# Patient Record
Sex: Female | Born: 1990 | Race: Asian | Hispanic: No | Marital: Married | Smoking: Never smoker
Health system: Southern US, Community
[De-identification: ages and names within clinical notes are randomized; demographics above are authoritative.]

## PROBLEM LIST (undated history)

## (undated) DIAGNOSIS — Z8632 Personal history of gestational diabetes: Secondary | ICD-10-CM

## (undated) DIAGNOSIS — Z789 Other specified health status: Secondary | ICD-10-CM

## (undated) DIAGNOSIS — O24419 Gestational diabetes mellitus in pregnancy, unspecified control: Secondary | ICD-10-CM

## (undated) HISTORY — DX: Personal history of gestational diabetes: Z86.32

## (undated) HISTORY — PX: NO PAST SURGERIES: SHX2092

## (undated) HISTORY — DX: Gestational diabetes mellitus in pregnancy, unspecified control: O24.419

## (undated) HISTORY — DX: Other specified health status: Z78.9

---

## 2015-06-03 LAB — OB RESULTS CONSOLE HIV ANTIBODY (ROUTINE TESTING): HIV: NONREACTIVE

## 2015-06-03 LAB — OB RESULTS CONSOLE ANTIBODY SCREEN: ANTIBODY SCREEN: NEGATIVE

## 2015-06-03 LAB — OB RESULTS CONSOLE HGB/HCT, BLOOD
HEMATOCRIT: 38 %
HEMOGLOBIN: 12.1 g/dL

## 2015-06-03 LAB — OB RESULTS CONSOLE ABO/RH: RH Type: POSITIVE

## 2015-06-03 LAB — OB RESULTS CONSOLE RUBELLA ANTIBODY, IGM: Rubella: NON-IMMUNE/NOT IMMUNE

## 2015-06-03 LAB — OB RESULTS CONSOLE PLATELET COUNT: Platelets: 235 10*3/uL

## 2015-06-03 LAB — OB RESULTS CONSOLE RPR: RPR: NONREACTIVE

## 2015-06-03 LAB — OB RESULTS CONSOLE VARICELLA ZOSTER ANTIBODY, IGG: Varicella: NON-IMMUNE/NOT IMMUNE

## 2015-06-03 LAB — OB RESULTS CONSOLE HEPATITIS B SURFACE ANTIGEN: Hepatitis B Surface Ag: NEGATIVE

## 2015-06-03 LAB — OB RESULTS CONSOLE GC/CHLAMYDIA
Chlamydia: NEGATIVE
Gonorrhea: NEGATIVE

## 2015-07-12 ENCOUNTER — Encounter (HOSPITAL_COMMUNITY): Payer: Self-pay

## 2015-07-12 ENCOUNTER — Other Ambulatory Visit (HOSPITAL_COMMUNITY): Payer: Self-pay | Admitting: Nurse Practitioner

## 2015-07-12 ENCOUNTER — Ambulatory Visit (HOSPITAL_COMMUNITY)
Admission: RE | Admit: 2015-07-12 | Discharge: 2015-07-12 | Disposition: A | Discharge status: Home / Self Care | Payer: Medicaid Other | Source: Ambulatory Visit | Attending: Physician Assistant | Admitting: Physician Assistant

## 2015-07-12 DIAGNOSIS — O351XX0 Maternal care for (suspected) chromosomal abnormality in fetus, not applicable or unspecified: Secondary | ICD-10-CM | POA: Insufficient documentation

## 2015-07-12 DIAGNOSIS — Z3A19 19 weeks gestation of pregnancy: Secondary | ICD-10-CM

## 2015-07-12 DIAGNOSIS — Z315 Encounter for genetic counseling: Secondary | ICD-10-CM | POA: Insufficient documentation

## 2015-07-12 DIAGNOSIS — O283 Abnormal ultrasonic finding on antenatal screening of mother: Secondary | ICD-10-CM | POA: Diagnosis not present

## 2015-07-12 DIAGNOSIS — Z3689 Encounter for other specified antenatal screening: Secondary | ICD-10-CM

## 2015-07-12 DIAGNOSIS — Z843 Family history of consanguinity: Secondary | ICD-10-CM

## 2015-07-12 DIAGNOSIS — IMO0002 Reserved for concepts with insufficient information to code with codable children: Secondary | ICD-10-CM | POA: Insufficient documentation

## 2015-07-12 NOTE — Progress Notes (Signed)
Genetic Counseling  High-Risk Gestation Note  Appointment Date:  07/12/2015 Referred By: Quentin Mulling, PA-C Date of Birth:  1991-04-20   Pregnancy History: G1P0 Estimated Date of Delivery: 12/05/15 Estimated Gestational Age: [redacted]w[redacted]d Attending: Alpha Gula, MD   Ms. [employer] and her partner were seen for genetic counseling because of the ultrasound finding of increased nuchal fold measurement on today's ultrasound.   In Summary:   Nuchal fold measurement increased on today's ultrasound (7.3 mm)  Discussed this is a soft marker for Down syndrome  Reviewed age-related risks for fetal aneuploidy and previous Quad screen for patient, which indicated 1 in 6,369 DSR  Adjusted Down syndrome risk is approximately 1 in 530.   Discussed screening and testing options  Patient declined NIPS and amniocentesis  Family history significant for consanguinity (second cousins); patient declined expanded carrier screening panel today  Follow-up ultrasound scheduled for 08/09/15  Targeted ultrasound was performed today. Ultrasound revealed an increased nuchal fold measurement (7.3 mm at [redacted]w[redacted]d gestation). Additionally, the left thumb was visualized to be hyperextended on today's ultrasound. The ultrasound report will be sent under separate cover.  Follow-up ultrasound is scheduled for 08/09/15.   We discussed that the second trimester genetic sonogram is targeted at identifying features associated with aneuploidy.  It has evolved as a screening tool used to provide an individualized risk assessment for Down syndrome and other trisomies.  The ability of sonography to aid in the detection of aneuploidies relies on identification of both major structural anomalies and "soft markers."  The patient was counseled that the latter term refers to findings that are often normal variants and do not cause any significant medical problems.  Nonetheless, these markers have a known association with aneuploidy.     The nuchal fold refers to the skin on the back of the fetal neck. A thick nuchal fold measurement is typically defined as greater than 5 mm at 15-[redacted] weeks gestation and greater than 6 mm at 18 through [redacted] weeks gestation. Approximately 1-2% of normal pregnancies will have a thick nuchal fold; therefore, most babies with this finding are born normal and healthy.  However, the presence of a thick nuchal fold increases the chance for Down syndrome in a pregnancy.  We discussed that this finding would increase the chance for Down syndrome above the patient's Quad screen risk of 1 in 6,369 to approximately 1 in 530 (0.2%).   We reviewed chromosomes, nondisjunction, and the common features and variable prognosis of Down syndrome.  We also reviewed other aneuploidies including trisomies 13 and 9; however, we discussed that these conditions are less likely given that the remainder of the fetal anatomy was wnl by ultrasound.  We reviewed other available screening and diagnostic options including noninvasive prenatal screening (NIPS)/cell free DNA (cfDNA) testing and amniocentesis.  She was counseled regarding the benefits and limitations of each option.  We reviewed the approximate 1 in 300-500 risk for complications for amniocentesis, including spontaneous pregnancy loss. After consideration of all the options, she declined NIPS and amniocentesis today, stating that she was comfortable with the risk assessment provided by Quad screen and ultrasound. She understands that these screens do not diagnose or rule out chromosome conditions.   Both family histories were reviewed and found to be contributory for a female maternal first cousin to the patient with congenital heart disease, and this relative died after a few months of life. Congenital heart defects occur in approximately 1% of pregnancies.  Congenital heart defects may occur due  to multifactorial influences, chromosomal abnormalities, genetic syndromes or  environmental exposures.  Isolated heart defects are generally multifactorial.  Given the reported family history and assuming multifactorial inheritance, the risk for a congenital heart defect in the current pregnancy does not appear to be increased above the general population risk.  Additionally, the father of the pregnancy reported a female paternal first cousin once removed who was described to not speak well due to not hearing well. He is currently 26 years old and reportedly recently started therapy, which is reported to improve his features. An underlying cause was not known by the patient at the time of today's visit for the hearing issues. We discussed that there can be various causes for hearing loss including environment, genetics, multifactorial, or sporadic. Given the degree of relation and reported family history, recurrence risk would likely be low for the current pregnancy. However, specific information regarding the etiology is needed to accurately assess recurrence risk for relatives.    The couple reported that they are maternal second cousins; the patient's maternal grandfather and the father of the pregnancy's maternal grandmother are full siblings. We discussed that children born to a consanguineous couple are at increased risk for genetic health problems.  This increase in risk is related to the possibility of passing on recessive genes. We explained that every person carries approximately 7-10 non-working genes that when received in a double dose results in recessive genetic conditions.  In general, unrelated couples have a relatively low risk of having a child with a recessive condition because the likelihood of both parents carrying the same non-working recessive gene is very low.  However, when a couple is related, they have inherited some of their genetic information from the same family member, which leads to an increased chance that they may carry the same recessive gene and have a  child with a recessive condition.  For second cousin unions, the risk to have a child with a birth defect, intellectual disability, or genetic condition is increased approximately 1% above the general population risk (3-5%).  We reviewed chromosomes, genes, and recessive inheritance in detail. Without further information regarding the provided family history, an accurate genetic risk cannot be calculated. Further genetic counseling is warranted if more information is obtained.  We discussed the option of expanded carrier screening, which provides carrier screening for common disease-causing mutations of greater than 200 mostly autosomal recessive conditions. We reviewed that the prevalence of each condition varies (and often varies with ethnicity). Thus the couples' background risk to be a carrier for each of these various conditions would range, and in some cases be very low or unknown. Similarly, the detection rate varies with each condition and also varies in some cases with ethnicity, ranging from greater than 99% (in the case of Hemoglobinopathies) to 11% to unknown. We reviewed that a negative carrier screen would thus reduce but not eliminate the chance to be a carrier for these conditions.  For some conditions included on this carrier screening panel, the pre-test carrier frequency and/or the detection rate is unknown. Thus, for some conditions included on the carrier screening panel, the exact reduction of risk with a negative carrier screening result may not be able to be quantified. We reviewed that in the event that one partner is found to be a carrier for one or more conditions, carrier screening would be available to the partner for those conditions. We discussed the risks, benefits, and limitations of carrier screening with the couple. After careful consideration, Ms.  Wan declined expanded carrier screening today.   Ms. Nedda Gains denied exposure to environmental toxins or chemical agents. She  denied the use of alcohol, tobacco or street drugs. She denied significant viral illnesses during the course of her pregnancy. Her medical and surgical histories were noncontributory.   I counseled this couple regarding the above risks and available options.  The approximate face-to-face time with the genetic counselor was 35 minutes.  Quinn Plowman, MS Certified Genetic Counselor 07/12/2015

## 2015-08-09 ENCOUNTER — Ambulatory Visit (HOSPITAL_COMMUNITY)
Admission: RE | Admit: 2015-08-09 | Discharge: 2015-08-09 | Disposition: A | Discharge status: Home / Self Care | Payer: Medicaid Other | Source: Ambulatory Visit | Attending: Physician Assistant | Admitting: Physician Assistant

## 2015-08-09 DIAGNOSIS — O283 Abnormal ultrasonic finding on antenatal screening of mother: Secondary | ICD-10-CM | POA: Diagnosis not present

## 2015-10-04 ENCOUNTER — Encounter: Payer: Self-pay | Admitting: *Deleted

## 2015-10-04 ENCOUNTER — Ambulatory Visit: Payer: Medicaid Other | Admitting: *Deleted

## 2015-10-04 ENCOUNTER — Encounter: Payer: Medicaid Other | Attending: Obstetrics and Gynecology | Admitting: *Deleted

## 2015-10-04 VITALS — Ht 63.0 in | Wt 135.2 lb

## 2015-10-04 DIAGNOSIS — Z2839 Other underimmunization status: Secondary | ICD-10-CM | POA: Insufficient documentation

## 2015-10-04 DIAGNOSIS — Z713 Dietary counseling and surveillance: Secondary | ICD-10-CM | POA: Diagnosis not present

## 2015-10-04 DIAGNOSIS — O24419 Gestational diabetes mellitus in pregnancy, unspecified control: Secondary | ICD-10-CM

## 2015-10-04 DIAGNOSIS — O9989 Other specified diseases and conditions complicating pregnancy, childbirth and the puerperium: Secondary | ICD-10-CM

## 2015-10-04 DIAGNOSIS — IMO0002 Reserved for concepts with insufficient information to code with codable children: Secondary | ICD-10-CM

## 2015-10-04 DIAGNOSIS — O0990 Supervision of high risk pregnancy, unspecified, unspecified trimester: Secondary | ICD-10-CM | POA: Insufficient documentation

## 2015-10-04 DIAGNOSIS — Z843 Family history of consanguinity: Secondary | ICD-10-CM

## 2015-10-04 DIAGNOSIS — O351XX0 Maternal care for (suspected) chromosomal abnormality in fetus, not applicable or unspecified: Secondary | ICD-10-CM

## 2015-10-04 DIAGNOSIS — O09899 Supervision of other high risk pregnancies, unspecified trimester: Secondary | ICD-10-CM | POA: Insufficient documentation

## 2015-10-04 DIAGNOSIS — Z283 Underimmunization status: Secondary | ICD-10-CM | POA: Insufficient documentation

## 2015-10-04 MED ORDER — ACCU-CHEK FASTCLIX LANCETS MISC: 1.0000 | Freq: Four times a day (QID) | Status: DC

## 2015-10-04 MED ORDER — GLUCOSE BLOOD VI STRP: ORAL_STRIP | Status: DC

## 2015-10-04 NOTE — Progress Notes (Signed)
Nutrition note: GDM diet education Pt was recently diagnosed with GDM. Pt was underwt prior to pregnancy (pregravid wt-87#). Pt has gained 48.2# @ 32w, which is > expected. Pt reports eating 3 meals & 6 snacks/d. Pt is taking a PNV. Pt reports no N&V or heartburn. NKFA. Pt reports walking for ~15 mins/d. Pt received verbal & written education on GDM diet. Discussed wt gain goals of 28-40# or 1#/wk. Pt agrees to follow GDM diet with 3 meals & 3 snacks/d with proper CHO/ protein combination. Pt has WIC & plans to BF. F/u in 4-6 wks Blondell RevealLaura Farron Watrous, MS, RD, LDN, [facility]CLC

## 2015-10-04 NOTE — Progress Notes (Signed)
  Patient was seen on 10/04/15 for Gestational Diabetes self-management . The following learning objectives were met by the patient :   States the definition of Gestational Diabetes  States when to check blood glucose levels  Demonstrates proper blood glucose monitoring techniques  States the effect of stress and exercise on blood glucose levels  States the importance of limiting caffeine and abstaining from alcohol and smoking  Plan:  Consider  increasing your activity level by walking daily as tolerated Begin checking BG before breakfast and 2 hours after first bit of breakfast, lunch and dinner after  as directed by MD  Take medication  as directed by MD  Blood glucose monitor given: Georgeanne Nim Lot # [id number] Exp: 2016/07/15 Blood glucose reading: 2hpp 109m/dl  Patient instructed to monitor glucose levels: FBS: 60 - <90 2 hour: <120  Patient received the following handouts:  Nutrition Diabetes and Pregnancy  Carbohydrate Counting List  Meal Planning worksheet  Patient will be seen for follow-up as needed.

## 2015-10-06 ENCOUNTER — Other Ambulatory Visit: Payer: Self-pay | Admitting: General Practice

## 2015-10-06 ENCOUNTER — Telehealth: Payer: Self-pay | Admitting: General Practice

## 2015-10-06 DIAGNOSIS — O24419 Gestational diabetes mellitus in pregnancy, unspecified control: Secondary | ICD-10-CM

## 2015-10-06 MED ORDER — GLUCOSE BLOOD VI STRP: ORAL_STRIP | Status: DC

## 2015-10-06 MED ORDER — ACCU-CHEK FASTCLIX LANCETS MISC: 1.0000 | Freq: Four times a day (QID) | Status: DC

## 2015-10-06 NOTE — Telephone Encounter (Signed)
Patient called and left message stating she is calling about the machine we gave her. Patient states she is all out of lancets and strips and they are no prescriptions at [facility]. Per chart review, no pharmacy on file. Called patient, no answer- left message stating we are trying to reach you to return your phone call. Please call us back and let us know what pharmacy you use.

## 2015-10-06 NOTE — Telephone Encounter (Signed)
Patient called and left message stating [facility] on [address] and needs her needles and strips sent there. meds reordered there. Called patient and informed her of prescriptions sent to pharmacy. Patient verbalized understanding & had no questions

## 2015-10-13 ENCOUNTER — Encounter: Payer: Self-pay | Admitting: Obstetrics and Gynecology

## 2015-10-13 ENCOUNTER — Ambulatory Visit (INDEPENDENT_AMBULATORY_CARE_PROVIDER_SITE_OTHER): Payer: Medicaid Other | Admitting: Obstetrics and Gynecology

## 2015-10-13 VITALS — BP 106/61 | HR 94 | Temp 97.8°F | Wt 141.0 lb

## 2015-10-13 DIAGNOSIS — O24419 Gestational diabetes mellitus in pregnancy, unspecified control: Secondary | ICD-10-CM

## 2015-10-13 DIAGNOSIS — O351XX0 Maternal care for (suspected) chromosomal abnormality in fetus, not applicable or unspecified: Secondary | ICD-10-CM

## 2015-10-13 DIAGNOSIS — O0990 Supervision of high risk pregnancy, unspecified, unspecified trimester: Secondary | ICD-10-CM

## 2015-10-13 DIAGNOSIS — IMO0002 Reserved for concepts with insufficient information to code with codable children: Secondary | ICD-10-CM

## 2015-10-13 DIAGNOSIS — Z843 Family history of consanguinity: Secondary | ICD-10-CM

## 2015-10-13 LAB — POCT URINALYSIS DIP (DEVICE)
Bilirubin Urine: NEGATIVE
Glucose, UA: NEGATIVE mg/dL
KETONES UR: NEGATIVE mg/dL
Nitrite: NEGATIVE
Protein, ur: NEGATIVE mg/dL
SPECIFIC GRAVITY, URINE: 1.02 (ref 1.005–1.030)
UROBILINOGEN UA: 0.2 mg/dL (ref 0.0–1.0)
pH: 7 (ref 5.0–8.0)

## 2015-10-13 MED ORDER — GLYBURIDE 2.5 MG PO TABS: 1.2500 mg | ORAL_TABLET | Freq: Every day | ORAL | Status: DC

## 2015-10-13 NOTE — Progress Notes (Signed)
Subjective:  Maureen Cameron is a 24 y.o. G1P0000 at 3125w3d being seen today for initial. prenatal care. [facility] Failed 3 hour. No questions or concerns.  Fastings are less than 90. 16 of 24 2 hour PPs are > 120. 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  Patient reports no complaints.  Contractions: Not present. Vag. Bleeding: None.  Movement: Present. Denies leaking of fluid.   The following portions of the patient's history were reviewed and updated as appropriate: allergies, current medications, past family history, past medical history, past social history, past surgical history and problem list. Problem list updated.  Objective:   Filed Vitals:   10/13/15 0800  BP: 106/61  Pulse: 94  Temp: 97.8 F (36.6 C)  Weight: 141 lb (63.957 kg)    Fetal Status: Fetal Heart Rate (bpm): 148 Fundal Height: 33 cm Movement: Present     General:  Alert, oriented and cooperative. Patient is in no acute distress.  Skin: Skin is warm and dry. No rash noted.   Cardiovascular: Normal heart rate noted  Respiratory: Normal respiratory effort, no problems with respiration noted  Abdomen: Soft, gravid, appropriate for gestational age. Pain/Pressure: Absent     Pelvic: Vag. Bleeding: None     Cervical exam deferred        Extremities: Normal range of motion.  Edema: Trace  Mental Status: Normal mood and affect. Normal behavior. Normal judgment and thought content.   Urinalysis:      Assessment and Plan:  Pregnancy: G1P0000 at 7425w3d  #A2GDM - at dx today for elevated PPs - starting glyburide 1.25 mg po qd - 2x weekly nst starting today - diabetes educator 1 week - HR OB 2 weeks   Preterm labor symptoms and general obstetric precautions  including but not limited to vaginal bleeding, contractions, leaking of fluid and fetal movement were reviewed in detail with the patient. Please refer to After Visit Summary for other counseling recommendations.    Kathrynn RunningNoah Bedford Miu Chiong, MD

## 2015-10-18 ENCOUNTER — Encounter: Payer: Medicaid Other | Attending: Obstetrics and Gynecology | Admitting: *Deleted

## 2015-10-18 ENCOUNTER — Ambulatory Visit (INDEPENDENT_AMBULATORY_CARE_PROVIDER_SITE_OTHER): Payer: Medicaid Other | Admitting: Obstetrics & Gynecology

## 2015-10-18 VITALS — BP 111/71 | HR 89 | Wt 142.8 lb

## 2015-10-18 DIAGNOSIS — Z36 Encounter for antenatal screening of mother: Secondary | ICD-10-CM

## 2015-10-18 DIAGNOSIS — O24419 Gestational diabetes mellitus in pregnancy, unspecified control: Secondary | ICD-10-CM | POA: Diagnosis not present

## 2015-10-18 DIAGNOSIS — O0990 Supervision of high risk pregnancy, unspecified, unspecified trimester: Secondary | ICD-10-CM

## 2015-10-18 DIAGNOSIS — Z713 Dietary counseling and surveillance: Secondary | ICD-10-CM | POA: Diagnosis not present

## 2015-10-18 MED ORDER — GLYBURIDE 2.5 MG PO TABS: ORAL_TABLET | ORAL | Status: DC

## 2015-10-18 NOTE — Progress Notes (Signed)
Patient presents for review of glucose readings. I reviewed the patient log and glucometer for validity. For the past seven days 15:28 glucose readings were documented incorrectly. FBS readings were all valid with range from 73-85mg /dl. 2hpp readings are 69-200. All documented readings were less than the actual glucometer reading. The patient had changed the administration time of her glyburide from breakfast to lunch. Patient was not having AM snack and was symptomatic at noon. I have reviewed the testing and documentation process. I also reviewed her dietary intake to find she is drinking much [facility]. Plan: Glyburide with breakfast [place] 3 meals a day + snacks Always have Protein with your meals and snacks No fruit juice Test Fasting - befor breakfast ideal readings 60-90mg /dl Test: 2 hours after first bite of breakfast, lunch and dinner with an ultimate goal of 120mg /dl  Glucose readings to be reviewed this Friday and again Monday at OV.

## 2015-10-18 NOTE — Progress Notes (Signed)
NST reactive  Subjective: had sx of low blood sugar when she took med at breakfast, switched to noontime with improvement  Maureen Cameron is a 25 y.o. G1P0000 at 5074w1d being seen today for ongoing prenatal care.  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  Patient reports no contractions.  Contractions: Not present. Vag. Bleeding: None.  Movement: Present. Denies leaking of fluid.   The following portions of the patient's history were reviewed and updated as appropriate: allergies, current medications, past family history, past medical history, past social history, past surgical history and problem list. Problem list updated.  Objective:   Filed Vitals:   10/18/15 1123  BP: 111/71  Pulse: 89  Weight: 142 lb 12.8 oz (64.774 kg)    Fetal Status:     Movement: Present     General:  Alert, oriented and cooperative. Patient is in no acute distress.  Skin: Skin is warm and dry. No rash noted.   Cardiovascular: Normal heart rate noted  Respiratory: Normal respiratory effort, no problems with respiration noted  Abdomen: Soft, gravid, appropriate for gestational age. Pain/Pressure: Absent     Pelvic: Vag. Bleeding: None     Cervical exam deferred        Extremities: Normal range of motion.  Edema: Trace  Mental Status: Normal mood and affect. Normal behavior. Normal judgment and thought content.   Urinalysis:      Assessment and Plan:  Pregnancy: G1P0000 at 1174w1d  1. Gestational diabetes mellitus (GDM), antepartum, gestational diabetes method of control unspecified FBS and PP within range - glyBURIDE (DIABETA) 2.5 MG tablet; Take 0.5 tablets daily with lunch  Dispense: 30 tablet; Refill: 3  2. Supervision of high-risk pregnancy, unspecified  trimester Good BG control taking her medication midday with continue for now but needs to review her diet - glyBURIDE (DIABETA) 2.5 MG tablet; Take 0.5 tablets daily with lunch  Dispense: 30 tablet; Refill: 3  Preterm labor symptoms and general obstetric precautions including but not limited to vaginal bleeding, contractions, leaking of fluid and fetal movement were reviewed in detail with the patient. Please refer to After Visit Summary for other counseling recommendations.  Return in about 1 week (around 10/25/2015).   Adam PhenixJames G Arnold, MD

## 2015-10-18 NOTE — Patient Instructions (Signed)

## 2015-10-18 NOTE — Progress Notes (Signed)
Pt states she has been having low CBG's 2hr after breakfast since starting Glyburide. She took the medication after lunch for the past 2 days and has not been having the problem of low blood sugar. Per brief review of diet, pt may not be getting enough calories @ breakfast.

## 2015-10-22 ENCOUNTER — Ambulatory Visit (INDEPENDENT_AMBULATORY_CARE_PROVIDER_SITE_OTHER): Payer: Medicaid Other | Admitting: *Deleted

## 2015-10-22 VITALS — BP 117/71 | HR 90

## 2015-10-22 DIAGNOSIS — O24419 Gestational diabetes mellitus in pregnancy, unspecified control: Secondary | ICD-10-CM

## 2015-10-22 DIAGNOSIS — O0990 Supervision of high risk pregnancy, unspecified, unspecified trimester: Secondary | ICD-10-CM

## 2015-10-22 MED ORDER — GLYBURIDE 2.5 MG PO TABS: ORAL_TABLET | ORAL | Status: DC

## 2015-10-22 NOTE — Progress Notes (Signed)
10/22/2015- NST reviewed and reactive

## 2015-10-25 ENCOUNTER — Ambulatory Visit (INDEPENDENT_AMBULATORY_CARE_PROVIDER_SITE_OTHER): Payer: Medicaid Other | Admitting: Obstetrics & Gynecology

## 2015-10-25 VITALS — BP 112/69 | HR 97 | Wt 144.5 lb

## 2015-10-25 DIAGNOSIS — O24419 Gestational diabetes mellitus in pregnancy, unspecified control: Secondary | ICD-10-CM

## 2015-10-25 DIAGNOSIS — Z36 Encounter for antenatal screening of mother: Secondary | ICD-10-CM

## 2015-10-25 DIAGNOSIS — O0993 Supervision of high risk pregnancy, unspecified, third trimester: Secondary | ICD-10-CM

## 2015-10-25 LAB — POCT URINALYSIS DIP (DEVICE)
Bilirubin Urine: NEGATIVE
GLUCOSE, UA: NEGATIVE mg/dL
Ketones, ur: NEGATIVE mg/dL
NITRITE: NEGATIVE
Protein, ur: NEGATIVE mg/dL
Specific Gravity, Urine: 1.015 (ref 1.005–1.030)
UROBILINOGEN UA: 0.2 mg/dL (ref 0.0–1.0)
pH: 7 (ref 5.0–8.0)

## 2015-10-25 NOTE — Progress Notes (Signed)
Subjective:  Maureen Cameron is a 25 y.o. G1P0000 at 7331w1d being seen today for ongoing prenatal care.  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  Patient reports no complaints.  Contractions: Not present. Vag. Bleeding: None.  Movement: Present. Denies leaking of fluid.   The following portions of the patient's history were reviewed and updated as appropriate: allergies, current medications, past family history, past medical history, past social history, past surgical history and problem list. Problem list updated.  Objective:   Filed Vitals:   10/25/15 1023  BP: 112/69  Pulse: 97  Weight: 144 lb 8 oz (65.545 kg)    Fetal Status: Fetal Heart Rate (bpm): NST   Movement: Present  Presentation: Vertex  General:  Alert, oriented and cooperative. Patient is in no acute distress.  Skin: Skin is warm and dry. No rash noted.   Cardiovascular: Normal heart rate noted  Respiratory: Normal respiratory effort, no problems with respiration noted  Abdomen: Soft, gravid, appropriate for gestational age. Pain/Pressure: Absent     Pelvic: Vag. Bleeding: None     Cervical exam deferred        Extremities: Normal range of motion.  Edema: Trace  Mental Status: Normal mood and affect. Normal behavior. Normal judgment and thought content.   Urinalysis: Urine Protein: Negative Urine Glucose: Negative  Assessment and Plan:  Pregnancy: G1P0000 at 1131w1d  1. Gestational diabetes mellitus (GDM), antepartum, gestational diabetes method of control unspecified All fastings and pp breakfast are nml;  Pp lunch and dinner are about 50% elevated.  Given pt had problems with hypoglycemia recently, will hold off of increasing Glyburide.  Will see in one week. - US MFM OB  FOLLOW UP; Future - Amniotic fluid index with NST -Pt needs to pick pediatrician. -GBS next visit or 2  Preterm labor symptoms and general obstetric precautions including but not limited to vaginal bleeding, contractions, leaking of fluid and fetal movement were reviewed in detail with the patient. Please refer to After Visit Summary for other counseling recommendations.  Return in about 4 days (around 10/29/2015) for 2x/wk as scheduled.   Lesly DukesKelly H Melvenia Favela, MD

## 2015-10-25 NOTE — Progress Notes (Signed)
Breastfeeding tip of the week reviewed. US for growth scheduled 2/6 (38 wks)

## 2015-10-29 ENCOUNTER — Ambulatory Visit (INDEPENDENT_AMBULATORY_CARE_PROVIDER_SITE_OTHER): Payer: Medicaid Other | Admitting: *Deleted

## 2015-10-29 VITALS — BP 117/74 | HR 94

## 2015-10-29 DIAGNOSIS — O24419 Gestational diabetes mellitus in pregnancy, unspecified control: Secondary | ICD-10-CM | POA: Diagnosis present

## 2015-10-29 NOTE — Progress Notes (Signed)
NST reactive.

## 2015-11-01 ENCOUNTER — Other Ambulatory Visit (HOSPITAL_COMMUNITY)
Admission: RE | Admit: 2015-11-01 | Discharge: 2015-11-01 | Disposition: A | Discharge status: Home / Self Care | Payer: Medicaid Other | Source: Ambulatory Visit | Attending: Obstetrics and Gynecology | Admitting: Obstetrics and Gynecology

## 2015-11-01 ENCOUNTER — Ambulatory Visit (INDEPENDENT_AMBULATORY_CARE_PROVIDER_SITE_OTHER): Payer: Medicaid Other | Admitting: Obstetrics and Gynecology

## 2015-11-01 VITALS — BP 105/72 | HR 108 | Wt 145.9 lb

## 2015-11-01 DIAGNOSIS — O26893 Other specified pregnancy related conditions, third trimester: Secondary | ICD-10-CM | POA: Diagnosis not present

## 2015-11-01 DIAGNOSIS — O24419 Gestational diabetes mellitus in pregnancy, unspecified control: Secondary | ICD-10-CM

## 2015-11-01 DIAGNOSIS — O0990 Supervision of high risk pregnancy, unspecified, unspecified trimester: Secondary | ICD-10-CM

## 2015-11-01 DIAGNOSIS — Z113 Encounter for screening for infections with a predominantly sexual mode of transmission: Secondary | ICD-10-CM | POA: Diagnosis not present

## 2015-11-01 DIAGNOSIS — Z36 Encounter for antenatal screening of mother: Secondary | ICD-10-CM

## 2015-11-01 DIAGNOSIS — N898 Other specified noninflammatory disorders of vagina: Secondary | ICD-10-CM | POA: Diagnosis not present

## 2015-11-01 LAB — POCT URINALYSIS DIP (DEVICE)
BILIRUBIN URINE: NEGATIVE
Glucose, UA: NEGATIVE mg/dL
Ketones, ur: NEGATIVE mg/dL
NITRITE: NEGATIVE
PH: 7 (ref 5.0–8.0)
PROTEIN: 30 mg/dL — AB
Specific Gravity, Urine: 1.02 (ref 1.005–1.030)
UROBILINOGEN UA: 0.2 mg/dL (ref 0.0–1.0)

## 2015-11-01 LAB — OB RESULTS CONSOLE GBS: STREP GROUP B AG: NEGATIVE

## 2015-11-01 MED ORDER — GLYBURIDE 2.5 MG PO TABS: ORAL_TABLET | ORAL | Status: DC

## 2015-11-01 NOTE — Progress Notes (Signed)
Subjective:  Maureen Cameron is a 25 y.o. G1P0000 at 648w1d being seen today for ongoing prenatal care.  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  Patient reports no complaints.  Contractions: Not present. Vag. Bleeding: None.  Movement: Present. Denies leaking of fluid.   fastings less than 90. Approximately half of recent PPs are 120 or higher.   The following portions of the patient's history were reviewed and updated as appropriate: allergies, current medications, past family history, past medical history, past social history, past surgical history and problem list. Problem list updated.  Objective:   Filed Vitals:   11/01/15 1001  BP: 105/72  Pulse: 108  Weight: 145 lb 14.4 oz (66.18 kg)    Fetal Status: Fetal Heart Rate (bpm): NST Fundal Height: 34 cm Movement: Present  Presentation: Vertex  General:  Alert, oriented and cooperative. Patient is in no acute distress.  Skin: Skin is warm and dry. No rash noted.   Cardiovascular: Normal heart rate noted  Respiratory: Normal respiratory effort, no problems with respiration noted  Abdomen: Soft, gravid, appropriate for gestational age. Pain/Pressure: Absent     Pelvic: Vag. Bleeding: None     Cervical exam deferred        Extremities: Normal range of motion.  Edema: Trace  Mental Status: Normal mood and affect. Normal behavior. Normal judgment and thought content.   Urinalysis: Urine Protein: 1+ Urine Glucose: Negative  Assessment and Plan:  Pregnancy: G1P0000 at [redacted]w[redacted]d  1. Gestational diabetes mellitus (GDM), antepartum, gestational diabetes method of control unspecified - increase glyburide to 1 tab daily, hypoglycemia precautions discussed - Amniotic fluid index with NST, reactive,  continue twice weekly testing - GBS, g/c today  Preterm labor symptoms and general obstetric precautions including but not limited to vaginal bleeding, contractions, leaking of fluid and fetal movement were reviewed in detail with the patient. Please refer to After Visit Summary for other counseling recommendations.  F/u this week nst,  Kathrynn RunningNoah Bedford Mihaela Fajardo, MD

## 2015-11-01 NOTE — Progress Notes (Signed)
Large leukocytes on udip.  Breastfeeding tip of the week reviewed.  US for growth scheduled 2/6.

## 2015-11-02 LAB — WET PREP, GENITAL
Clue Cells Wet Prep HPF POC: NONE SEEN
Trich, Wet Prep: NONE SEEN

## 2015-11-02 LAB — GC/CHLAMYDIA PROBE AMP (~~LOC~~) NOT AT ARMC
CHLAMYDIA, DNA PROBE: NEGATIVE
NEISSERIA GONORRHEA: NEGATIVE

## 2015-11-03 LAB — CULTURE, BETA STREP (GROUP B ONLY)

## 2015-11-05 ENCOUNTER — Ambulatory Visit (INDEPENDENT_AMBULATORY_CARE_PROVIDER_SITE_OTHER): Payer: Medicaid Other | Admitting: *Deleted

## 2015-11-05 VITALS — BP 110/68 | HR 87

## 2015-11-05 DIAGNOSIS — O24419 Gestational diabetes mellitus in pregnancy, unspecified control: Secondary | ICD-10-CM

## 2015-11-05 NOTE — Progress Notes (Signed)
11/05/2015 NST reviewed and reactive 

## 2015-11-08 ENCOUNTER — Ambulatory Visit (INDEPENDENT_AMBULATORY_CARE_PROVIDER_SITE_OTHER): Payer: Medicaid Other | Admitting: Family Medicine

## 2015-11-08 VITALS — BP 106/74 | HR 96 | Wt 150.7 lb

## 2015-11-08 DIAGNOSIS — Z36 Encounter for antenatal screening of mother: Secondary | ICD-10-CM | POA: Diagnosis not present

## 2015-11-08 DIAGNOSIS — Z843 Family history of consanguinity: Secondary | ICD-10-CM

## 2015-11-08 DIAGNOSIS — O0993 Supervision of high risk pregnancy, unspecified, third trimester: Secondary | ICD-10-CM

## 2015-11-08 DIAGNOSIS — IMO0002 Reserved for concepts with insufficient information to code with codable children: Secondary | ICD-10-CM

## 2015-11-08 DIAGNOSIS — O24419 Gestational diabetes mellitus in pregnancy, unspecified control: Secondary | ICD-10-CM | POA: Diagnosis not present

## 2015-11-08 DIAGNOSIS — O351XX0 Maternal care for (suspected) chromosomal abnormality in fetus, not applicable or unspecified: Secondary | ICD-10-CM | POA: Diagnosis not present

## 2015-11-08 LAB — POCT URINALYSIS DIP (DEVICE)
Glucose, UA: NEGATIVE mg/dL
Ketones, ur: NEGATIVE mg/dL
Nitrite: NEGATIVE
Protein, ur: 30 mg/dL — AB
SPECIFIC GRAVITY, URINE: 1.025 (ref 1.005–1.030)
UROBILINOGEN UA: 0.2 mg/dL (ref 0.0–1.0)
pH: 6.5 (ref 5.0–8.0)

## 2015-11-08 NOTE — Progress Notes (Signed)
Subjective:  Maureen Cameron is a 25 y.o. G1P0000 at [redacted]w[redacted]d being seen today for ongoing prenatal care.  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  Patient reports no complaints.  Contractions: Not present. Vag. Bleeding: None.  Movement: Present. Denies leaking of fluid.   Fasting: 66-76 Bfast: 91-133, only 1 out of 9 that was above 120 Lunch: 65-158--> 3 of 7 out of range Dinner: 102-140--> 6 of 7 above 120 The following portions of the patient's history were reviewed and updated as appropriate: allergies, current medications, past family history, past medical history, past social history, past surgical history and problem list. Problem list updated.  Objective:   Filed Vitals:   11/08/15 1000  BP: 106/74  Pulse: 96  Weight: 150 lb 11.2 oz (68.357 kg)    Fetal Status: Fetal Heart Rate (bpm): NST   Movement: Present  Presentation: Vertex  General:  Alert, oriented and cooperative. Patient is in no acute distress.  Skin: Skin is warm and dry. No rash noted.   Cardiovascular: Normal heart rate noted  Respiratory: Normal respiratory effort, no problems with respiration noted  Abdomen: Soft, gravid, appropriate for gestational age. Pain/Pressure: Present     Pelvic: Vag. Bleeding: None     Cervical exam deferred        Extremities: Normal range of motion.  Edema: Mild pitting, slight indentation  Mental Status: Normal mood and affect. Normal behavior. Normal judgment and thought content.   Urinalysis: Urine Protein: 1+ Urine Glucose: Negative  Assessment and Plan:  Pregnancy: G1P0000 at [redacted]w[redacted]d  1. Gestational diabetes mellitus (GDM), antepartum, gestational diabetes method of control unspecified - Poorly controlled Lunch and Dinner  postprandial, discussed taking glyburide at lunch to cover postprandial excursions - Amniotic fluid index with NST  2. Consanguinity - declined testing  3. 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 Supervision of high-risk pregnancy, third trimester GBS neg Updated box  Preterm labor symptoms and general obstetric precautions including but not limited to vaginal bleeding, contractions, leaking of fluid and fetal movement were reviewed in detail with the patient. Please refer to After Visit Summary for other counseling recommendations.  Return in about 4 days (around 11/12/2015) for 2x/wk as scheduled.  Future Appointments Date Time Provider Department Center  11/12/2015 9:00 AM WOC-WOCA NST WOC-WOCA WOC  11/15/2015 9:30 AM WOC-WOCA NST WOC-WOCA WOC  11/22/2015 9:00 AM WH-MFC [place] 1 WH-US 203  11/22/2015 10:30 AM WOC-WOCA NST WOC-WOCA WOC    Federico Flake, MD

## 2015-11-08 NOTE — Progress Notes (Signed)
US for growth scheduled 2/6 

## 2015-11-08 NOTE — Patient Instructions (Signed)

## 2015-11-12 ENCOUNTER — Ambulatory Visit (INDEPENDENT_AMBULATORY_CARE_PROVIDER_SITE_OTHER): Payer: Medicaid Other | Admitting: *Deleted

## 2015-11-12 VITALS — BP 110/66 | HR 99

## 2015-11-12 DIAGNOSIS — O24419 Gestational diabetes mellitus in pregnancy, unspecified control: Secondary | ICD-10-CM | POA: Diagnosis not present

## 2015-11-15 ENCOUNTER — Encounter: Payer: Self-pay | Admitting: Obstetrics and Gynecology

## 2015-11-15 ENCOUNTER — Ambulatory Visit (INDEPENDENT_AMBULATORY_CARE_PROVIDER_SITE_OTHER): Payer: Medicaid Other | Admitting: Obstetrics and Gynecology

## 2015-11-15 VITALS — BP 116/77 | HR 94 | Wt 148.4 lb

## 2015-11-15 DIAGNOSIS — O0993 Supervision of high risk pregnancy, unspecified, third trimester: Secondary | ICD-10-CM

## 2015-11-15 DIAGNOSIS — Z36 Encounter for antenatal screening of mother: Secondary | ICD-10-CM

## 2015-11-15 DIAGNOSIS — Z843 Family history of consanguinity: Secondary | ICD-10-CM | POA: Diagnosis not present

## 2015-11-15 DIAGNOSIS — O24415 Gestational diabetes mellitus in pregnancy, controlled by oral hypoglycemic drugs: Secondary | ICD-10-CM | POA: Diagnosis present

## 2015-11-15 LAB — POCT URINALYSIS DIP (DEVICE)
Bilirubin Urine: NEGATIVE
Glucose, UA: NEGATIVE mg/dL
Ketones, ur: NEGATIVE mg/dL
Nitrite: NEGATIVE
PROTEIN: 30 mg/dL — AB
SPECIFIC GRAVITY, URINE: 1.02 (ref 1.005–1.030)
UROBILINOGEN UA: 0.2 mg/dL (ref 0.0–1.0)
pH: 7 (ref 5.0–8.0)

## 2015-11-15 NOTE — Progress Notes (Signed)
IOL scheduled for 11/28/2015@7 :30 AM

## 2015-11-15 NOTE — Progress Notes (Signed)
Subjective:  Maureen Cameron is a 25 y.o. G1P0000 at [redacted]w[redacted]d being seen today for ongoing prenatal care.  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  Patient reports no complaints.  Contractions: Irregular. Vag. Bleeding: None.  Movement: Present. Denies leaking of fluid.   The following portions of the patient's history were reviewed and updated as appropriate: allergies, current medications, past family history, past medical history, past social history, past surgical history and problem list. Problem list updated.  Objective:   Filed Vitals:   11/15/15 0954  BP: 116/77  Pulse: 94  Weight: 148 lb 6.4 oz (67.314 kg)    Fetal Status: Fetal Heart Rate (bpm): NST   Movement: Present     General:  Alert, oriented and cooperative. Patient is in no acute distress.  Skin: Skin is warm and dry. No rash noted.   Cardiovascular: Normal heart rate noted  Respiratory: Normal respiratory effort, no problems with respiration noted  Abdomen: Soft, gravid, appropriate for gestational age. Pain/Pressure: Present     Pelvic: Vag. Bleeding: None     Cervical exam deferred        Extremities: Normal range of motion.  Edema: Mild pitting, slight indentation  Mental Status: Normal mood and affect. Normal behavior. Normal judgment and thought content.   Urinalysis:      Assessment and Plan:  Pregnancy: G1P0000 at [redacted]w[redacted]d  1. Gestational diabetes mellitus (GDM) controlled on oral hypoglycemic drug, antepartum CBGs better control 1 abnormal post lunch value of 139 and 1 abnormal post dinner 134 Continue current glyburide regimen NST reviewed and reactive Growth ultrasound scheduled on 11/22/2015 Will scheduled IOL at 39 weeks on 11/28/2015  2. Supervision of  high-risk pregnancy, third trimester   3. Consanguinity   Term labor symptoms and general obstetric precautions including but not limited to vaginal bleeding, contractions, leaking of fluid and fetal movement were reviewed in detail with the patient. Please refer to After Visit Summary for other counseling recommendations.  Return in about 4 days (around 11/19/2015) for 2x/wk as scheduled.   Catalina Antigua, MD

## 2015-11-15 NOTE — Progress Notes (Signed)
Breastfeeding tip of the week reviewed.  [place] for growth on 2/6.

## 2015-11-15 NOTE — Addendum Note (Signed)
Addended by: Jill Side on: 11/15/2015 01:59 PM   Modules accepted: Orders

## 2015-11-16 ENCOUNTER — Telehealth: Payer: Self-pay

## 2015-11-16 LAB — CULTURE, OB URINE: Colony Count: 6000

## 2015-11-16 NOTE — Telephone Encounter (Signed)
Pt called stating she was having some cramping and has noticed a pinkish discharge in her panties this am. Her pain level is a score now. Advised patient to try and wait it out if she notices bright red blood or pain starts to increase over the course of the day she should call us or she can come to [facility].

## 2015-11-17 ENCOUNTER — Telehealth: Payer: Self-pay | Admitting: *Deleted

## 2015-11-17 ENCOUNTER — Telehealth (HOSPITAL_COMMUNITY): Payer: Self-pay | Admitting: *Deleted

## 2015-11-17 ENCOUNTER — Encounter (HOSPITAL_COMMUNITY): Payer: Self-pay | Admitting: *Deleted

## 2015-11-17 NOTE — Telephone Encounter (Signed)
Pt called and stated that she is having some pinkish brown spotting. Wants to know if this is normal. Called patient back and she stated that she is having some very light spotting that comes and goes. Baby moves well and no contractions. Pt states that she is seeing some blood tinged mucus but no period like bleeding. Advised patient to keep an eye on her symptoms and if she develops regular contractions, decreased fetal movement or heavier bleeding to come to MAU immediately. Patient is agreeable to this and has no further questions.

## 2015-11-17 NOTE — Telephone Encounter (Signed)
Preadmission screen  

## 2015-11-18 ENCOUNTER — Inpatient Hospital Stay (HOSPITAL_COMMUNITY): Payer: Medicaid Other | Admitting: Anesthesiology

## 2015-11-18 ENCOUNTER — Encounter (HOSPITAL_COMMUNITY): Payer: Self-pay | Admitting: *Deleted

## 2015-11-18 ENCOUNTER — Inpatient Hospital Stay (HOSPITAL_COMMUNITY)
Admission: AD | Admit: 2015-11-18 | Discharge: 2015-11-22 | DRG: 765 | Disposition: A | Discharge status: Home / Self Care | Payer: Medicaid Other | Source: Ambulatory Visit | Attending: Family Medicine | Admitting: Family Medicine

## 2015-11-18 DIAGNOSIS — O324XX0 Maternal care for high head at term, not applicable or unspecified: Principal | ICD-10-CM | POA: Diagnosis present

## 2015-11-18 DIAGNOSIS — O411230 Chorioamnionitis, third trimester, not applicable or unspecified: Secondary | ICD-10-CM | POA: Diagnosis present

## 2015-11-18 DIAGNOSIS — O0993 Supervision of high risk pregnancy, unspecified, third trimester: Secondary | ICD-10-CM

## 2015-11-18 DIAGNOSIS — O24425 Gestational diabetes mellitus in childbirth, controlled by oral hypoglycemic drugs: Secondary | ICD-10-CM | POA: Diagnosis present

## 2015-11-18 DIAGNOSIS — IMO0002 Reserved for concepts with insufficient information to code with codable children: Secondary | ICD-10-CM

## 2015-11-18 DIAGNOSIS — O24415 Gestational diabetes mellitus in pregnancy, controlled by oral hypoglycemic drugs: Secondary | ICD-10-CM

## 2015-11-18 DIAGNOSIS — Z8632 Personal history of gestational diabetes: Secondary | ICD-10-CM

## 2015-11-18 DIAGNOSIS — Z283 Underimmunization status: Secondary | ICD-10-CM

## 2015-11-18 DIAGNOSIS — O9989 Other specified diseases and conditions complicating pregnancy, childbirth and the puerperium: Secondary | ICD-10-CM

## 2015-11-18 DIAGNOSIS — Z3A37 37 weeks gestation of pregnancy: Secondary | ICD-10-CM | POA: Diagnosis not present

## 2015-11-18 DIAGNOSIS — Z833 Family history of diabetes mellitus: Secondary | ICD-10-CM | POA: Diagnosis not present

## 2015-11-18 DIAGNOSIS — Z843 Family history of consanguinity: Secondary | ICD-10-CM

## 2015-11-18 DIAGNOSIS — Z2839 Other underimmunization status: Secondary | ICD-10-CM

## 2015-11-18 DIAGNOSIS — O09899 Supervision of other high risk pregnancies, unspecified trimester: Secondary | ICD-10-CM

## 2015-11-18 DIAGNOSIS — Z8249 Family history of ischemic heart disease and other diseases of the circulatory system: Secondary | ICD-10-CM | POA: Diagnosis not present

## 2015-11-18 DIAGNOSIS — O351XX0 Maternal care for (suspected) chromosomal abnormality in fetus, not applicable or unspecified: Secondary | ICD-10-CM

## 2015-11-18 HISTORY — DX: Personal history of gestational diabetes: Z86.32

## 2015-11-18 LAB — URINALYSIS, ROUTINE W REFLEX MICROSCOPIC
BILIRUBIN URINE: NEGATIVE
Glucose, UA: NEGATIVE mg/dL
KETONES UR: NEGATIVE mg/dL
NITRITE: NEGATIVE
Protein, ur: 100 mg/dL — AB
Specific Gravity, Urine: 1.025 (ref 1.005–1.030)
pH: 6.5 (ref 5.0–8.0)

## 2015-11-18 LAB — GLUCOSE, CAPILLARY: Glucose-Capillary: 135 mg/dL — ABNORMAL HIGH (ref 65–99)

## 2015-11-18 LAB — TYPE AND SCREEN
ABO/RH(D): AB POS
Antibody Screen: NEGATIVE

## 2015-11-18 LAB — URINE MICROSCOPIC-ADD ON

## 2015-11-18 LAB — CBC
HCT: 37.8 % (ref 36.0–46.0)
HEMOGLOBIN: 12.9 g/dL (ref 12.0–15.0)
MCH: 27.3 pg (ref 26.0–34.0)
MCHC: 34.1 g/dL (ref 30.0–36.0)
MCV: 79.9 fL (ref 78.0–100.0)
Platelets: 253 10*3/uL (ref 150–400)
RBC: 4.73 MIL/uL (ref 3.87–5.11)
RDW: 16 % — ABNORMAL HIGH (ref 11.5–15.5)
WBC: 9.4 10*3/uL (ref 4.0–10.5)

## 2015-11-18 LAB — ABO/RH: ABO/RH(D): AB POS

## 2015-11-18 MED ORDER — OXYTOCIN 10 UNIT/ML IJ SOLN
2.5000 [IU]/h | INTRAVENOUS | Status: DC
  Filled 2015-11-18: qty 4

## 2015-11-18 MED ORDER — ONDANSETRON HCL 4 MG/2ML IJ SOLN: 4.0000 mg | Freq: Four times a day (QID) | INTRAMUSCULAR | Status: DC | PRN

## 2015-11-18 MED ORDER — FENTANYL 2.5 MCG/ML BUPIVACAINE 1/10 % EPIDURAL INFUSION (WH - ANES)
INTRAMUSCULAR | Status: AC
  Administered 2015-11-18: 14 mL/h via EPIDURAL
  Filled 2015-11-18: qty 125

## 2015-11-18 MED ORDER — LIDOCAINE HCL (PF) 1 % IJ SOLN
INTRAMUSCULAR | Status: DC | PRN
  Administered 2015-11-18: 4 mL
  Administered 2015-11-18: 6 mL via EPIDURAL

## 2015-11-18 MED ORDER — ACETAMINOPHEN 325 MG PO TABS: 650.0000 mg | ORAL_TABLET | ORAL | Status: DC | PRN

## 2015-11-18 MED ORDER — OXYTOCIN BOLUS FROM INFUSION: 500.0000 mL | INTRAVENOUS | Status: DC

## 2015-11-18 MED ORDER — OXYCODONE-ACETAMINOPHEN 5-325 MG PO TABS: 2.0000 | ORAL_TABLET | ORAL | Status: DC | PRN

## 2015-11-18 MED ORDER — LACTATED RINGERS IV SOLN: 500.0000 mL | INTRAVENOUS | Status: DC | PRN

## 2015-11-18 MED ORDER — LACTATED RINGERS IV SOLN
INTRAVENOUS | Status: DC
  Administered 2015-11-19 (×3): via INTRAVENOUS

## 2015-11-18 MED ORDER — PHENYLEPHRINE 40 MCG/ML (10ML) SYRINGE FOR IV PUSH (FOR BLOOD PRESSURE SUPPORT): 80.0000 ug | PREFILLED_SYRINGE | INTRAVENOUS | Status: DC | PRN

## 2015-11-18 MED ORDER — DIPHENHYDRAMINE HCL 50 MG/ML IJ SOLN
12.5000 mg | INTRAMUSCULAR | Status: DC | PRN
  Administered 2015-11-18: 12.5 mg via INTRAVENOUS
  Filled 2015-11-18: qty 1

## 2015-11-18 MED ORDER — FENTANYL 2.5 MCG/ML BUPIVACAINE 1/10 % EPIDURAL INFUSION (WH - ANES)
14.0000 mL/h | INTRAMUSCULAR | Status: DC | PRN
  Administered 2015-11-18 – 2015-11-19 (×2): 14 mL/h via EPIDURAL
  Filled 2015-11-18: qty 125

## 2015-11-18 MED ORDER — OXYCODONE-ACETAMINOPHEN 5-325 MG PO TABS: 1.0000 | ORAL_TABLET | ORAL | Status: DC | PRN

## 2015-11-18 MED ORDER — CITRIC ACID-SODIUM CITRATE 334-500 MG/5ML PO SOLN
30.0000 mL | ORAL | Status: DC | PRN
  Administered 2015-11-19: 30 mL via ORAL
  Filled 2015-11-18: qty 15

## 2015-11-18 MED ORDER — LIDOCAINE HCL (PF) 1 % IJ SOLN
30.0000 mL | INTRAMUSCULAR | Status: DC | PRN
  Filled 2015-11-18: qty 30

## 2015-11-18 MED ORDER — PHENYLEPHRINE 40 MCG/ML (10ML) SYRINGE FOR IV PUSH (FOR BLOOD PRESSURE SUPPORT)
PREFILLED_SYRINGE | INTRAVENOUS | Status: AC
  Filled 2015-11-18: qty 20

## 2015-11-18 MED ORDER — EPHEDRINE 5 MG/ML INJ: 10.0000 mg | INTRAVENOUS | Status: DC | PRN

## 2015-11-18 NOTE — Progress Notes (Signed)
   Maureen Cameron is a 25 y.o. G1P0000 at [redacted]w[redacted]d  admitted for active labor  Subjective: Comfortable with epidural  Objective: Filed Vitals:   11/18/15 2030 11/18/15 2100 11/18/15 2131 11/18/15 2152  BP: 125/84 119/64 128/71   Pulse: 101 96 111   Temp:    98.6 F (37 C)  TempSrc:    Oral  Resp: BS 130 shortly after eating FHT:  FHR: 140 bpm, variability: moderate,  accelerations:  Present,  decelerations:  Absent UC:   irregular, every 1.5-4 minutes SVE:   Dilation: 7 Effacement (%): 100 Station: -2 Exam by:: F. Cresenzo-Dishmon AROM w/ clear fluid  Labs: Lab Results  Component Value Date   WBC 9.4 11/18/2015   HGB 12.9 11/18/2015   HCT 37.8 11/18/2015   MCV 79.9 11/18/2015   PLT 253 11/18/2015    Assessment / Plan: Spontaneous labor, progressing normally  Labor: Progressing normally Fetal Wellbeing:  Category I Pain Control:  Epidural Anticipated MOD:  NSVD  CRESENZO-DISHMAN,Alane Hanssen 11/18/2015, 9:59 PM

## 2015-11-18 NOTE — Progress Notes (Signed)
   Rhegan Trunnell is a 25 y.o. G1P0000 at [redacted]w[redacted]d  admitted for active labor  Subjective:  Comfortable with epidural Objective: Filed Vitals:   11/18/15 1910 11/18/15 1911 11/18/15 1916 11/18/15 1927  BP:  108/79 150/78 125/84  Pulse:  121 124 98  Temp:    98.4 F (36.9 C)  TempSrc:    Oral  Resp: FHT:  FHR: 140 bpm, variability: moderate,  accelerations:  Present,  decelerations:  Absent UC:   irregular, every 2-4 minutes SVE:   Dilation: 4.5 Effacement (%): 100 Station: -2 Exam by:: Illene Bolus, CNM  Labs: Lab Results  Component Value Date   WBC 9.4 11/18/2015   HGB 12.9 11/18/2015   HCT 37.8 11/18/2015   MCV 79.9 11/18/2015   PLT 253 11/18/2015    Assessment / Plan: Spontaneous labor, progressing normally  Labor: Progressing normally Fetal Wellbeing:  Category I Pain Control:  Epidural Anticipated MOD:  NSVD  CRESENZO-DISHMAN,Kymani Shimabukuro 11/18/2015, 7:42 PM

## 2015-11-18 NOTE — H&P (Signed)
LABOR ADMISSION HISTORY AND PHYSICAL  Maureen Cameron is a 25 y.o. female G1P0000 with IUP at 54w4dby LMP c/w 188w2d[place] for SOL. She reports +FM, + contractions, No LOF, no VB (except for some light pink discharge), no blurry vision, headaches or peripheral edema, and RUQ pain.  She plans on breast feeding. She request Nexplanon (not definitely sure) for birth control.  Dating: By LMP c/w 19wUS --->  Estimated Date of Delivery: 12/05/15  Sono:    @[redacted]w[redacted]d , CWD, thickened nuchal fold noted (7.2m35m left thumb appears to be hyperextended but may be positional (normal variant),291g, 50% EFW @[redacted]w[redacted]d , Normal anatomy, 651g 67% EFW  Prenatal History/Complications: A2GW2HENlyburide) Rubella Nonimmune Varicella Nonimmune  Nuchal Fold thickening by US:[place] with genetic counselor, declined further testing Cosanguinity (second cousin, met with genetics, declined counseling)   Past Medical History: Past Medical History  Diagnosis Date  . Medical history non-contributory   . Gestational diabetes     Past Surgical History: Past Surgical History  Procedure Laterality Date  . No past surgeries      Obstetrical History: OB History    Gravida Para Term Preterm AB TAB SAB Ectopic Multiple Living   1 0 0 0 0 0 0 0 0 0       Social History: Social History   Social History  . Marital Status: Married    Spouse Name: N/A  . Number of Children: N/A  . Years of Education: N/A   Social History Main Topics  . Smoking status: Never Smoker   . Smokeless tobacco: Never Used  . Alcohol Use: No  . Drug Use: No  . Sexual Activity: Not Currently   Other Topics Concern  . None   Social History Narrative    Family History: Family History  Problem Relation Age of Onset  . Hypertension Mother   . Diabetes Mother   . Hypertension Father     Allergies: No Known Allergies  Prescriptions prior to admission  Medication Sig Dispense Refill Last Dose  . glyBURIDE (DIABETA) 2.5 MG tablet  Take one tablet daily with breakfast (Patient taking differently: Take 2.5 mg by mouth daily with breakfast. Take one tablet daily with breakfast) 30 tablet 3 11/17/2015 at Unknown time  . Prenatal Vit-Fe Fumarate-FA (MULTIVITAMIN-PRENATAL) 27-0.8 MG TABS tablet Take 1 tablet by mouth daily at 12 noon.   11/17/2015 at Unknown time  . ACCU-CHEK FASTCLIX LANCETS MISC Inject 1 each into the skin 4 (four) times daily. New onset GDM O24.419 for testing 4 times daily 102 each 12 Taking  . glucose blood (ACCU-CHEK SMARTVIEW) test strip Use as instructed 100 each 12 Taking     Review of Systems   All systems reviewed and negative except as stated in HPI  BP 124/72 mmHg  Pulse 87  Temp(Src) 97.7 F (36.5 C)  Resp 18  LMP 02/28/2015 (LMP Unknown) General appearance: alert and cooperative Lungs: clear to auscultation bilaterally Heart: regular rate and rhythm Abdomen: soft, non-tender; bowel sounds normal Extremities: Homans sign is negative, no sign of DVT, edema Fetal monitoringBaseline: 135 bpm, Variability: Good {> 6 bpm), Accelerations: Reactive and Decelerations: Absent Uterine activity: every 2-4 mins Dilation: 5 Effacement (%): 100 Station: -2 Exam by:: L. Raquel Sayres CNM   Prenatal labs: ABO, Rh: AB/Positive/-- (08/18 0000) Antibody: Negative (08/18 0000) Rubella: Rubella nonimmune RPR: Nonreactive (08/18 0000)  HBsAg: Negative (08/18 0000)  HIV: Non-reactive (08/18 0000)  GBS: Negative (01/16 0000)  1 hr Glucola early 116; 3rd trimester 152; failed  3hr.  Genetic screening: Quad neg, nuchal cord thickening by [place]: met genetics, declined further testing  Anatomy US: nml  Prenatal Transfer Tool  Maternal Diabetes: yes; A2GDM (Glyburide) Genetic Screening: QUAD neg, thickened nuchal cord by [place]: met genetics, declined further testing Maternal Ultrasounds/Referrals: Normal Fetal Ultrasounds or other Referrals:  None Maternal Substance Abuse:  No Significant Maternal Medications:   None Significant Maternal Lab Results: Lab values include: Group B Strep negative  Results for orders placed or performed during the hospital encounter of 11/18/15 (from the past 24 hour(s))  Urinalysis, Routine w reflex microscopic (not at [facility])   Collection Time: 11/18/15  8:10 AM  Result Value Ref Range   Color, Urine YELLOW YELLOW   APPearance HAZY (A) CLEAR   Specific Gravity, Urine 1.025 1.005 - 1.030   pH 6.5 5.0 - 8.0   Glucose, UA NEGATIVE NEGATIVE mg/dL   Hgb urine dipstick LARGE (A) NEGATIVE   Bilirubin Urine NEGATIVE NEGATIVE   Ketones, ur NEGATIVE NEGATIVE mg/dL   Protein, ur 100 (A) NEGATIVE mg/dL   Nitrite NEGATIVE NEGATIVE   Leukocytes, UA MODERATE (A) NEGATIVE  Urine microscopic-add on   Collection Time: 11/18/15  8:10 AM  Result Value Ref Range   Squamous Epithelial / LPF 0-5 (A) NONE SEEN   WBC, UA 6-30 0 - 5 WBC/hpf   RBC / HPF 0-5 0 - 5 RBC/hpf   Bacteria, UA FEW (A) NONE SEEN    Patient Active Problem List   Diagnosis Date Noted  . Gestational diabetes mellitus (GDM) in third trimester 11/18/2015  . Supervision of high-risk pregnancy 10/04/2015  . A2GDM 10/04/2015  . Rubella non-immune status, antepartum 10/04/2015  . Maternal varicella, non-immune 10/04/2015  . 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 Consanguinity 07/12/2015    Assessment: Maureen Cameron is a 25 y.o. G1P0000 at 44w4dhere for SOL.  #Labor: expected management #Pain: Desires epidural #FWB: Cat 1 #ID: GBS neg #MOF: breast #MOC: Nexplanon (not definite) #Circ:  n/a  KSmiley Houseman MD PGY 1 Family Medicine      CNM attestation:  I have seen and examined this patient; I agree with above documentation in the Resident's note.    Makendra Vigeant Grissett, CNM 2:27 PM

## 2015-11-18 NOTE — Anesthesia Preprocedure Evaluation (Addendum)
Anesthesia Evaluation  Patient identified by MRN, date of birth, ID band Patient awake    Reviewed: Allergy & Precautions, H&P , Patient's Chart, lab work & pertinent test results  Airway Mallampati: II  TM Distance: >3 FB Neck ROM: full    Dental  (+) Teeth Intact   Pulmonary    breath sounds clear to auscultation       Cardiovascular  Rhythm:regular Rate:Normal     Neuro/Psych    GI/Hepatic   Endo/Other  diabetes (gestational), Oral Hypoglycemic Agents  Renal/GU      Musculoskeletal   Abdominal   Peds  Hematology   Anesthesia Other Findings       Reproductive/Obstetrics (+) Pregnancy                           Anesthesia Physical Anesthesia Plan  ASA: II  Anesthesia Plan: Epidural   Post-op Pain Management:    Induction:   Airway Management Planned:   Additional Equipment:   Intra-op Plan:   Post-operative Plan:   Informed Consent: I have reviewed the patients History and Physical, chart, labs and discussed the procedure including the risks, benefits and alternatives for the proposed anesthesia with the patient or authorized representative who has indicated his/her understanding and acceptance.   Dental Advisory Given  Plan Discussed with:   Anesthesia Plan Comments: (Labs checked- platelets confirmed with RN in room. Fetal heart tracing, per RN, reported to be stable enough for sitting procedure. Discussed epidural, and patient consents to the procedure:  included risk of possible headache,backache, failed block, allergic reaction, and nerve injury. This patient was asked if she had any questions or concerns before the procedure started.)        Anesthesia Quick Evaluation

## 2015-11-18 NOTE — Anesthesia Procedure Notes (Signed)
Epidural Patient location during procedure: OB  Preanesthetic Checklist Completed: patient identified, site marked, surgical consent, pre-op evaluation, timeout performed, IV checked, risks and benefits discussed and monitors and equipment checked  Epidural Patient position: sitting Prep: site prepped and draped and DuraPrep Patient monitoring: continuous pulse ox and blood pressure Approach: midline Location: L3-L4 Injection technique: LOR air  Needle:  Needle type: Tuohy  Needle gauge: 17 G Needle length: 9 cm and 9 Needle insertion depth: 5 cm (3 cm; may get PDPHA due to Skin local. No wet tap.) cm Catheter type: closed end flexible Catheter size: 19 Gauge Catheter at skin depth: 9 cm Test dose: negative  Assessment Events: blood not aspirated, injection not painful, no injection resistance, negative IV test and no paresthesia  Additional Notes Dosing of Epidural:  1st dose, through catheter ............................................Marland Kitchen  Xylocaine 40 mg  2nd dose, through catheter, after waiting 3 minutes........Marland KitchenXylocaine 60 mg    As each dose occurred, patient was free of IV sx; and patient exhibited no evidence of SA injection.  Patient is more comfortable after epidural dosed. Please see RN's note for documentation of vital signs,and FHR which are stable.  Patient reminded not to try to ambulate with numb legs, and that an RN must be present when she attempts to get up.

## 2015-11-18 NOTE — MAU Note (Signed)
Pt presents to MAU with complaints of contractions with bloody show. PT is scheduled for an induction on February the 12th for gestational diabetes

## 2015-11-19 ENCOUNTER — Encounter (HOSPITAL_COMMUNITY): Payer: Self-pay | Admitting: *Deleted

## 2015-11-19 ENCOUNTER — Encounter (HOSPITAL_COMMUNITY)
Admission: AD | Disposition: A | Discharge status: Home / Self Care | Payer: Self-pay | Source: Ambulatory Visit | Attending: Family Medicine

## 2015-11-19 ENCOUNTER — Other Ambulatory Visit: Payer: Medicaid Other

## 2015-11-19 DIAGNOSIS — O24419 Gestational diabetes mellitus in pregnancy, unspecified control: Secondary | ICD-10-CM

## 2015-11-19 DIAGNOSIS — Z3A37 37 weeks gestation of pregnancy: Secondary | ICD-10-CM

## 2015-11-19 LAB — COMPREHENSIVE METABOLIC PANEL
ALBUMIN: 2.3 g/dL — AB (ref 3.5–5.0)
ALT: 31 U/L (ref 14–54)
ANION GAP: 9 (ref 5–15)
AST: 36 U/L (ref 15–41)
Alkaline Phosphatase: 128 U/L — ABNORMAL HIGH (ref 38–126)
BUN: 8 mg/dL (ref 6–20)
CHLORIDE: 107 mmol/L (ref 101–111)
CO2: 20 mmol/L — AB (ref 22–32)
Calcium: 8.1 mg/dL — ABNORMAL LOW (ref 8.9–10.3)
Creatinine, Ser: 0.64 mg/dL (ref 0.44–1.00)
GFR calc Af Amer: >60 mL/min (ref >60)
GFR calc non Af Amer: >60 mL/min (ref >60)
GLUCOSE: 86 mg/dL (ref 65–99)
POTASSIUM: 3.6 mmol/L (ref 3.5–5.1)
SODIUM: 136 mmol/L (ref 135–145)
Total Bilirubin: 0.9 mg/dL (ref 0.3–1.2)
Total Protein: 5.2 g/dL — ABNORMAL LOW (ref 6.5–8.1)

## 2015-11-19 LAB — PROTEIN / CREATININE RATIO, URINE
Creatinine, Urine: 15 mg/dL
PROTEIN CREATININE RATIO: 3.73 mg/mg{creat} — AB (ref 0.00–0.15)
Total Protein, Urine: 56 mg/dL

## 2015-11-19 LAB — RPR: RPR: NONREACTIVE

## 2015-11-19 LAB — GLUCOSE, CAPILLARY
GLUCOSE-CAPILLARY: 68 mg/dL (ref 65–99)
GLUCOSE-CAPILLARY: 73 mg/dL (ref 65–99)
GLUCOSE-CAPILLARY: 76 mg/dL (ref 65–99)
GLUCOSE-CAPILLARY: 80 mg/dL (ref 65–99)
Glucose-Capillary: 87 mg/dL (ref 65–99)

## 2015-11-19 LAB — CBC
HEMATOCRIT: 33.4 % — AB (ref 36.0–46.0)
Hemoglobin: 11.1 g/dL — ABNORMAL LOW (ref 12.0–15.0)
MCH: 26.3 pg (ref 26.0–34.0)
MCHC: 33.2 g/dL (ref 30.0–36.0)
MCV: 79.1 fL (ref 78.0–100.0)
Platelets: 191 10*3/uL (ref 150–400)
RBC: 4.22 MIL/uL (ref 3.87–5.11)
RDW: 16 % — AB (ref 11.5–15.5)
WBC: 16.8 10*3/uL — AB (ref 4.0–10.5)

## 2015-11-19 SURGERY — Surgical Case
Anesthesia: Epidural

## 2015-11-19 MED ORDER — MEASLES, MUMPS & RUBELLA VAC ~~LOC~~ INJ
0.5000 mL | INJECTION | Freq: Once | SUBCUTANEOUS | Status: AC
  Administered 2015-11-22: 0.5 mL via SUBCUTANEOUS
  Filled 2015-11-19: qty 0.5

## 2015-11-19 MED ORDER — MORPHINE SULFATE (PF) 0.5 MG/ML IJ SOLN: INTRAMUSCULAR | Status: DC | PRN

## 2015-11-19 MED ORDER — LACTATED RINGERS IV SOLN
INTRAVENOUS | Status: DC | PRN
  Administered 2015-11-19: 13:00:00 via INTRAVENOUS

## 2015-11-19 MED ORDER — DIPHENHYDRAMINE HCL 50 MG/ML IJ SOLN: 12.5000 mg | INTRAMUSCULAR | Status: DC | PRN

## 2015-11-19 MED ORDER — WITCH HAZEL-GLYCERIN EX PADS: 1.0000 "application " | MEDICATED_PAD | CUTANEOUS | Status: DC | PRN

## 2015-11-19 MED ORDER — MIDAZOLAM HCL 2 MG/2ML IJ SOLN
INTRAMUSCULAR | Status: AC
  Filled 2015-11-19: qty 2

## 2015-11-19 MED ORDER — MORPHINE SULFATE (PF) 0.5 MG/ML IJ SOLN
INTRAMUSCULAR | Status: AC
  Filled 2015-11-19: qty 10

## 2015-11-19 MED ORDER — ACETAMINOPHEN 325 MG PO TABS: 650.0000 mg | ORAL_TABLET | ORAL | Status: DC | PRN

## 2015-11-19 MED ORDER — CLINDAMYCIN PHOSPHATE 900 MG/50ML IV SOLN: 900.0000 mg | INTRAVENOUS | Status: DC

## 2015-11-19 MED ORDER — SODIUM CHLORIDE 0.9 % IR SOLN
Status: DC | PRN
Start: 2015-11-19 — End: 2015-11-19
  Administered 2015-11-19 (×2): 1000 mL

## 2015-11-19 MED ORDER — SODIUM BICARBONATE 8.4 % IV SOLN
INTRAVENOUS | Status: DC | PRN
  Administered 2015-11-19: 3 mL via EPIDURAL
  Administered 2015-11-19: 10 mL via EPIDURAL

## 2015-11-19 MED ORDER — VARICELLA VIRUS VACCINE LIVE 1350 PFU/0.5ML IJ SUSR: 0.5000 mL | Freq: Once | INTRAMUSCULAR | Status: DC

## 2015-11-19 MED ORDER — OXYCODONE-ACETAMINOPHEN 5-325 MG PO TABS
1.0000 | ORAL_TABLET | ORAL | Status: DC | PRN
  Administered 2015-11-21 – 2015-11-22 (×2): 1 via ORAL
  Filled 2015-11-19 (×2): qty 1

## 2015-11-19 MED ORDER — BUPIVACAINE HCL (PF) 0.25 % IJ SOLN
INTRAMUSCULAR | Status: AC
  Filled 2015-11-19: qty 30

## 2015-11-19 MED ORDER — SODIUM BICARBONATE 8.4 % IV SOLN
INTRAVENOUS | Status: AC
  Filled 2015-11-19: qty 50

## 2015-11-19 MED ORDER — FENTANYL CITRATE (PF) 100 MCG/2ML IJ SOLN: 25.0000 ug | INTRAMUSCULAR | Status: DC | PRN

## 2015-11-19 MED ORDER — OXYTOCIN 10 UNIT/ML IJ SOLN
INTRAMUSCULAR | Status: AC
  Filled 2015-11-19: qty 4

## 2015-11-19 MED ORDER — IBUPROFEN 600 MG PO TABS
600.0000 mg | ORAL_TABLET | Freq: Four times a day (QID) | ORAL | Status: DC
  Administered 2015-11-19 – 2015-11-22 (×11): 600 mg via ORAL
  Filled 2015-11-19 (×11): qty 1

## 2015-11-19 MED ORDER — ZOLPIDEM TARTRATE 5 MG PO TABS: 5.0000 mg | ORAL_TABLET | Freq: Every evening | ORAL | Status: DC | PRN

## 2015-11-19 MED ORDER — SCOPOLAMINE 1 MG/3DAYS TD PT72
MEDICATED_PATCH | TRANSDERMAL | Status: AC
  Filled 2015-11-19: qty 1

## 2015-11-19 MED ORDER — NALBUPHINE HCL 10 MG/ML IJ SOLN: 5.0000 mg | Freq: Once | INTRAMUSCULAR | Status: DC | PRN

## 2015-11-19 MED ORDER — SIMETHICONE 80 MG PO CHEW
80.0000 mg | CHEWABLE_TABLET | Freq: Three times a day (TID) | ORAL | Status: DC
Start: 2015-11-19 — End: 2015-11-22
  Administered 2015-11-19 – 2015-11-22 (×5): 80 mg via ORAL
  Filled 2015-11-19 (×7): qty 1

## 2015-11-19 MED ORDER — SIMETHICONE 80 MG PO CHEW: 80.0000 mg | CHEWABLE_TABLET | ORAL | Status: DC | PRN

## 2015-11-19 MED ORDER — ONDANSETRON HCL 4 MG/2ML IJ SOLN
INTRAMUSCULAR | Status: AC
  Filled 2015-11-19: qty 2

## 2015-11-19 MED ORDER — CEFAZOLIN SODIUM-DEXTROSE 2-3 GM-% IV SOLR
INTRAVENOUS | Status: AC
  Filled 2015-11-19: qty 50

## 2015-11-19 MED ORDER — LIDOCAINE-EPINEPHRINE (PF) 2 %-1:200000 IJ SOLN
INTRAMUSCULAR | Status: AC
  Filled 2015-11-19: qty 20

## 2015-11-19 MED ORDER — SODIUM CHLORIDE 0.9 % IV SOLN
2.0000 g | Freq: Once | INTRAVENOUS | Status: AC
  Administered 2015-11-19: 2 g via INTRAVENOUS
  Filled 2015-11-19: qty 2000

## 2015-11-19 MED ORDER — NALOXONE HCL 0.4 MG/ML IJ SOLN: 0.4000 mg | INTRAMUSCULAR | Status: DC | PRN

## 2015-11-19 MED ORDER — MEPERIDINE HCL 25 MG/ML IJ SOLN
INTRAMUSCULAR | Status: AC
  Filled 2015-11-19: qty 1

## 2015-11-19 MED ORDER — GENTAMICIN SULFATE 40 MG/ML IJ SOLN: 5.0000 mg/kg | INTRAVENOUS | Status: DC

## 2015-11-19 MED ORDER — NALOXONE HCL 2 MG/2ML IJ SOSY: 1.0000 ug/kg/h | PREFILLED_SYRINGE | INTRAVENOUS | Status: DC | PRN

## 2015-11-19 MED ORDER — DIBUCAINE 1 % RE OINT: 1.0000 "application " | TOPICAL_OINTMENT | RECTAL | Status: DC | PRN

## 2015-11-19 MED ORDER — LACTATED RINGERS IV SOLN: 2.5000 [IU]/h | INTRAVENOUS | Status: AC

## 2015-11-19 MED ORDER — SIMETHICONE 80 MG PO CHEW
80.0000 mg | CHEWABLE_TABLET | ORAL | Status: DC
  Administered 2015-11-19 – 2015-11-22 (×3): 80 mg via ORAL
  Filled 2015-11-19 (×3): qty 1

## 2015-11-19 MED ORDER — SODIUM CHLORIDE 0.9% FLUSH: 3.0000 mL | INTRAVENOUS | Status: DC | PRN

## 2015-11-19 MED ORDER — OXYTOCIN 10 UNIT/ML IJ SOLN
40.0000 [IU] | INTRAMUSCULAR | Status: DC | PRN
  Administered 2015-11-19: 40 [IU] via INTRAVENOUS

## 2015-11-19 MED ORDER — NALBUPHINE HCL 10 MG/ML IJ SOLN: 5.0000 mg | INTRAMUSCULAR | Status: DC | PRN

## 2015-11-19 MED ORDER — LACTATED RINGERS IV SOLN
INTRAVENOUS | Status: DC
  Administered 2015-11-19: 15:00:00 via INTRAVENOUS

## 2015-11-19 MED ORDER — PRENATAL MULTIVITAMIN CH
1.0000 | ORAL_TABLET | Freq: Every day | ORAL | Status: DC
  Administered 2015-11-20 – 2015-11-22 (×3): 1 via ORAL
  Filled 2015-11-19 (×3): qty 1

## 2015-11-19 MED ORDER — MIDAZOLAM HCL 2 MG/2ML IJ SOLN
INTRAMUSCULAR | Status: DC | PRN
  Administered 2015-11-19: 1 mg via INTRAVENOUS

## 2015-11-19 MED ORDER — SCOPOLAMINE 1 MG/3DAYS TD PT72: 1.0000 | MEDICATED_PATCH | Freq: Once | TRANSDERMAL | Status: DC

## 2015-11-19 MED ORDER — KETOROLAC TROMETHAMINE 30 MG/ML IJ SOLN
INTRAMUSCULAR | Status: AC
  Administered 2015-11-19: 30 mg
  Filled 2015-11-19: qty 1

## 2015-11-19 MED ORDER — ONDANSETRON HCL 4 MG/2ML IJ SOLN
INTRAMUSCULAR | Status: DC | PRN
  Administered 2015-11-19: 4 mg via INTRAVENOUS

## 2015-11-19 MED ORDER — SENNOSIDES-DOCUSATE SODIUM 8.6-50 MG PO TABS
2.0000 | ORAL_TABLET | ORAL | Status: DC
  Administered 2015-11-19 – 2015-11-22 (×3): 2 via ORAL
  Filled 2015-11-19 (×3): qty 2

## 2015-11-19 MED ORDER — DIPHENHYDRAMINE HCL 25 MG PO CAPS: 25.0000 mg | ORAL_CAPSULE | Freq: Four times a day (QID) | ORAL | Status: DC | PRN

## 2015-11-19 MED ORDER — MENTHOL 3 MG MT LOZG: 1.0000 | LOZENGE | OROMUCOSAL | Status: DC | PRN

## 2015-11-19 MED ORDER — OXYCODONE-ACETAMINOPHEN 5-325 MG PO TABS: 2.0000 | ORAL_TABLET | ORAL | Status: DC | PRN

## 2015-11-19 MED ORDER — ONDANSETRON HCL 4 MG/2ML IJ SOLN
4.0000 mg | Freq: Three times a day (TID) | INTRAMUSCULAR | Status: DC | PRN
Start: 2015-11-19 — End: 2015-11-22

## 2015-11-19 MED ORDER — DIPHENHYDRAMINE HCL 25 MG PO CAPS: 25.0000 mg | ORAL_CAPSULE | ORAL | Status: DC | PRN

## 2015-11-19 MED ORDER — MEPERIDINE HCL 25 MG/ML IJ SOLN
INTRAMUSCULAR | Status: DC | PRN
  Administered 2015-11-19: 25 mg via INTRAVENOUS

## 2015-11-19 MED ORDER — DEXTROSE 5 % IV SOLN
Freq: Once | INTRAVENOUS | Status: AC
  Administered 2015-11-19: 12:00:00 via INTRAVENOUS
  Filled 2015-11-19: qty 7

## 2015-11-19 MED ORDER — TETANUS-DIPHTH-ACELL PERTUSSIS 5-2.5-18.5 LF-MCG/0.5 IM SUSP: 0.5000 mL | Freq: Once | INTRAMUSCULAR | Status: DC

## 2015-11-19 MED ORDER — MORPHINE SULFATE (PF) 0.5 MG/ML IJ SOLN
INTRAMUSCULAR | Status: DC | PRN
  Administered 2015-11-19: 3 mg via EPIDURAL

## 2015-11-19 MED ORDER — LACTATED RINGERS IV SOLN
INTRAVENOUS | Status: DC | PRN
  Administered 2015-11-19: 12:00:00 via INTRAVENOUS

## 2015-11-19 MED ORDER — BUPIVACAINE HCL (PF) 0.25 % IJ SOLN
INTRAMUSCULAR | Status: DC | PRN
  Administered 2015-11-19: 30 mL

## 2015-11-19 MED ORDER — LACTATED RINGERS IV SOLN: INTRAVENOUS | Status: DC

## 2015-11-19 MED ORDER — KETOROLAC TROMETHAMINE 30 MG/ML IJ SOLN: 30.0000 mg | Freq: Four times a day (QID) | INTRAMUSCULAR | Status: AC | PRN

## 2015-11-19 MED ORDER — LANOLIN HYDROUS EX OINT: 1.0000 "application " | TOPICAL_OINTMENT | CUTANEOUS | Status: DC | PRN

## 2015-11-19 MED ORDER — DEXTROSE 5 % IV SOLN
2.0000 g | Freq: Four times a day (QID) | INTRAVENOUS | Status: AC
  Administered 2015-11-19 – 2015-11-20 (×4): 2 g via INTRAVENOUS
  Filled 2015-11-19 (×4): qty 2

## 2015-11-19 MED ORDER — MORPHINE SULFATE (PF) 10 MG/ML IV SOLN: INTRAVENOUS | Status: DC | PRN

## 2015-11-19 SURGICAL SUPPLY — 30 items
BENZOIN TINCTURE PRP APPL 2/3 (GAUZE/BANDAGES/DRESSINGS) ×3 IMPLANT
CLAMP CORD UMBIL (MISCELLANEOUS) IMPLANT
CLOSURE STERI STRIP 1/2 X4 (GAUZE/BANDAGES/DRESSINGS) ×3 IMPLANT
CLOTH BEACON ORANGE TIMEOUT ST (SAFETY) ×3 IMPLANT
DRAPE SHEET LG 3/4 BI-LAMINATE (DRAPES) IMPLANT
DRSG OPSITE POSTOP 4X10 (GAUZE/BANDAGES/DRESSINGS) ×3 IMPLANT
DURAPREP 26ML APPLICATOR (WOUND CARE) ×3 IMPLANT
ELECT REM PT RETURN 9FT ADLT (ELECTROSURGICAL) ×3
ELECTRODE REM PT RTRN 9FT ADLT (ELECTROSURGICAL) ×1 IMPLANT
EXTRACTOR VACUUM M CUP 4 TUBE (SUCTIONS) IMPLANT
EXTRACTOR VACUUM M CUP 4' TUBE (SUCTIONS)
GLOVE BIOGEL PI IND STRL 7.0 (GLOVE) ×2 IMPLANT
GLOVE BIOGEL PI INDICATOR 7.0 (GLOVE) ×4
GLOVE ECLIPSE 7.0 STRL STRAW (GLOVE) ×6 IMPLANT
GOWN STRL REUS W/TWL LRG LVL3 (GOWN DISPOSABLE) ×6 IMPLANT
KIT ABG SYR 3ML LUER SLIP (SYRINGE) IMPLANT
NEEDLE HYPO 22GX1.5 SAFETY (NEEDLE) ×3 IMPLANT
NEEDLE HYPO 25X5/8 SAFETYGLIDE (NEEDLE) IMPLANT
NS IRRIG 1000ML POUR BTL (IV SOLUTION) ×3 IMPLANT
PACK C SECTION WH (CUSTOM PROCEDURE TRAY) ×3 IMPLANT
PAD ABD DERMACEA PRESS 5X9 (GAUZE/BANDAGES/DRESSINGS) ×3 IMPLANT
PAD OB MATERNITY 4.3X12.25 (PERSONAL CARE ITEMS) ×3 IMPLANT
PENCIL SMOKE EVAC W/HOLSTER (ELECTROSURGICAL) ×3 IMPLANT
RTRCTR C-SECT PINK 25CM LRG (MISCELLANEOUS) ×3 IMPLANT
SUT VIC AB 0 CTX 36 (SUTURE) ×6
SUT VIC AB 0 CTX36XBRD ANBCTRL (SUTURE) ×3 IMPLANT
SUT VIC AB 4-0 KS 27 (SUTURE) ×3 IMPLANT
SYR 30ML LL (SYRINGE) ×3 IMPLANT
TOWEL OR 17X24 6PK STRL BLUE (TOWEL DISPOSABLE) ×3 IMPLANT
TRAY FOLEY CATH SILVER 14FR (SET/KITS/TRAYS/PACK) ×3 IMPLANT

## 2015-11-19 NOTE — Transfer of Care (Signed)
Immediate Anesthesia Transfer of Care Note  Patient: Maureen Cameron  Procedure(s) Performed: Procedure(s): CESAREAN SECTION (N/A)  Patient Location: PACU  Anesthesia Type:Epidural  Level of Consciousness: awake, alert  and oriented  Airway & Oxygen Therapy: Patient Spontanous Breathing  Post-op Assessment: Report given to RN and Post -op Vital signs reviewed and stable  Post vital signs: Reviewed and stable  Last Vitals:  Filed Vitals:   11/19/15 1142 11/19/15 1155  BP: 132/88 144/76  Pulse: 126 124  Temp:    Resp:      Complications: No apparent anesthesia complications

## 2015-11-19 NOTE — Lactation Note (Signed)
This note was copied from the chart of Maureen Cameron. Lactation Consultation Note  Patient Name: Maureen Cameron Date: 11/19/2015 Reason for consult: Initial assessment Baby at 6 hr of life. Mom was working with the RN. Left handouts and instructions to call at next feeding.   Maternal Data    Feeding Feeding Type: Breast Fed Length of feed: 0 min  LATCH Score/Interventions                      Lactation Tools Discussed/Used     Consult Status Consult Status: Follow-up Date: 11/19/15 Follow-up type: In-patient    Rulon Eisenmenger 11/19/2015, 6:54 PM

## 2015-11-19 NOTE — Op Note (Signed)
Cesarean Section Operative Report  Maureen Cameron  11/18/2015 - 11/19/2015  Indications: arrest of 2nd stage, triple I  Pre-operative Diagnosis: Cesarean Section for arrest of descent.   Post-operative Diagnosis: Same   Surgeon: Surgeon(s) and Role:    * Reva Bores, MD - Primary    * Kathrynn Running, MD - Fellow   Attending Attestation: I was present and scrubbed for the entire procedure.   Anesthesia: epidural    Estimated Blood Loss: 600 ml  Total IV Fluids: 1300 ml LR  Urine Output:: 100 ml pink-tinged urine, yellow urine in foley tube  Specimens: placenta to pathology  Findings: Viable female infant in cephalic presentation; Apgars 1/61; weight 3490 g; arterial cord pH not obtained; clear amniotic fluid; intact placenta with three vessel cord; normal uterus, fallopian tubes and ovaries bilaterally. Bandl's ring incidentally noted.  Baby condition / location:  Couplet care / Skin to Skin   Complications: no complications  Indications: Maureen Cameron is a 25 y.o. G1P1001 with an IUP [redacted]w[redacted]d who presented in spontaneous labor. C/s for arrest of 2nd stage. See progress note for details.  The risks, benefits, complications, treatment options, and expected outcomes were discussed with the patient . The patient concurred with the proposed plan, giving informed consent. identified as [employer] and the procedure verified as C-Section Delivery.  Procedure Details:  The patient was taken back to the operative suite where epidural anesthesia was dosed.  A time out was held and the above information confirmed.   Prior to prep vertex was manually elevated into the pelvis.  After induction of anesthesia, the patient was draped and prepped in the usual sterile manner and placed in a dorsal supine position with a leftward tilt. A Pfannenstiel incision was made and carried down through the subcutaneous tissue to the fascia. Fascial incision was made and sharply extended transversely.  The fascia was separated from the underlying rectus tissue superiorly and inferiorly. The peritoneum was identified and bluntly entered and extended longitudinally. Alexis retractor was placed. Bandl's ring noted. A low transverse uterine incision was made and extended bluntly. Delivered from cephalic presentation was a viable infant with Apgars and weight as above. The umbilical cord was clamped and cut cord blood was obtained for evaluation. Cord ph was not sent. The placenta was removed Intact and appeared normal. The uterine outline, tubes and ovaries appeared normal}. The uterine incision was closed with running locked sutures of 0Vicryl with an imbricating layer of the same.   Hemostasis was observed. The peritoneum was closed with 0 vicryl. The rectus muscles were examined and hemostasis observed. The fascia was then reapproximated with running sutures of 0Vicryl. No subcuticular closure. 30 ml marcaine 0.25% injected subcutaneously at the margins of the incision site. The skin was closed with 4-0Vicryl.   Instrument, sponge, and needle counts were correct prior the abdominal closure and were correct at the conclusion of the case.     Disposition: PACU - hemodynamically stable.   Maternal Condition: stable       Signed: Cherrie Gauze WoukMD 11/19/2015 1:12 PM

## 2015-11-19 NOTE — Progress Notes (Signed)
   Maureen Cameron is a 25 y.o. G1P0000 at [redacted]w[redacted]d  admitted for active labor  Subjective: Comfortable with epidural  Objective: Filed Vitals:   11/18/15 2301 11/18/15 2331 11/19/15 0001 11/19/15 0031  BP: 124/82 123/75 133/80 136/83  Pulse: 95 92 98 117  Temp:    99.8 F (37.7 C)  TempSrc:    Oral  Resp: Height:      Weight:       Total I/O In: -  Out: 350 [Urine:350]  FHT:  FHR: 140 bpm, variability: moderate,  accelerations:  Present,  decelerations:  Absent UC:   regular, every 2 minutes SVE:   Dilation: 9 Effacement (%): 100 Station: -1 Exam by:: [place]: Lab Results  Component Value Date   WBC 9.4 11/18/2015   HGB 12.9 11/18/2015   HCT 37.8 11/18/2015   MCV 79.9 11/18/2015   PLT 253 11/18/2015    Assessment / Plan: Spontaneous labor, progressing normally  Labor: Progressing normally Fetal Wellbeing:  Category I Pain Control:  Epidural Anticipated MOD:  NSVD  CRESENZO-DISHMAN,Martyn Timme 11/19/2015, 12:59 AM

## 2015-11-19 NOTE — Progress Notes (Signed)
LABOR PROGRESS NOTE  Maureen Cameron is a 25 y.o. G1P0000 at [redacted]w[redacted]d  admitted for SOL.   Subjective: Feeling tired  Objective: BP 144/76 mmHg  Pulse 124  Temp(Src) 101 F (38.3 C) (Axillary)  Resp 18  Ht  (1.575 m)  Wt 148 lb (67.132 kg)  BMI 27.06 kg/m2  LMP 02/28/2015 (LMP Unknown) or  Filed Vitals:   11/19/15 1101 11/19/15 1132 11/19/15 1142 11/19/15 1155  BP: 140/83 124/75 132/88 144/76  Pulse: 111 116 126 124  Temp: 101 F (38.3 C)     TempSrc:      Resp:      Height:      Weight:        145/mod/+a/-d  Dilation: 10 Dilation Complete Date: 11/19/15 Dilation Complete Time: 0328 Effacement (%): 100 Cervical Position: Middle Station: +1 Presentation: Vertex Exam by:: Herby Abraham, RN  Labs: Lab Results  Component Value Date   WBC 9.4 11/18/2015   HGB 12.9 11/18/2015   HCT 37.8 11/18/2015   MCV 79.9 11/18/2015   PLT 253 11/18/2015    Patient Active Problem List   Diagnosis Date Noted  . Gestational diabetes mellitus (GDM) in third trimester 11/18/2015  . Supervision of high-risk pregnancy 10/04/2015  . A2GDM 10/04/2015  . Rubella non-immune status, antepartum 10/04/2015  . Maternal varicella, non-immune 10/04/2015  . 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 Consanguinity 07/12/2015    Assessment / Plan: 25 y.o. G1P0000 at [redacted]w[redacted]d here for SOL.  Labor: complete for 9 hours, has actively pushed with good effort for 3.5+ hours. No movement past plus 1 station. Vertex is OT; patient did not tolerate attempt at manual version. Will proceed with c/s. The risks of cesarean section were discussed with the patient including but were not limited to: bleeding which may require transfusion or reoperation; infection which may require antibiotics; injury to bowel, bladder, ureters or other surrounding organs; injury to the fetus; need for additional procedures including hysterectomy in the event of a life-threatening hemorrhage; placental abnormalities  wth subsequent pregnancies, incisional problems, thromboembolic phenomenon and other postoperative/anesthesia complications.  Fetal Wellbeing:  Cat 1 Pain Control:  Epidural Fever: x2. No purulent lochia, no fetal tachycardia. Will give amp/gent/clinda prior to c/s A2GDM: glucose wnl  Silvano Bilis, MD 11/19/2015, 11:58 AM

## 2015-11-19 NOTE — Progress Notes (Signed)
Patient ID: Maureen Cameron, female   DOB: 1991-07-02, 25 y.o.   MRN: [id number] Patient is likely OT. She has been complete since 3 am and pushed for >2 hours with very little progress. Will move to C-section and give some Abx due to suspected Triple I.

## 2015-11-19 NOTE — Progress Notes (Signed)
Patient ID: Maureen Cameron, female   DOB: 1991/09/03, 25 y.o.   MRN: [id number]  Pt has been pushing x 2 hrs and has labored down x 3 hrs prior to that. EFM 150s, +accels, no decels Ctx q 2-4 mins Eval during push: vtx does not descend lower than pubic arch, +caput  Discussion w/ pt of having MD come to eval. Not likely vac will be offered and may be recommended for C/S. Pt and spouse agreeable to continue pushing until MD able to eval.  Tritia Endo, [facility] 11/19/2015 9:31 AM

## 2015-11-19 NOTE — Progress Notes (Addendum)
Labor Progress Note  Maureen Cameron is a 25 y.o. G1P0000 at [redacted]w[redacted]d  admitted for sol  S:  Patient doing well. No questions or concerns. Was pushing with nursing, currently taking a break  O:  BP 137/83 mmHg  Pulse 118  Temp(Src) 100.2 F (37.9 C) (Axillary)  Resp 18  Ht  (1.575 m)  Wt 67.132 kg (148 lb)  BMI 27.06 kg/m2  LMP 02/28/2015 (LMP Unknown)  Total I/O In: -  Out: 125 [Urine:125]  FHT:  FHR: 150 bpm, variability: min-mod (over the past 10 mins),  accelerations:  10x10,  Decelerations: variable UC:   Every SVE:   Dilation: 10 Effacement (%): 100 Station: +1 Exam by:: Maureen Abraham, RN AROM @ 2148  Labs: Lab Results  Component Value Date   WBC 9.4 11/18/2015   HGB 12.9 11/18/2015   HCT 37.8 11/18/2015   MCV 79.9 11/18/2015   PLT 253 11/18/2015    Assessment / Plan: 26 y.o. G1P0000 [redacted]w[redacted]d active labor   Spontaneous labor, progressing normally  Labor: Progressing normally; however has been  Pushing with nursing for about 2-3 hours without much progress. Will discuss with Dr. Shawnie Pons for evaluation to determine if patient will need vacuum assisted VD.  Fetal Wellbeing:  Category II; min-mod variability; good baseline, 10x10 accels, variable decel Pain Control:  Epidural Anticipated MOD:  NSVD  Maureen Holter, MD PGY 1 Family Medicine

## 2015-11-19 NOTE — Anesthesia Postprocedure Evaluation (Signed)
Anesthesia Post Note  Patient: Maureen Cameron  Procedure(s) Performed: Procedure(s) (LRB): CESAREAN SECTION (N/A)  Patient location during evaluation: PACU Anesthesia Type: Epidural Level of consciousness: awake and alert Pain management: pain level controlled Vital Signs Assessment: post-procedure vital signs reviewed and stable Respiratory status: spontaneous breathing, nonlabored ventilation, respiratory function stable and patient connected to nasal cannula oxygen Cardiovascular status: blood pressure returned to baseline and stable Postop Assessment: no signs of nausea or vomiting Anesthetic complications: no    Last Vitals:  Filed Vitals:   11/19/15 1500 11/19/15 1515  BP:  145/84  Pulse: 97 87  Temp:  37.7 C  Resp: 25 24    Last Pain:  Filed Vitals:   11/19/15 1550  PainSc: 0-No pain                 Kaedan Richert L

## 2015-11-20 LAB — CBC
HCT: 28.4 % — ABNORMAL LOW (ref 36.0–46.0)
HEMOGLOBIN: 9.4 g/dL — AB (ref 12.0–15.0)
MCH: 26.3 pg (ref 26.0–34.0)
MCHC: 33.1 g/dL (ref 30.0–36.0)
MCV: 79.6 fL (ref 78.0–100.0)
PLATELETS: 156 10*3/uL (ref 150–400)
RBC: 3.57 MIL/uL — ABNORMAL LOW (ref 3.87–5.11)
RDW: 15.9 % — AB (ref 11.5–15.5)
WBC: 10.6 10*3/uL — ABNORMAL HIGH (ref 4.0–10.5)

## 2015-11-20 NOTE — Lactation Note (Signed)
This note was copied from the chart of Maureen Ariele Galati. Lactation Consultation Note  Patient Name: Maureen Cameron [id number]'[id number] Date: 11/20/2015 Reason for consult: Follow-up assessment  Baby is 78 hours old. @ consult LC changed a large wet , and mec smear diaper.  LC reviewed steps for latching , breast massage , hand express, latch with breast compressions  Until swallows. LC explained to mom the importance of intermittent stimulation to breast or baby so  Baby will be enhanced to stay in a consistent pattern. Baby awake and latched 3 different times at consult  Swallows noted and per mom comfortable.  LC feels mom is committed breast feeding, but needs a lot of reinforcement of basics of latching and handling her  Baby . Mom seems very unsure of her self. Also reviewed how to hold her baby for burping.  [facility] RN Kristine Linea aware mom will need assistance.    Maternal Data    Feeding Feeding Type: Breast Fed Length of feed: 7 min (swallows noted )  LATCH Score/Interventions Latch: Grasps breast easily, tongue down, lips flanged, rhythmical sucking.  Audible Swallowing: A few with stimulation  Type of Nipple: Everted at rest and after stimulation  Comfort (Breast/Nipple): Soft / non-tender     Hold (Positioning): Assistance needed to correctly position infant at breast and maintain latch. Intervention(s): Breastfeeding basics reviewed;Support Pillows;Position options;Skin to skin  LATCH Score: 8  Lactation Tools Discussed/Used WIC Program: Yes (per mom [place] )   Consult Status Consult Status: Follow-up Date: 11/21/15 Follow-up type: In-patient    Kathrin Greathouse 11/20/2015, 11:28 AM

## 2015-11-20 NOTE — Progress Notes (Cosign Needed)
POSTPARTUM PROGRESS NOTE  Post Partum Day 1 Subjective:  Maureen Cameron is a 25 y.o. G1P1001 73w5dA2GDM (glyburide) s/p PLTCS 2/2 second stage arrest. Treated for triple I yesterday for 2 fevers prior to c/s with Tmax 101F @ 11AM on 11/19/15. Treated with amp, gent, and clinda x1 prior to c/s and cefoxitin x3 after c/s. No acute events overnight.  Pt denies problems with ambulating or voiding. Tolerating fluids well. Has not yet tried solid food. She denies nausea or vomiting.  Denies pain. Endorses uncomfortable pressure, particularly with standing or sitting.  She has had flatus. She has not had bowel movement.  Lochia Moderate. Endorses increased swelling in feet. Denies headache, blurred vision, abd pain, lightheadedness, fevers, calf tenderness, and SOB. Denies leaking from c/s incision site.  Interested in birth control options. Wants a few years before next pregnancy.   Objective: Blood pressure 99/70, pulse 79, temperature 97.6 F (36.4 C), temperature source Oral, resp. rate 18, height 5' 2"  (1.575 m), weight 67.132 kg (148 lb), last menstrual period 02/28/2015, SpO2 98 %, unknown if currently breastfeeding.  Physical Exam:  General: alert, cooperative and no distress Chest: CTAB Heart: RRR, normal S1S2, no m/r/g Abdomen: +BS, soft, nontender, incision site not examined  Uterine Fundus: Could not palpate Extremities: Non-pitting edema in feet, 2+ dorsalis pedis   Recent Labs  11/19/15 1509 11/20/15 0525  HGB 11.1* 9.4*  HCT 33.4* 28.4*   Urine protein:creatine = 3.73  Assessment/Plan:  ASSESSMENT: Maureen Cameron a 25y.o. G1P1001 373w5d2GDM (glyburide) s/p PLTCS 2/2 second stage arrest and antibiotic treatment for triple I.  - No further antibiotic therapy given no new fevers - Plan for discharge tomorrow - Breastfeeding - Contraception - Interested in Maureen Cameron options with minimal side effects - MMR vaccine as patient is rubella non-immune - A2GDM - well-controlled on  glyburide - Pre-eclampsia monitoring/testing - No hx during pregnancy. Elevated pressures to 140s/90s on 11/19/15 and elevated protein creatinine ratio. Edema may be 2/2 fluids and physiologic change of pregnancy but pre-eclampsia could be contributing. BP within normal limits today. Continue to monitor. Consider repeat protein creatinine ratio.    LOS: 2 days   Maureen Cameron/01/2016, 9:04 AM

## 2015-11-20 NOTE — Anesthesia Postprocedure Evaluation (Signed)
Anesthesia Post Note  Patient: Maureen Cameron  Procedure(s) Performed: Procedure(s) (LRB): CESAREAN SECTION (N/A)  Patient location during evaluation: Mother Baby Anesthesia Type: Epidural Level of consciousness: awake and alert Pain management: pain level controlled Vital Signs Assessment: post-procedure vital signs reviewed and stable Respiratory status: spontaneous breathing Cardiovascular status: stable Postop Assessment: no signs of nausea or vomiting and adequate PO intake Anesthetic complications: no    Last Vitals:  Filed Vitals:   11/20/15 0532 11/20/15 0906  BP: 99/70 109/55  Pulse: 79   Temp: 36.4 C 36.6 C  Resp: 18 19    Last Pain:  Filed Vitals:   11/20/15 0916  PainSc: 2                  Debbe Crumble Hristova

## 2015-11-20 NOTE — Addendum Note (Signed)
Addendum  created 11/20/15 1191 by Elgie Congo, CRNA   Modules edited: Clinical Notes   Clinical Notes:  File: [id number]

## 2015-11-21 ENCOUNTER — Encounter (HOSPITAL_COMMUNITY): Payer: Self-pay | Admitting: Family Medicine

## 2015-11-21 LAB — GLUCOSE, CAPILLARY: GLUCOSE-CAPILLARY: 100 mg/dL — AB (ref 65–99)

## 2015-11-21 MED ORDER — OXYCODONE-ACETAMINOPHEN 5-325 MG PO TABS: 1.0000 | ORAL_TABLET | ORAL | Status: DC | PRN

## 2015-11-21 MED ORDER — SENNOSIDES-DOCUSATE SODIUM 8.6-50 MG PO TABS: 2.0000 | ORAL_TABLET | ORAL | Status: DC

## 2015-11-21 MED ORDER — IBUPROFEN 600 MG PO TABS: 600.0000 mg | ORAL_TABLET | Freq: Four times a day (QID) | ORAL | Status: DC

## 2015-11-21 NOTE — Plan of Care (Signed)
MD talked with patient regarding follow-up for GDM with this RN present.  This RN further discussed and had patient teach back how GDM is related to T2DM.  Information printed from ADA web site (updated 2015 retrieved from: http://www.cox-reed.biz/) to reinforce this teaching.

## 2015-11-21 NOTE — Discharge Instructions (Signed)

## 2015-11-21 NOTE — Lactation Note (Signed)
This note was copied from the chart of Maureen Cameron. Lactation Consultation Note  Patient Name: Maureen Cameron QMVHQ'[id number] Date: 11/21/2015 Reason for consult: Follow-up assessment;Other (Comment);Infant weight loss (7% weightloss , Bili-check - low )  Baby has been breast feeding consistently ( see doc flow seed). @ consult had a large transitional stool, LC changed .  Baby rooting, LC assisted with latch and depth at the breast , multiply swallows noted, increased with breast compressions.  Breast are warmer and fuller today. LC reviewed basics, also mentioned to mom now her breast are filling to feed the baby long  Enough on the 1st breast to soften well and then if baby still hungry to offer 2nd breast. Mom receptive to teaching and seems to  Be more confident with latching compared to yesterday.     Maternal Data Has patient been taught Hand Expression?: Yes  Feeding Feeding Type: Breast Fed Length of feed:  (multiplty swallows and baby still feeding, still feeding after 7 mins )  LATCH Score/Interventions Latch: Grasps breast easily, tongue down, lips flanged, rhythmical sucking.  Audible Swallowing: Spontaneous and intermittent  Type of Nipple: Everted at rest and after stimulation  Comfort (Breast/Nipple): Filling, red/small blisters or bruises, mild/mod discomfort  Problem noted: Filling  Hold (Positioning): Assistance needed to correctly position infant at breast and maintain latch. Intervention(s): Breastfeeding basics reviewed;Support Pillows;Position options;Skin to skin  LATCH Score: 8  Lactation Tools Discussed/Used     Consult Status Consult Status: Follow-up Date: 11/22/15 Follow-up type: In-patient    Kathrin Greathouse 11/21/2015, 2:29 PM

## 2015-11-21 NOTE — Discharge Summary (Signed)
OB Discharge Summary     Patient Name: Maureen Cameron DOB: Apr 24, 1991 MRN: [id number]  Date of admission: 11/18/2015 Delivering MD: Reva Bores   Date of discharge: 11/21/2015  Admitting diagnosis: 37WKS, PAIN Intrauterine pregnancy: [redacted]w[redacted]d     Secondary diagnosis:  Active Problems:   Gestational diabetes mellitus (GDM) in third trimester  Additional problems:  Peripheral edema, isolated HTN, elevated Pr/Cr 3.73 - thought to be 2/2 fluids rather than pre-eclampsia Fevers/Triple I/Chorio      Discharge diagnosis: Term Pregnancy Delivered                                                                                                Post partum procedures:none  Augmentation: none  Complications: None  Hospital course:  Onset of Labor With Unplanned C/S  25 y.o. yo G1P1001 at [redacted]w[redacted]d was admitted in Active Labor on 11/18/2015. Patient had a labor course significant for fevers.  Pt was treated for triple I/chorio for 2 fevers prior to c/s with Tmax 101F @ 11AM on 11/19/15. Treated with amp, gent, and clinda x1 prior to c/s and cefoxitin x3 after c/s.  Membrane Rupture Time/Date: 9:48 PM ,11/18/2015   The patient went for cesarean section due to Arrest of Descent in second stage, and delivered a Viable infant,11/19/2015  Details of operation can be found in separate operative note. Postpartum pt had isolated elevated pressures 140s/90s on 11/19/15 and elevated protein creatinine ratio 3.73 with significant peripheral edema.  Given rapid improvement in BP - edema thought to be 2/2 fluids intraoperatively. Patient had an uncomplicated postpartum course without additional fevers or elevated BP.  She is ambulating,tolerating a regular diet, passing flatus, and urinating well.  Patient is discharged home in stable condition 11/21/2015.  Physical exam  Filed Vitals:   11/20/15 0906 11/20/15 1247 11/20/15 1854 11/21/15 0522  BP: 109/55 108/83 126/69 120/76  Pulse:  78 83 87  Temp: 97.9 F (36.6 C) 97.3  F (36.3 C) 98.2 F (36.8 C) 98.1 F (36.7 C)  TempSrc: Oral Oral Oral Oral  Resp: Height:      Weight:      SpO2: 97% 97%     General: alert, cooperative and no distress Lochia: appropriate Uterine Fundus: firm Incision: Healing well with no significant drainage, No significant erythema DVT Evaluation: No evidence of DVT seen on physical exam. Labs: Lab Results  Component Value Date   WBC 10.6* 11/20/2015   HGB 9.4* 11/20/2015   HCT 28.4* 11/20/2015   MCV 79.6 11/20/2015   PLT 156 11/20/2015   CMP Latest Ref Rng 11/19/2015  Glucose 65 - 99 mg/dL 86  BUN 6 - 20 mg/dL 8  Creatinine 0.86 - 5.78 mg/dL 4.69  Sodium 629 - 528 mmol/L 136  Potassium 3.5 - 5.1 mmol/L 3.6  Chloride 101 - 111 mmol/L 107  CO2 22 - 32 mmol/L 20(L)  Calcium 8.9 - 10.3 mg/dL 8.1(L)  Total Protein 6.5 - 8.1 g/dL 5.2(L)  Total Bilirubin 0.3 - 1.2 mg/dL 0.9  Alkaline Phos 38 - 126 U/L 128(H)  AST 15 -  41 U/L 36  ALT 14 - 54 U/L 31    Discharge instruction: per After Visit Summary and "Baby and Me Booklet".  After visit meds:    Medication List    STOP taking these medications        ACCU-CHEK FASTCLIX LANCETS Misc     glucose blood test strip  Commonly known as:  ACCU-CHEK SMARTVIEW     glyBURIDE 2.5 MG tablet  Commonly known as:  DIABETA      TAKE these medications        ibuprofen 600 MG tablet  Commonly known as:  ADVIL,MOTRIN  Take 1 tablet (600 mg total) by mouth every 6 (six) hours.     multivitamin-prenatal 27-0.8 MG Tabs tablet  Take 1 tablet by mouth daily at 12 noon.     oxyCODONE-acetaminophen 5-325 MG tablet  Commonly known as:  PERCOCET/ROXICET  Take 1-2 tablets by mouth every 4 (four) hours as needed (pain scale 4-7).     senna-docusate 8.6-50 MG tablet  Commonly known as:  Senokot-S  Take 2 tablets by mouth daily.        Diet: routine diet  Activity: Advance as tolerated. Pelvic rest for 6 weeks.   Outpatient follow up:6 weeks Follow up  Appt:Future Appointments Date Time Provider Department Center  11/22/2015 9:00 AM WH-MFC [place] 1 WH-US 203  11/22/2015 10:30 AM [facility] NST [facility] WOC   Follow up Visit:No Follow-up on file.  Follow-up Information    Follow up with [facility]. Schedule an appointment as soon as possible for a visit in 6 weeks.   Specialty:  Obstetrics and Gynecology   Why:  post partum check   Contact information:   [address] [place] [address] [phone]      Postpartum contraception: IUD Mirena  Newborn Data: Live born female  Birth Weight: 7 lb 11.1 oz (3490 g) APGAR: 9, 10  Baby Feeding: Breast Disposition:home with mother   11/21/2015 [name], MD   I spoke with and examined patient and agree with resident/PA/SNM's note and plan of care.  [name], CNM, [facility] 12/06/2015 1:07 PM

## 2015-11-22 ENCOUNTER — Ambulatory Visit (HOSPITAL_COMMUNITY): Payer: Medicaid Other

## 2015-11-22 ENCOUNTER — Other Ambulatory Visit: Payer: Medicaid Other

## 2015-11-22 MED ORDER — FUROSEMIDE 20 MG PO TABS
20.0000 mg | ORAL_TABLET | Freq: Once | ORAL | Status: AC
  Administered 2015-11-22: 20 mg via ORAL
  Filled 2015-11-22: qty 1

## 2015-11-22 NOTE — Discharge Summary (Signed)
OB Discharge Summary     Patient Name: Maureen Cameron DOB: [birth year] MRN: [id number]  Date of admission: 11/18/2015 25 y.o. MD: Reva Bores   Date of discharge: 11/22/2015  Admitting diagnosis: 37WKS, PAIN Intrauterine pregnancy: [redacted]w[redacted]d     Secondary diagnosis:  Active Problems:   Gestational diabetes mellitus (GDM) in third trimester  Additional problems:  Peripheral edema, isolated HTN, elevated Pr/Cr 3.73 - thought to be 2/2 fluids rather than pre-eclampsia Triple I     Discharge diagnosis: Term Pregnancy Delivered                                                                                                Post partum procedures:none  Augmentation: none  Complications: None  Hospital course:  Onset of Labor With Unplanned C/S  25 y.o. yo G1P1001 at [redacted]w[redacted]d was admitted in Active Labor on 11/18/2015. Patient had a labor course significant for fevers.  Pt was treated for triple I/chorio for 2 fevers prior to c/s with Tmax 101F @ 11AM on 11/19/15. Treated with amp, gent, and clinda x1 prior to c/s and cefoxitin x3 after c/s.  Membrane Rupture Time/Date: 9:48 PM ,11/18/2015   The patient went for cesarean section due to Arrest of Descent in second stage, and delivered a Viable infant,11/19/2015  Details of operation can be found in separate operative note. Postpartum pt had isolated elevated pressures 140s/90s on 11/19/15 and elevated protein creatinine ratio 3.73 with significant peripheral edema.  Given rapid improvement in BP - edema thought to be 2/2 fluids intraoperatively. Patient had an uncomplicated postpartum course without additional fevers and improvement in BP.  She is ambulating,tolerating a regular diet, passing flatus, and urinating well.  Patient is discharged home in stable condition 11/22/2015. Will arrange BP check this week at home with home nursing.  Physical exam  Filed Vitals:   11/20/15 1854 11/21/15 0522 11/21/15 1800 11/22/15 0638  BP: 126/69 120/76 148/89 133/89   Pulse: 83 87 67 61  Temp: 98.2 F (36.8 C) 98.1 F (36.7 C) 98.2 F (36.8 C) 98 F (36.7 C)  TempSrc: Oral Oral Oral Oral  Resp: Height:      Weight:      SpO2:       General: alert, cooperative and no distress Lochia: appropriate Uterine Fundus: firm Incision: Healing well with no significant drainage, No significant erythema DVT Evaluation: No evidence of DVT seen on physical exam.; 3+ pedal edema  Labs: Lab Results  Component Value Date   WBC 10.6* 11/20/2015   HGB 9.4* 11/20/2015   HCT 28.4* 11/20/2015   MCV 79.6 11/20/2015   PLT 156 11/20/2015   CMP Latest Ref Rng 11/19/2015  Glucose 65 - 99 mg/dL 86  BUN 6 - 20 mg/dL 8  Creatinine 4.09 - 8.11 mg/dL 9.14  Sodium 782 - 956 mmol/L 136  Potassium 3.5 - 5.1 mmol/L 3.6  Chloride 101 - 111 mmol/L 107  CO2 22 - 32 mmol/L 20(L)  Calcium 8.9 - 10.3 mg/dL 8.1(L)  Total Protein 6.5 - 8.1 g/dL 5.2(L)  Total Bilirubin 0.3 -  1.2 mg/dL 0.9  Alkaline Phos 38 - 126 U/L 128(H)  AST 15 - 41 U/L 36  ALT 14 - 54 U/L 31    Discharge instruction: per After Visit Summary and "Baby and Me Booklet".  After visit meds:    Medication List    STOP taking these medications        ACCU-CHEK FASTCLIX LANCETS Misc     glucose blood test strip  Commonly known as:  ACCU-CHEK SMARTVIEW     glyBURIDE 2.5 MG tablet  Commonly known as:  DIABETA      TAKE these medications        ibuprofen 600 MG tablet  Commonly known as:  ADVIL,MOTRIN  Take 1 tablet (600 mg total) by mouth every 6 (six) hours.     multivitamin-prenatal 27-0.8 MG Tabs tablet  Take 1 tablet by mouth daily at 12 noon.     oxyCODONE-acetaminophen 5-325 MG tablet  Commonly known as:  PERCOCET/ROXICET  Take 1-2 tablets by mouth every 4 (four) hours as needed (pain scale 4-7).     senna-docusate 8.6-50 MG tablet  Commonly known as:  Senokot-S  Take 2 tablets by mouth daily.        Diet: routine diet  Activity: Advance as tolerated. Pelvic rest  for 6 weeks.   Outpatient follow up:6 weeks Follow up Appt: Future Appointments Date Time Provider Department Center  11/22/2015 9:00 AM WH-[facility] [place] 1 WH-US 203  11/22/2015 10:30 AM WOC-[facility] NST WOC-[facility] WOC   Follow up Visit:No Follow-up on file.  Follow-up Information    Follow up with [facility]. Schedule an appointment as soon as possible for a visit in 6 weeks.   Specialty:  Obstetrics and Gynecology   Why:  post partum check   Contact information:   [address] [place] [address] [phone]      Postpartum contraception: IUD Mirena  Newborn Data: Live born female  Birth Weight: 7 lb 11.1 oz (3490 g) APGAR: 9, 10  Baby Feeding: Breast Disposition:home with mother   11/22/2015 [name], MD   OB FELLOW DISCHARGE ATTESTATION  I have seen and examined this patient and agree with above documentation in the resident's note.   Silvano Bilis, MD 7:46 AM

## 2015-11-22 NOTE — Lactation Note (Signed)
This note was copied from the chart of Girl Sheyann Haws. Lactation Consultation Note  Patient Name: Girl Cheryln Balcom [id number] Date: 11/22/2015 Reason for consult: Follow-up assessment  Baby 17 hours old. Mom reports that baby is latching and nursing better and she is hearing swallows with the last couple of feedings. Baby cueing to nurse, so assisted mom to latch baby in football position to right breast. Demonstrated positioning with pillows. Baby latched deeply, suckling rhythmically with lips flanged and intermittent swallows noted. Baby nursed well for 15 minutes, and mom reported no nipple discomfort. Examined nipple when baby came off breast, and there was no pinching. Mom reports that right breast feels softer, but there are still firm areas. Mom states that she does not have a DEBP, but is going to call [place] back when she knows that she will be discharged. Mom aware of [facility] loaner program, but not wanting a pump at this time. Demonstrated how to use piston in pumping kit, and assisted mom to pump with manual pump. Mom is easily expressible. Enc mom to massage breast while baby nursing or while mom using pump/hand expressing. Discussed the need to keep milk flowing from breast. Discussed engorgement prevention/treatment.  Mom aware of OP/BFSG and [place] phone line assistance after D/C. Enc mom to nurse with cues and keep milk flowing.  Maternal Data    Feeding Feeding Type: Breast Fed Length of feed: 15 min  LATCH Score/Interventions Latch: Grasps breast easily, tongue down, lips flanged, rhythmical sucking. Intervention(s): Adjust position  Audible Swallowing: Spontaneous and intermittent Intervention(s): Skin to skin;Hand expression  Type of Nipple: Everted at rest and after stimulation  Comfort (Breast/Nipple): Filling, red/small blisters or bruises, mild/mod discomfort Problem noted: Cracked, bleeding, blisters, bruises Intervention(s): Hand expression     Hold (Positioning):  Assistance needed to correctly position infant at breast and maintain latch. Intervention(s): Breastfeeding basics reviewed;Support Pillows;Position options;Skin to skin  LATCH Score: 8  Lactation Tools Discussed/Used Tools: Nipple Shields Nipple shield size: 24   Consult Status Consult Status: PRN    Lesli Albee, Crystallynn Noorani 11/22/2015, 11:07 AM

## 2015-11-22 NOTE — Lactation Note (Signed)
This note was copied from the chart of Maureen Cameron. Lactation Consultation Note RN notified LC of baby wanting to BF constantly, mom tearful, stating nipples hurting bad, can't put baby to breast any longer needs to rest. RN watched baby in Nursery. Baby's mouth dry, lips chapped, baby acting like it was starving. 65 hrs old, had 10 voids and 8 stools, minimal adequate for hrs. Old. RN gave formula with permission from mom. Baby rested well. Assessed moms breast for colostrum, moms breast were getting engorged. Small breast tight, tender, knots, full ridge approx. 2 1/2 inches from collar bone. Mom shown how to use DEBP & how to disassemble, clean, & reassemble parts. Mom knows to pump q3h for 15-20 min. Breast massage knots, mom was wearing bra. Mom pumped 20ml. Gave baby 5 ml w/gloved finger and syring, and 5 in NS. Fitted mom w/#24 NS and #20 NS d/t painful latching. Mom has great everted nipples. Mom states both NS feel fine. #20 NS nipple fills up half way. Mom states its not to tight and feels fine. Baby has heart shaped tongue when cries, tongue curls some when cries. Can move tongue out slightly past gum line. Wondering as much as baby is BF, has dry mouth and lips is baby getting a good transfer from breast. With out put we know baby is getting some. Reported to Nursery RN to notify MD when come in to make rounds to check baby's tongue.  Moms breast softend between pumping and BF.  Patient Name: Maureen Cameron [id number] Date: 11/22/2015 Reason for consult: Follow-up assessment;Breast/nipple pain;Infant weight loss   Maternal Data    Feeding Feeding Type: Breast Milk Nipple Type: Slow - flow Length of feed: 40 min  LATCH Score/Interventions Latch: Grasps breast easily, tongue down, lips flanged, rhythmical sucking.  Audible Swallowing: Spontaneous and intermittent Intervention(s): Skin to skin;Hand expression Intervention(s): Alternate breast massage  Type of Nipple: Everted at  rest and after stimulation  Comfort (Breast/Nipple): Engorged, cracked, bleeding, large blisters, severe discomfort Problem noted: Engorgment Intervention(s): Hand expression  Problem noted: Mild/Moderate discomfort Interventions (Filling): Double electric pump;Frequent nursing;Firm support;Massage Interventions (Mild/moderate discomfort): Hand expression;Hand massage;Post-pump;Comfort gels;Breast shields Interventions (Severe discomfort): Double electric pum  Hold (Positioning): Assistance needed to correctly position infant at breast and maintain latch. Intervention(s): Skin to skin;Position options;Support Pillows;Breastfeeding basics reviewed  LATCH Score: 7  Lactation Tools Discussed/Used Tools: Nipple Shields;Pump;Comfort gels Nipple shield size: 20;24 Breast pump type: Double-Electric Breast Pump Pump Review: Setup, frequency, and cleaning;Milk Storage Initiated by:: Peri Jefferson RN Date initiated:: 11/22/15   Consult Status Consult Status: Follow-up Date: 11/22/15 Follow-up type: In-patient    Charyl Dancer 11/22/2015, 7:55 AM

## 2015-11-23 NOTE — Progress Notes (Signed)
Post discharge chart review completed.  

## 2015-11-28 ENCOUNTER — Inpatient Hospital Stay (HOSPITAL_COMMUNITY): Admission: RE | Admit: 2015-11-28 | Payer: Medicaid Other | Source: Ambulatory Visit

## 2016-01-06 ENCOUNTER — Ambulatory Visit (INDEPENDENT_AMBULATORY_CARE_PROVIDER_SITE_OTHER): Payer: Medicaid Other | Admitting: Obstetrics & Gynecology

## 2016-01-06 ENCOUNTER — Encounter: Payer: Self-pay | Admitting: Obstetrics & Gynecology

## 2016-01-06 DIAGNOSIS — Z30017 Encounter for initial prescription of implantable subdermal contraceptive: Secondary | ICD-10-CM | POA: Diagnosis not present

## 2016-01-06 DIAGNOSIS — Z8632 Personal history of gestational diabetes: Secondary | ICD-10-CM

## 2016-01-06 LAB — POCT URINALYSIS DIP (DEVICE)
BILIRUBIN URINE: NEGATIVE
Glucose, UA: NEGATIVE mg/dL
KETONES UR: NEGATIVE mg/dL
LEUKOCYTES UA: NEGATIVE
Nitrite: NEGATIVE
PH: 5 (ref 5.0–8.0)
Protein, ur: NEGATIVE mg/dL
Specific Gravity, Urine: 1.025 (ref 1.005–1.030)
Urobilinogen, UA: 0.2 mg/dL (ref 0.0–1.0)

## 2016-01-06 LAB — POCT PREGNANCY, URINE: Preg Test, Ur: NEGATIVE

## 2016-01-06 MED ORDER — ETONOGESTREL 68 MG ~~LOC~~ IMPL: 68.0000 mg | DRUG_IMPLANT | Freq: Once | SUBCUTANEOUS | Status: DC

## 2016-01-06 NOTE — Patient Instructions (Signed)
Etonogestrel implant What is this medicine? ETONOGESTREL (et oh noe JES trel) is a contraceptive (birth control) device. It is used to prevent pregnancy. It can be used for up to 3 years. This medicine may be used for other purposes; ask your health care provider or pharmacist if you have questions. What should I tell my health care provider before I take this medicine? They need to know if you have any of these conditions: -abnormal vaginal bleeding -blood vessel disease or blood clots -cancer of the breast, cervix, or liver -depression -diabetes -gallbladder disease -headaches -heart disease or recent heart attack -high blood pressure -high cholesterol -kidney disease -liver disease -renal disease -seizures -tobacco smoker -an unusual or allergic reaction to etonogestrel, other hormones, anesthetics or antiseptics, medicines, foods, dyes, or preservatives -pregnant or trying to get pregnant -breast-feeding How should I use this medicine? This device is inserted just under the skin on the inner side of your upper arm by a health care professional. Talk to your pediatrician regarding the use of this medicine in children. Special care may be needed. Overdosage: If you think you have taken too much of this medicine contact a poison control center or emergency room at once. NOTE: This medicine is only for you. Do not share this medicine with others. What if I miss a dose? This does not apply. What may interact with this medicine? Do not take this medicine with any of the following medications: -amprenavir -bosentan -fosamprenavir This medicine may also interact with the following medications: -barbiturate medicines for inducing sleep or treating seizures -certain medicines for fungal infections like ketoconazole and itraconazole -griseofulvin -medicines to treat seizures like carbamazepine, felbamate, oxcarbazepine, phenytoin,  topiramate -modafinil -phenylbutazone -rifampin -some medicines to treat HIV infection like atazanavir, indinavir, lopinavir, nelfinavir, tipranavir, ritonavir -St. John's wort This list may not describe all possible interactions. Give your health care provider a list of all the medicines, herbs, non-prescription drugs, or dietary supplements you use. Also tell them if you smoke, drink alcohol, or use illegal drugs. Some items may interact with your medicine. What should I watch for while using this medicine? This product does not protect you against HIV infection (AIDS) or other sexually transmitted diseases. You should be able to feel the implant by pressing your fingertips over the skin where it was inserted. Contact your doctor if you cannot feel the implant, and use a non-hormonal birth control method (such as condoms) until your doctor confirms that the implant is in place. If you feel that the implant may have broken or become bent while in your arm, contact your healthcare provider. What side effects may I notice from receiving this medicine? Side effects that you should report to your doctor or health care professional as soon as possible: -allergic reactions like skin rash, itching or hives, swelling of the face, lips, or tongue -breast lumps -changes in emotions or moods -depressed mood -heavy or prolonged menstrual bleeding -pain, irritation, swelling, or bruising at the insertion site -scar at site of insertion -signs of infection at the insertion site such as fever, and skin redness, pain or discharge -signs of pregnancy -signs and symptoms of a blood clot such as breathing problems; changes in vision; chest pain; severe, sudden headache; pain, swelling, warmth in the leg; trouble speaking; sudden numbness or weakness of the face, arm or leg -signs and symptoms of liver injury like dark yellow or brown urine; general ill feeling or flu-like symptoms; light-colored stools; loss of  appetite; nausea; right upper belly   pain; unusually weak or tired; yellowing of the eyes or skin -unusual vaginal bleeding, discharge -signs and symptoms of a stroke like changes in vision; confusion; trouble speaking or understanding; severe headaches; sudden numbness or weakness of the face, arm or leg; trouble walking; dizziness; loss of balance or coordination Side effects that usually do not require medical attention (Report these to your doctor or health care professional if they continue or are bothersome.): -acne -back pain -breast pain -changes in weight -dizziness -general ill feeling or flu-like symptoms -headache -irregular menstrual bleeding -nausea -sore throat -vaginal irritation or inflammation This list may not describe all possible side effects. Call your doctor for medical advice about side effects. You may report side effects to FDA at 1-800-FDA-1088. Where should I keep my medicine? This drug is given in a hospital or clinic and will not be stored at home. NOTE: This sheet is a summary. It may not cover all possible information. If you have questions about this medicine, talk to your doctor, pharmacist, or health care provider.    2016, Elsevier/Gold Standard. (2014-07-17 14:07:06)  

## 2016-01-06 NOTE — Progress Notes (Signed)
  Subjective:     Maureen Cameron is a 25 y.o. 111P1001 female who presents for a postpartum visit. She is 7 weeks postpartum following a LTCS for FTP. I have fully reviewed the prenatal and intrapartum course; patient had A2GDM. The delivery was at 38 gestational weeks.  Postpartum course has been uncomplicated. Baby's course has been uncomplicated. Baby is feeding by breast. Bleeding staining only. Bowel function is normal. Bladder function is normal. Patient is not sexually active. Desired contraception method is Nexplanon. Postpartum depression screening: negative.  The following portions of the patient's history were reviewed and updated as appropriate: allergies, current medications, past family history, past medical history, past social history, past surgical history and problem list.  Review of Systems Pertinent items noted in HPI and remainder of comprehensive ROS otherwise negative.   Objective:    BP 124/79 mmHg  Pulse 67  Temp(Src) 97.7 F (36.5 C) (Oral)  Ht 5\' 1"  (1.549 m)  Wt 117 lb (53.071 kg)  BMI 22.12 kg/m2  General:  alert and no distress   Breasts:  inspection negative, no nipple discharge or bleeding, no masses or nodularity palpable  Lungs: clear to auscultation bilaterally  Heart:  regular rate and rhythm  Abdomen: soft, non-tender; bowel sounds normal; no masses,  no organomegaly. Incision C/D/I, no erythema, no drainage.     Nexplanon Insertion Procedure Patient identified, informed consent performed, consent signed.   Patient does understand that irregular bleeding is a very common side effect of this medication. She was advised to have backup contraception for one week after placement. Pregnancy test in clinic today was negative.  Appropriate time out taken.  Patient's left arm was prepped and draped in the usual sterile fashion. The ruler used to measure and mark insertion area.  Patient was prepped with alcohol swab and then injected with 3 ml of 1% lidocaine.  She  was prepped with betadine, Nexplanon removed from packaging,  Device confirmed in needle, then inserted full length of needle and withdrawn per handbook instructions. Nexplanon was able to palpated in the patient's arm; patient palpated the insert herself. There was minimal blood loss.  Patient insertion site covered with guaze and a pressure bandage to reduce any bruising.  The patient tolerated the procedure well and was given post procedure instructions.     Assessment:   Normal postpartum exam  Nexplanon placed  Plan:   1. Contraception: Nexplanon placed today 2. Follow up soon for 2 hr GTT given h/o A2GDM.    Maureen CollinsUGONNA  Chief Walkup, MD, FACOG Attending Obstetrician & Gynecologist, [facility]

## 2016-01-07 ENCOUNTER — Other Ambulatory Visit: Payer: Medicaid Other

## 2016-01-07 DIAGNOSIS — O99815 Abnormal glucose complicating the puerperium: Secondary | ICD-10-CM

## 2016-01-08 LAB — GLUCOSE TOLERANCE, 2 HOURS
GLUCOSE, 2 HOUR: 68 mg/dL (ref <140)
Glucose, Fasting: 81 mg/dL (ref 65–99)

## 2017-02-07 ENCOUNTER — Ambulatory Visit (INDEPENDENT_AMBULATORY_CARE_PROVIDER_SITE_OTHER): Payer: Medicaid Other | Admitting: Obstetrics and Gynecology

## 2017-02-07 ENCOUNTER — Encounter: Payer: Self-pay | Admitting: Obstetrics and Gynecology

## 2017-02-07 VITALS — BP 114/70 | HR 76 | Ht 65.0 in | Wt 126.5 lb

## 2017-02-07 DIAGNOSIS — Z3046 Encounter for surveillance of implantable subdermal contraceptive: Secondary | ICD-10-CM

## 2017-02-07 DIAGNOSIS — Z3049 Encounter for surveillance of other contraceptives: Secondary | ICD-10-CM | POA: Diagnosis not present

## 2017-02-07 NOTE — Patient Instructions (Signed)

## 2017-02-07 NOTE — Progress Notes (Signed)
     GYNECOLOGY OFFICE PROCEDURE NOTE  Maureen Cameron is a 26 y.o. G1P1001 here for Nexplanon removal. She states that she wants it removed due to her bleeding pattern.   No other gynecologic concerns.  Nexplanon Removal Patient identified, informed consent performed, consent signed.   Appropriate time out taken. Nexplanon site identified.  Area prepped in usual sterile fashon. One ml of 1% lidocaine w/ epi was used to anesthetize the area at the distal end of the implant. A small stab incision was made right beside the implant on the distal portion.  The Nexplanon rod was grasped using hemostats and removed without difficulty.  There was minimal blood loss. There were no complications.  Steri-strips were applied over the small incision.  A pressure bandage was applied to reduce any bruising.  The patient tolerated the procedure well and was given post procedure instructions.  Patient is planning to use nothing for contraception/attempt conception.    Ernestina Penna MD OB Fellow [facility] and Center for [employer]

## 2018-08-30 ENCOUNTER — Ambulatory Visit (HOSPITAL_COMMUNITY)
Admission: EM | Admit: 2018-08-30 | Discharge: 2018-08-30 | Disposition: A | Discharge status: Home / Self Care | Payer: Self-pay | Attending: Internal Medicine | Admitting: Internal Medicine

## 2018-08-30 ENCOUNTER — Encounter (HOSPITAL_COMMUNITY): Payer: Self-pay | Admitting: Emergency Medicine

## 2018-08-30 ENCOUNTER — Other Ambulatory Visit: Payer: Self-pay

## 2018-08-30 DIAGNOSIS — R05 Cough: Secondary | ICD-10-CM

## 2018-08-30 DIAGNOSIS — H1033 Unspecified acute conjunctivitis, bilateral: Secondary | ICD-10-CM

## 2018-08-30 DIAGNOSIS — H109 Unspecified conjunctivitis: Secondary | ICD-10-CM

## 2018-08-30 DIAGNOSIS — B9789 Other viral agents as the cause of diseases classified elsewhere: Secondary | ICD-10-CM

## 2018-08-30 DIAGNOSIS — J069 Acute upper respiratory infection, unspecified: Secondary | ICD-10-CM

## 2018-08-30 MED ORDER — CETIRIZINE HCL 10 MG PO CAPS: 10.0000 mg | ORAL_CAPSULE | Freq: Every day | ORAL | 0 refills | Status: DC

## 2018-08-30 MED ORDER — POLYMYXIN B-TRIMETHOPRIM 10000-0.1 UNIT/ML-% OP SOLN: 1.0000 [drp] | OPHTHALMIC | 0 refills | Status: DC

## 2018-08-30 MED ORDER — PSEUDOEPH-BROMPHEN-DM 30-2-10 MG/5ML PO SYRP: 5.0000 mL | ORAL_SOLUTION | Freq: Four times a day (QID) | ORAL | 0 refills | Status: DC | PRN

## 2018-08-30 MED ORDER — AMOXICILLIN-POT CLAVULANATE 875-125 MG PO TABS: 1.0000 | ORAL_TABLET | Freq: Two times a day (BID) | ORAL | 0 refills | Status: AC

## 2018-08-30 MED ORDER — FLUTICASONE PROPIONATE 50 MCG/ACT NA SUSP: 1.0000 | Freq: Every day | NASAL | 0 refills | Status: DC

## 2018-08-30 NOTE — Discharge Instructions (Signed)
For your congestion please begin Zyrtec and Flonase daily to help with congestion, drainage and nasal swelling Please use cough syrup provided as needed for cough every 8 hours, may also use Robitussin or Delsym over-the-counter Please use Polytrim eyedrops in both eyes every 4 hours  Please use the above consistently if not having any improvement in congestion please fill prescription for Augmentin on Monday

## 2018-08-30 NOTE — ED Triage Notes (Signed)
Pt reports nasal congestion and a cough x4 days.  Pt has since developed bilateral eye redness and drainage.  She has been taking Mucinex with no relief.

## 2018-08-31 NOTE — ED Provider Notes (Signed)
[facility]    CSN: [id number] Arrival date & time: 08/30/18  1905     History   Chief Complaint Chief Complaint  Patient presents with  . URI    HPI Maureen Cameron is a 27 y.o. female noncontributory medical history, Patient is presenting with URI symptoms- congestion, cough, sore throat.  Patient is concerned that she has developed bilateral eye redness and drainage.  Patient's main complaints are congestion. Symptoms have been going on for 4 days. Patient has tried Mucinex, with minimal relief. Denies fever, nausea, vomiting, diarrhea. Denies shortness of breath and chest pain.    HPI  Past Medical History:  Diagnosis Date  . Gestational diabetes   . History of  gestational diabetes 11/18/2015  . Medical history non-contributory     Patient Active Problem List   Diagnosis Date Noted  . History of  gestational diabetes 11/18/2015  . Maternal varicella, non-immune 10/04/2015    Past Surgical History:  Procedure Laterality Date  . CESAREAN SECTION N/A 11/19/2015   Procedure: CESAREAN SECTION;  Surgeon: Reva Bores, MD;  Location: [facility];  Service: Obstetrics;  Laterality: N/A;  . NO PAST SURGERIES      OB History    Gravida  1   Para  1   Term  1   Preterm  0   AB  0   Living  1     SAB  0   TAB  0   Ectopic  0   Multiple  0   Live Births  1            Home Medications    Prior to Admission medications   Medication Sig Start Date End Date Taking? Authorizing Provider  amoxicillin-clavulanate (AUGMENTIN) 875-125 MG tablet Take 1 tablet by mouth every 12 (twelve) hours for 10 days. 09/02/18 09/12/18  Wendolyn Raso C, PA-C  brompheniramine-pseudoephedrine-DM 30-2-10 MG/5ML syrup Take 5 mLs by mouth 4 (four) times daily as needed. 08/30/18   Jeronimo Hellberg C, PA-C  Cetirizine HCl 10 MG CAPS Take 1 capsule (10 mg total) by mouth daily for 10 days. 08/30/18 09/09/18  Ibrahem Volkman C, PA-C  fluticasone (FLONASE) 50 MCG/ACT nasal  spray Place 1-2 sprays into both nostrils daily for 7 days. 08/30/18 09/06/18  Terelle Dobler C, PA-C  Prenatal Vit-Fe Fumarate-FA (MULTIVITAMIN-PRENATAL) 27-0.8 MG TABS tablet Take 1 tablet by mouth daily at 12 noon. Reported on 01/06/2016    [provider]  trimethoprim-polymyxin b (POLYTRIM) ophthalmic solution Place 1 drop into both eyes every 4 (four) hours. 08/30/18   Khristi Schiller, Junius Creamer, PA-C    Family History Family History  Problem Relation Age of Onset  . Hypertension Mother   . Diabetes Mother   . Hypertension Father     Social History Social History   Tobacco Use  . Smoking status: Never Smoker  . Smokeless tobacco: Never Used  Substance Use Topics  . Alcohol use: No  . Drug use: No     Allergies   Patient has no known allergies.   Review of Systems Review of Systems  Constitutional: Negative for activity change, appetite change, chills, fatigue and fever.  HENT: Positive for congestion, rhinorrhea and sore throat. Negative for ear pain, sinus pressure and trouble swallowing.   Eyes: Positive for discharge and redness. Negative for photophobia, pain, itching and visual disturbance.  Respiratory: Positive for cough. Negative for chest tightness and shortness of breath.   Cardiovascular: Negative for chest pain.  Gastrointestinal: Negative  for abdominal pain, diarrhea, nausea and vomiting.  Musculoskeletal: Negative for myalgias.  Skin: Negative for rash.  Neurological: Negative for dizziness, light-headedness and headaches.     Physical Exam Triage Vital Signs ED Triage Vitals  Enc Vitals Group     BP 08/30/18 1922 114/75     Pulse Rate 08/30/18 1922 (!) 106     Resp --      Temp 08/30/18 1922 99.2 F (37.3 C)     Temp Source 08/30/18 1922 Oral     SpO2 08/30/18 1922 100 %     Weight --      Height --      Head Circumference --      Peak Flow --      Pain Score 08/30/18 1920 6     Pain Loc --      Pain Edu? --      Excl. in GC? --     No data found.  Updated Vital Signs BP 114/75 (BP Location: Left Arm)   Pulse (!) 106   Temp 99.2 F (37.3 C) (Oral)   LMP 08/07/2018 (Exact Date)   SpO2 100%   Visual Acuity Right Eye Distance:   Left Eye Distance:   Bilateral Distance:    Right Eye Near:   Left Eye Near:    Bilateral Near:     Physical Exam  Constitutional: She appears well-developed and well-nourished. No distress.  HENT:  Head: Normocephalic and atraumatic.  Bilateral ears without tenderness to palpation of external auricle, tragus and mastoid, EAC's without erythema or swelling, TM's with good bony landmarks and cone of light. Non erythematous. Nasal mucosa erythematous, swollen turbinates with clear rhinorrhea Oral mucosa pink and moist, no tonsillar enlargement or exudate. Posterior pharynx patent and erythematous, no uvula deviation or swelling. Normal phonation.  Eyes: Pupils are equal, round, and reactive to light. Conjunctivae and EOM are normal.  Bilateral eyes with conjunctival erythema, right worse than left, thick green discharge in medial canthus of right eye  Neck: Neck supple.  Cardiovascular: Normal rate and regular rhythm.  No murmur heard. Pulmonary/Chest: Effort normal and breath sounds normal. No respiratory distress.  Breathing comfortably at rest, CTABL, no wheezing, rales or other adventitious sounds auscultated  Abdominal: Soft. There is no tenderness.  Musculoskeletal: She exhibits no edema.  Neurological: She is alert.  Skin: Skin is warm and dry.  Psychiatric: She has a normal mood and affect.  Nursing note and vitals reviewed.    UC Treatments / Results  Labs (all labs ordered are listed, but only abnormal results are displayed) Labs Reviewed - No data to display  EKG None  Radiology No results found.  Procedures Procedures (including critical care time)  Medications Ordered in UC Medications - No data to display  Initial Impression / Assessment and Plan /  UC Course  I have reviewed the triage vital signs and the nursing notes.  Pertinent labs & imaging results that were available during my care of the patient were reviewed by me and considered in my medical decision making (see chart for details).    Patient appears to have bacterial conjunctivitis, will treat with Polytrim, also with URI symptoms, most likely viral.  Will recommend continue symptomatic management.  Zyrtec and Flonase for nasal congestion and drainage, cough syrup as needed for cough.  Provided printed prescription for antibiotic to fill on Monday if not having any improvement with continued symptomatic management.Discussed strict return precautions. Patient verbalized understanding and is agreeable with  plan.  Final Clinical Impressions(s) / UC Diagnoses   Final diagnoses:  Viral URI with cough  Bacterial conjunctivitis     Discharge Instructions     For your congestion please begin Zyrtec and Flonase daily to help with congestion, drainage and nasal swelling Please use cough syrup provided as needed for cough every 8 hours, may also use Robitussin or Delsym over-the-counter Please use Polytrim eyedrops in both eyes every 4 hours  Please use the above consistently if not having any improvement in congestion please fill prescription for Augmentin on Monday   ED Prescriptions    Medication Sig Dispense Auth. Provider   trimethoprim-polymyxin b (POLYTRIM) ophthalmic solution Place 1 drop into both eyes every 4 (four) hours. 10 mL Breelynn Bankert C, PA-C   Cetirizine HCl 10 MG CAPS Take 1 capsule (10 mg total) by mouth daily for 10 days. 10 capsule Claudell Rhody C, PA-C   fluticasone (FLONASE) 50 MCG/ACT nasal spray Place 1-2 sprays into both nostrils daily for 7 days. 1 g Izek Corvino C, PA-C   brompheniramine-pseudoephedrine-DM 30-2-10 MG/5ML syrup Take 5 mLs by mouth 4 (four) times daily as needed. 120 mL Jahki Witham C, PA-C   amoxicillin-clavulanate  (AUGMENTIN) 875-125 MG tablet Take 1 tablet by mouth every 12 (twelve) hours for 10 days. 20 tablet Traniyah Hallett, ShepherdHallie C, PA-C     Controlled Substance Prescriptions [place] Controlled Substance Registry consulted? Not Applicable   Lew DawesWieters, Taeko Schaffer C, [place]PA-C 08/31/18 1643

## 2020-04-14 ENCOUNTER — Other Ambulatory Visit (HOSPITAL_COMMUNITY)
Admission: RE | Admit: 2020-04-14 | Discharge: 2020-04-14 | Disposition: A | Discharge status: Home / Self Care | Payer: Medicaid Other | Source: Ambulatory Visit | Attending: Family Medicine | Admitting: Family Medicine

## 2020-04-14 ENCOUNTER — Other Ambulatory Visit: Payer: Self-pay | Admitting: Family Medicine

## 2020-04-14 ENCOUNTER — Other Ambulatory Visit: Payer: Self-pay

## 2020-04-14 ENCOUNTER — Ambulatory Visit (INDEPENDENT_AMBULATORY_CARE_PROVIDER_SITE_OTHER): Payer: Medicaid Other | Admitting: Family Medicine

## 2020-04-14 VITALS — BP 124/78 | HR 106 | Ht 64.0 in | Wt 138.0 lb

## 2020-04-14 DIAGNOSIS — Z3A17 17 weeks gestation of pregnancy: Secondary | ICD-10-CM

## 2020-04-14 DIAGNOSIS — Z8632 Personal history of gestational diabetes: Secondary | ICD-10-CM | POA: Diagnosis not present

## 2020-04-14 DIAGNOSIS — O0992 Supervision of high risk pregnancy, unspecified, second trimester: Secondary | ICD-10-CM

## 2020-04-14 DIAGNOSIS — Z98891 History of uterine scar from previous surgery: Secondary | ICD-10-CM

## 2020-04-14 DIAGNOSIS — O0990 Supervision of high risk pregnancy, unspecified, unspecified trimester: Secondary | ICD-10-CM | POA: Insufficient documentation

## 2020-04-14 DIAGNOSIS — O34219 Maternal care for unspecified type scar from previous cesarean delivery: Secondary | ICD-10-CM

## 2020-04-14 MED ORDER — BLOOD PRESSURE KIT DEVI: 1.0000 | Freq: Every day | 0 refills | Status: DC

## 2020-04-14 NOTE — Patient Instructions (Signed)
[address] to [employer] up Blood Pressure Cuff from [employer].

## 2020-04-14 NOTE — Progress Notes (Signed)
Subjective:  Maureen Cameron is a G2P1001 [redacted]w[redacted]d being seen today for her first obstetrical visit.  Her obstetrical history is significant for history of GDM, history of cesarean section for arrest of descent. Planned pregnancy. Patient does intend to breast feed. Pregnancy history fully reviewed.  Patient reports no complaints.  BP 124/78   Pulse (!) 106   Ht 5\' 4"  (1.626 m)   Wt 138 lb (62.6 kg)   LMP 12/12/2019   BMI 23.69 kg/m   HISTORY: OB History  Gravida Para Term Preterm AB Living  2 1 1  0 0 1  SAB TAB Ectopic Multiple Live Births  0 0 0 0 1    # Outcome Date GA Lbr Len/2nd Weight Sex Delivery Anes PTL Lv  2 Current           1 Term 11/19/15 [redacted]w[redacted]d 26:28 / 09:02 7 lb 11.1 oz (3.49 kg) F CS-LTranv EPI  LIV    Past Medical History:  Diagnosis Date  . Gestational diabetes   . History of  gestational diabetes 11/18/2015  . Medical history non-contributory     Past Surgical History:  Procedure Laterality Date  . CESAREAN SECTION N/A 11/19/2015   Procedure: CESAREAN SECTION;  Surgeon: 01/16/2016, MD;  Location: [facility];  Service: Obstetrics;  Laterality: N/A;  . NO PAST SURGERIES      Family History  Problem Relation Age of Onset  . Hypertension Mother   . Diabetes Mother   . Hypertension Father      Exam  BP 124/78   Pulse (!) 106   Ht 5\' 4"  (1.626 m)   Wt 138 lb (62.6 kg)   LMP 12/12/2019   BMI 23.69 kg/m   Chaperone present during exam  CONSTITUTIONAL: Well-developed, well-nourished female in no acute distress.  HENT:  Normocephalic, atraumatic, External right and left ear normal. Oropharynx is clear and moist EYES: Conjunctivae and EOM are normal. Pupils are equal, round, and reactive to light. No scleral icterus.  NECK: Normal range of motion, supple, no masses.  Normal thyroid.  CARDIOVASCULAR: Normal heart rate noted, regular rhythm RESPIRATORY: Clear to auscultation bilaterally. Effort and breath sounds normal, no problems with respiration  noted. BREASTS: Symmetric in size. No masses, skin changes, nipple drainage, or lymphadenopathy. ABDOMEN: Soft, normal bowel sounds, no distention noted.  No tenderness, rebound or guarding.  PELVIC: Normal appearing external genitalia; normal appearing vaginal mucosa and cervix. No abnormal discharge noted. Normal uterine size, no other palpable masses, no uterine or adnexal tenderness. MUSCULOSKELETAL: Normal range of motion. No tenderness.  No cyanosis, clubbing, or edema.  2+ distal pulses. SKIN: Skin is warm and dry. No rash noted. Not diaphoretic. No erythema. No pallor. NEUROLOGIC: Alert and oriented to person, place, and time. Normal reflexes, muscle tone coordination. No cranial nerve deficit noted. PSYCHIATRIC: Normal mood and affect. Normal behavior. Normal judgment and thought content.    Assessment:    Pregnancy: G2P1001 Patient Active Problem List   Diagnosis Date Noted  . Supervision of high risk pregnancy, antepartum 04/14/2020  . History of  gestational diabetes 11/18/2015  . Maternal varicella, non-immune 10/04/2015      Plan:   1. Supervision of high risk pregnancy, antepartum Wants genetic testing. Schedule 04/16/2020 - Genetic Screening - Hemoglobin A1c - Culture, OB Urine - Comprehensive metabolic panel - 01/16/2016 MFM OB DETAIL +14 WK - 10/06/2015 Fetal Echocardiography - Cytology - PAP( [place]) - CBC/D/Plt+RPR+Rh+ABO+Rub Ab... - Glucose Tolerance, 2 Hours w/1 Hour; Future  2.  History of gestational diabetes Early 2hr GTT. Start ASA 81mg   - Genetic Screening - Hemoglobin A1c - Culture, OB Urine - Comprehensive metabolic panel - MFM OB DETAIL +14 WK - US Fetal Echocardiography - Cytology - PAP( [place]) - CBC/D/Plt+RPR+Rh+ABO+Rub Ab... - Glucose Tolerance, 2 Hours w/1 Hour; Future  3. History of cesarean delivery Desires TOLAC     Problem list reviewed and updated. 75% of 30 min visit spent on counseling and coordination of care.     [place] 04/14/2020

## 2020-04-15 ENCOUNTER — Encounter: Payer: Self-pay | Admitting: General Practice

## 2020-04-15 LAB — CBC/D/PLT+RPR+RH+ABO+RUB AB...
Antibody Screen: NEGATIVE
Basophils Absolute: 0 10*3/uL (ref 0.0–0.2)
Basos: 0 %
EOS (ABSOLUTE): 0.1 10*3/uL (ref 0.0–0.4)
Eos: 1 %
HCV Ab: <0.1 s/co ratio (ref 0.0–0.9)
HIV Screen 4th Generation wRfx: NONREACTIVE
Hematocrit: 36.3 % (ref 34.0–46.6)
Hemoglobin: 11.8 g/dL (ref 11.1–15.9)
Hepatitis B Surface Ag: NEGATIVE
Immature Grans (Abs): 0 10*3/uL (ref 0.0–0.1)
Immature Granulocytes: 0 %
Lymphocytes Absolute: 1.4 10*3/uL (ref 0.7–3.1)
Lymphs: 19 %
MCH: 25.1 pg — ABNORMAL LOW (ref 26.6–33.0)
MCHC: 32.5 g/dL (ref 31.5–35.7)
MCV: 77 fL — ABNORMAL LOW (ref 79–97)
Monocytes Absolute: 0.4 10*3/uL (ref 0.1–0.9)
Monocytes: 5 %
Neutrophils Absolute: 5.6 10*3/uL (ref 1.4–7.0)
Neutrophils: 75 %
Platelets: 243 10*3/uL (ref 150–450)
RBC: 4.71 x10E6/uL (ref 3.77–5.28)
RDW: 14.7 % (ref 11.7–15.4)
RPR Ser Ql: NONREACTIVE
Rh Factor: POSITIVE
Rubella Antibodies, IGG: 1.02 index (ref >0.99)
WBC: 7.5 10*3/uL (ref 3.4–10.8)

## 2020-04-15 LAB — COMPREHENSIVE METABOLIC PANEL
ALT: 17 IU/L (ref 0–32)
AST: 14 IU/L (ref 0–40)
Albumin/Globulin Ratio: 1.5 (ref 1.2–2.2)
Albumin: 3.8 g/dL — ABNORMAL LOW (ref 3.9–5.0)
Alkaline Phosphatase: 63 IU/L (ref 48–121)
BUN/Creatinine Ratio: 15 (ref 9–23)
BUN: 7 mg/dL (ref 6–20)
Bilirubin Total: <0.2 mg/dL (ref 0.0–1.2)
CO2: 18 mmol/L — ABNORMAL LOW (ref 20–29)
Calcium: 9.4 mg/dL (ref 8.7–10.2)
Chloride: 105 mmol/L (ref 96–106)
Creatinine, Ser: 0.46 mg/dL — ABNORMAL LOW (ref 0.57–1.00)
GFR calc Af Amer: 154 mL/min/{1.73_m2} (ref >59)
GFR calc non Af Amer: 134 mL/min/{1.73_m2} (ref >59)
Globulin, Total: 2.5 g/dL (ref 1.5–4.5)
Glucose: 91 mg/dL (ref 65–99)
Potassium: 3.8 mmol/L (ref 3.5–5.2)
Sodium: 138 mmol/L (ref 134–144)
Total Protein: 6.3 g/dL (ref 6.0–8.5)

## 2020-04-15 LAB — CYTOLOGY - PAP
Chlamydia: NEGATIVE
Comment: NEGATIVE
Comment: NEGATIVE
Comment: NORMAL
Diagnosis: NEGATIVE
High risk HPV: NEGATIVE
Neisseria Gonorrhea: NEGATIVE

## 2020-04-15 LAB — HCV INTERPRETATION

## 2020-04-15 LAB — HEMOGLOBIN A1C
Est. average glucose Bld gHb Est-mCnc: 88 mg/dL
Hgb A1c MFr Bld: 4.7 % — ABNORMAL LOW (ref 4.8–5.6)

## 2020-04-16 LAB — CULTURE, OB URINE

## 2020-04-16 LAB — URINE CULTURE, OB REFLEX

## 2020-04-21 ENCOUNTER — Other Ambulatory Visit: Payer: Self-pay

## 2020-04-21 ENCOUNTER — Other Ambulatory Visit: Payer: Medicaid Other

## 2020-04-21 DIAGNOSIS — Z8632 Personal history of gestational diabetes: Secondary | ICD-10-CM

## 2020-04-21 DIAGNOSIS — O0990 Supervision of high risk pregnancy, unspecified, unspecified trimester: Secondary | ICD-10-CM

## 2020-04-22 LAB — GLUCOSE TOLERANCE, 2 HOURS W/ 1HR
Glucose, 1 hour: 166 mg/dL (ref 65–179)
Glucose, 2 hour: 134 mg/dL (ref 65–152)
Glucose, Fasting: 68 mg/dL (ref 65–91)

## 2020-05-07 ENCOUNTER — Telehealth (INDEPENDENT_AMBULATORY_CARE_PROVIDER_SITE_OTHER): Payer: Medicaid Other | Admitting: Lactation Services

## 2020-05-07 DIAGNOSIS — Z148 Genetic carrier of other disease: Secondary | ICD-10-CM

## 2020-05-07 DIAGNOSIS — O0990 Supervision of high risk pregnancy, unspecified, unspecified trimester: Secondary | ICD-10-CM

## 2020-05-07 NOTE — Telephone Encounter (Signed)
Called patient to give [employer] Carrier Status.   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 Informed patient she has the Gene and not the disease.   Reviewed calling [facility] at [phone] to set up a Genetic Counseling Session on the phone to discuss. Reviewed that it is recommended that FOB also be tested to see if he carries the same gene.   Patient voiced understanding.

## 2020-05-12 ENCOUNTER — Other Ambulatory Visit: Payer: Self-pay

## 2020-05-12 ENCOUNTER — Ambulatory Visit (INDEPENDENT_AMBULATORY_CARE_PROVIDER_SITE_OTHER): Payer: Medicaid Other | Admitting: Obstetrics and Gynecology

## 2020-05-12 VITALS — BP 114/73 | HR 99 | Wt 142.5 lb

## 2020-05-12 DIAGNOSIS — Z3A21 21 weeks gestation of pregnancy: Secondary | ICD-10-CM

## 2020-05-12 DIAGNOSIS — O0990 Supervision of high risk pregnancy, unspecified, unspecified trimester: Secondary | ICD-10-CM

## 2020-05-12 DIAGNOSIS — O0992 Supervision of high risk pregnancy, unspecified, second trimester: Secondary | ICD-10-CM

## 2020-05-12 DIAGNOSIS — Z8632 Personal history of gestational diabetes: Secondary | ICD-10-CM

## 2020-05-12 DIAGNOSIS — Z98891 History of uterine scar from previous surgery: Secondary | ICD-10-CM

## 2020-05-12 NOTE — Addendum Note (Signed)
Addended by: Kathee Delton on: 05/12/2020 09:56 AM   Modules accepted: Orders

## 2020-05-12 NOTE — Progress Notes (Signed)
PRENATAL VISIT NOTE  Subjective:  Maureen Cameron is a 29 y.o. G2P1001 at [redacted]w[redacted]d being seen today for ongoing prenatal care.  She is currently monitored for the following issues for this high-risk pregnancy and has Maternal varicella, non-immune; History of  gestational diabetes; Supervision of high risk pregnancy, antepartum; and History of cesarean section on their problem list.  Patient doing well with no acute concerns today. She reports no complaints.  Contractions: Not present. Vag. Bleeding: None.  Movement: Present. Denies leaking of fluid.   Pt desires TOLAC. She says she got to complete with last pregnancy and pushed several hours but was unable to deliver. 29 y.o. G2P1001 at [redacted]w[redacted]d with Estimated Date of Delivery: 09/17/20 was seen today in office to discuss trial of labor after cesarean section (TOLAC) versus elective repeat cesarean delivery (ERCD). The following risks were discussed with the patient.  Risk of uterine rupture at term is 0.78 percent with TOLAC and 0.22 percent with ERCD. 1 in 10 uterine ruptures will result in neonatal death or neurological injury. The benefits of a trial of labor after cesarean (TOLAC) resulting in a vaginal birth after cesarean (VBAC) include the following: shorter length of hospital stay and postpartum recovery (in most cases); fewer complications, such as postpartum fever, wound or uterine infection, thromboembolism (blood clots in the leg or lung), need for blood transfusion and fewer neonatal breathing problems. The risks of an attempted VBAC or TOLAC include the following: . Risk of failed trial of labor after cesarean (TOLAC) without a vaginal birth after cesarean (VBAC) resulting in repeat cesarean delivery (RCD) in about 20 to 40 percent of women who attempt VBAC.  Marland Kitchen Risk of rupture of uterus resulting in an emergency cesarean delivery. The risk of uterine rupture may be related in part to the type of uterine incision made during the first cesarean  delivery. A previous transverse uterine incision has the lowest risk of rupture (0.2 to 1.5 percent risk). Vertical or T-shaped uterine incisions have a higher risk of uterine rupture (4 to 9 percent risk)The risk of fetal death is very low with both VBAC and elective repeat cesarean delivery (ERCD), but the likelihood of fetal death is higher with VBAC than with ERCD. Maternal death is very rare with either type of delivery. The risks of an elective repeat cesarean delivery (ERCD) were reviewed with the patient including but not limited to: 11/998 risk of uterine rupture which could have serious consequences, bleeding which may require transfusion; infection which may require antibiotics; injury to bowel, bladder or other surrounding organs (bowel, bladder, ureters); injury to the fetus; need for additional procedures including hysterectomy in the event of a life-threatening hemorrhage; thromboembolic phenomenon; abnormal placentation; incisional problems; death and other postoperative or anesthesia complications.    These risks and benefits are summarized on the consent form, which was reviewed with the patient during the visit.  All her questions answered and she signed a consent indicating a preference for TOLAC/ERCD. A copy of the consent was given to the patient.      The following portions of the patient's history were reviewed and updated as appropriate: allergies, current medications, past family history, past medical history, past social history, past surgical history and problem list. Problem list updated.  Objective:   Vitals:   05/12/20 0906  BP: 114/73  Pulse: 99  Weight: 142 lb 8 oz (64.6 kg)    Fetal Status: Fetal Heart Rate (bpm): 144   Movement: Present     General:  Alert, oriented and cooperative. Patient is in no acute distress.  Skin: Skin is warm and dry. No rash noted.   Cardiovascular: Normal heart rate noted  Respiratory: Normal respiratory effort, no problems with  respiration noted  Abdomen: Soft, gravid, appropriate for gestational age.  Pain/Pressure: Present     Pelvic: Cervical exam deferred        Extremities: Normal range of motion.  Edema: None  Mental Status:  Normal mood and affect. Normal behavior. Normal judgment and thought content.   Assessment and Plan:  Pregnancy: G2P1001 at [redacted]w[redacted]d  1. Supervision of high risk pregnancy, antepartum Reviewed labs, no gestational diabetes noted at this time - AFP, Serum, Open Spina Bifida  2. History of  gestational diabetes A1c  WNL  3. History of cesarean section Currently pt desires TOLAC, pt was counseled She needs to sign VBAC papers at next visit  Preterm labor symptoms and general obstetric precautions including but not limited to vaginal bleeding, contractions, leaking of fluid and fetal movement were reviewed in detail with the patient.  Please refer to After Visit Summary for other counseling recommendations.   Return in about 4 weeks (around 06/09/2020) for ROB, in person.   Mariel Aloe, MD

## 2020-05-12 NOTE — Addendum Note (Signed)
Addended by: Faythe Casa on: 05/12/2020 09:41 AM   Modules accepted: Orders

## 2020-05-12 NOTE — Patient Instructions (Signed)

## 2020-05-14 ENCOUNTER — Other Ambulatory Visit: Payer: Self-pay

## 2020-05-14 ENCOUNTER — Ambulatory Visit: Payer: Medicaid Other | Admitting: *Deleted

## 2020-05-14 ENCOUNTER — Ambulatory Visit: Payer: Medicaid Other | Attending: Obstetrics and Gynecology

## 2020-05-14 DIAGNOSIS — Z3A22 22 weeks gestation of pregnancy: Secondary | ICD-10-CM

## 2020-05-14 DIAGNOSIS — Z98891 History of uterine scar from previous surgery: Secondary | ICD-10-CM | POA: Diagnosis present

## 2020-05-14 DIAGNOSIS — Z363 Encounter for antenatal screening for malformations: Secondary | ICD-10-CM

## 2020-05-14 DIAGNOSIS — O0992 Supervision of high risk pregnancy, unspecified, second trimester: Secondary | ICD-10-CM

## 2020-05-14 DIAGNOSIS — Z148 Genetic carrier of other disease: Secondary | ICD-10-CM | POA: Diagnosis not present

## 2020-05-14 DIAGNOSIS — O34219 Maternal care for unspecified type scar from previous cesarean delivery: Secondary | ICD-10-CM | POA: Diagnosis not present

## 2020-05-14 DIAGNOSIS — O0990 Supervision of high risk pregnancy, unspecified, unspecified trimester: Secondary | ICD-10-CM | POA: Insufficient documentation

## 2020-05-14 DIAGNOSIS — O09292 Supervision of pregnancy with other poor reproductive or obstetric history, second trimester: Secondary | ICD-10-CM | POA: Diagnosis not present

## 2020-05-24 ENCOUNTER — Encounter: Payer: Self-pay | Admitting: *Deleted

## 2020-06-03 ENCOUNTER — Encounter: Payer: Self-pay | Admitting: General Practice

## 2020-06-03 ENCOUNTER — Encounter: Payer: Self-pay | Admitting: Lactation Services

## 2020-06-04 ENCOUNTER — Encounter: Payer: Self-pay | Admitting: Family Medicine

## 2020-06-04 DIAGNOSIS — D563 Thalassemia minor: Secondary | ICD-10-CM | POA: Insufficient documentation

## 2020-06-09 ENCOUNTER — Ambulatory Visit (INDEPENDENT_AMBULATORY_CARE_PROVIDER_SITE_OTHER): Payer: Medicaid Other | Admitting: Family Medicine

## 2020-06-09 ENCOUNTER — Other Ambulatory Visit: Payer: Self-pay

## 2020-06-09 VITALS — BP 114/72 | HR 97 | Wt 149.0 lb

## 2020-06-09 DIAGNOSIS — O0990 Supervision of high risk pregnancy, unspecified, unspecified trimester: Secondary | ICD-10-CM

## 2020-06-09 DIAGNOSIS — Z8632 Personal history of gestational diabetes: Secondary | ICD-10-CM

## 2020-06-09 DIAGNOSIS — Z98891 History of uterine scar from previous surgery: Secondary | ICD-10-CM

## 2020-06-09 NOTE — Patient Instructions (Signed)

## 2020-06-09 NOTE — Progress Notes (Signed)
   PRENATAL VISIT NOTE  Subjective:  Maureen Cameron is a 29 y.o. G2P1001 at [redacted]w[redacted]d being seen today for ongoing prenatal care.  She is currently monitored for the following issues for this high-risk pregnancy and has Maternal varicella, non-immune; History of  gestational diabetes; Supervision of high risk pregnancy, antepartum; History of cesarean section; and Alpha thalassemia silent carrier on their problem list.  Patient reports backache.  Contractions: Not present. Vag. Bleeding: None.  Movement: Present. Denies leaking of fluid.   Patient s/p low-transverse cesarean section (11/18/2015) and desires TOLAC for this pregnancy. Was extensively counseled on benefits and risks of TOLAC by Dr. Donavan Foil during her last prenatal care visit.  The following portions of the patient's history were reviewed and updated as appropriate: allergies, current medications, past family history, past medical history, past social history, past surgical history and problem list.   Objective:   Vitals:   06/09/20 1003  BP: 114/72  Pulse: 97  Weight: 149 lb (67.6 kg)    Fetal Status: Fetal Heart Rate (bpm): 154   Movement: Present     General:  Alert, oriented and cooperative. Patient is in no acute distress.  Skin: Skin is warm and dry. No rash noted.   Cardiovascular: Normal heart rate noted  Respiratory: Normal respiratory effort, no problems with respiration noted  Abdomen: Soft, gravid, appropriate for gestational age.  Pain/Pressure: Present     Pelvic: Cervical exam deferred        Extremities: Normal range of motion.  Edema: None  Mental Status: Normal mood and affect. Normal behavior. Normal judgment and thought content.   Assessment and Plan:  Pregnancy: G2P1001 at [redacted]w[redacted]d 1. Supervision of high risk pregnancy, antepartum  - Doing well, no VB, LOF, or contractions. +FM. Anatomy US normal. Will schedule appointment in 3 weeks for 28 week labs, including GTT, CBC, RPR, HIV, and TdAP.  2. History of   gestational diabetes - First trimester A1c 4.7 and normal GTT. No indications for gestational diabetes at this time. Will repeat GTT in 3 weeks at 28w GA prenatal care visit.  3. History of cesarean section - S/p LTCS (11/18/2015). Desires TOLAC for this pregnancy. Adequately counseled on benefits and risks of TOLAC. VBAC consent form signed.  Preterm labor symptoms and general obstetric precautions including but not limited to vaginal bleeding, contractions, leaking of fluid and fetal movement were reviewed in detail with the patient. Please refer to After Visit Summary for other counseling recommendations.   Return in 3 weeks (on 06/30/2020) for in person, 28 wk labs, HRC.  Future Appointments  Date Time Provider Department Center  07/01/2020  8:50 AM [facility]  07/01/2020  9:15 AM Constant, Gigi Gin, MD [facility]    Eliezer Lofts, Medical Student  06/09/20 10:54 AM

## 2020-06-15 ENCOUNTER — Encounter: Payer: Self-pay | Admitting: *Deleted

## 2020-06-18 ENCOUNTER — Encounter: Payer: Self-pay | Admitting: General Practice

## 2020-07-01 ENCOUNTER — Other Ambulatory Visit: Payer: Medicaid Other

## 2020-07-01 ENCOUNTER — Ambulatory Visit (INDEPENDENT_AMBULATORY_CARE_PROVIDER_SITE_OTHER): Payer: Medicaid Other | Admitting: Obstetrics and Gynecology

## 2020-07-01 ENCOUNTER — Other Ambulatory Visit: Payer: Self-pay

## 2020-07-01 ENCOUNTER — Encounter: Payer: Self-pay | Admitting: Obstetrics and Gynecology

## 2020-07-01 VITALS — BP 116/75 | HR 91 | Wt 151.7 lb

## 2020-07-01 DIAGNOSIS — Z23 Encounter for immunization: Secondary | ICD-10-CM | POA: Diagnosis not present

## 2020-07-01 DIAGNOSIS — Z8632 Personal history of gestational diabetes: Secondary | ICD-10-CM

## 2020-07-01 DIAGNOSIS — O0990 Supervision of high risk pregnancy, unspecified, unspecified trimester: Secondary | ICD-10-CM | POA: Diagnosis not present

## 2020-07-01 DIAGNOSIS — O09899 Supervision of other high risk pregnancies, unspecified trimester: Secondary | ICD-10-CM

## 2020-07-01 DIAGNOSIS — Z283 Underimmunization status: Secondary | ICD-10-CM

## 2020-07-01 DIAGNOSIS — O24410 Gestational diabetes mellitus in pregnancy, diet controlled: Secondary | ICD-10-CM

## 2020-07-01 DIAGNOSIS — Z2839 Other underimmunization status: Secondary | ICD-10-CM

## 2020-07-01 DIAGNOSIS — Z98891 History of uterine scar from previous surgery: Secondary | ICD-10-CM

## 2020-07-01 NOTE — Progress Notes (Signed)
tdap vaccine given Flu vaccine given 28 wk packet provided

## 2020-07-01 NOTE — Progress Notes (Signed)
   PRENATAL VISIT NOTE  Subjective:  Maureen Cameron is a 29 y.o. G2P1001 at [redacted]w[redacted]d being seen today for ongoing prenatal care.  She is currently monitored for the following issues for this high-risk pregnancy and has Maternal varicella, non-immune; History of  gestational diabetes; Supervision of high risk pregnancy, antepartum; History of cesarean section; and Alpha thalassemia silent carrier on their problem list.  Patient reports no complaints.  Contractions: Not present. Vag. Bleeding: None.  Movement: Present. Denies leaking of fluid.   The following portions of the patient's history were reviewed and updated as appropriate: allergies, current medications, past family history, past medical history, past social history, past surgical history and problem list.   Objective:   Vitals:   07/01/20 0904  BP: 116/75  Pulse: 91  Weight: 151 lb 11.2 oz (68.8 kg)    Fetal Status: Fetal Heart Rate (bpm): 152 Fundal Height: 29 cm Movement: Present     General:  Alert, oriented and cooperative. Patient is in no acute distress.  Skin: Skin is warm and dry. No rash noted.   Cardiovascular: Normal heart rate noted  Respiratory: Normal respiratory effort, no problems with respiration noted  Abdomen: Soft, gravid, appropriate for gestational age.  Pain/Pressure: Present     Pelvic: Cervical exam deferred        Extremities: Normal range of motion.  Edema: None  Mental Status: Normal mood and affect. Normal behavior. Normal judgment and thought content.   Assessment and Plan:  Pregnancy: G2P1001 at [redacted]w[redacted]d 1. Supervision of high risk pregnancy, antepartum Patient is doing well without complaints Third trimester labs today with glucola Patient does not plan to use contraception  2. History of  gestational diabetes Testing today  3. History of cesarean section Patient desires TOLAC  4. Maternal varicella, non-immune Will offer pp  Preterm labor symptoms and general obstetric precautions  including but not limited to vaginal bleeding, contractions, leaking of fluid and fetal movement were reviewed in detail with the patient. Please refer to After Visit Summary for other counseling recommendations.   Return in about 2 weeks (around 07/15/2020) for in person, ROB, High risk.  No future appointments.  Catalina Antigua, MD

## 2020-07-02 ENCOUNTER — Telehealth (INDEPENDENT_AMBULATORY_CARE_PROVIDER_SITE_OTHER): Payer: Medicaid Other | Admitting: Lactation Services

## 2020-07-02 DIAGNOSIS — Z8632 Personal history of gestational diabetes: Secondary | ICD-10-CM | POA: Insufficient documentation

## 2020-07-02 DIAGNOSIS — O24415 Gestational diabetes mellitus in pregnancy, controlled by oral hypoglycemic drugs: Secondary | ICD-10-CM | POA: Insufficient documentation

## 2020-07-02 DIAGNOSIS — O24419 Gestational diabetes mellitus in pregnancy, unspecified control: Secondary | ICD-10-CM | POA: Insufficient documentation

## 2020-07-02 DIAGNOSIS — O24410 Gestational diabetes mellitus in pregnancy, diet controlled: Secondary | ICD-10-CM

## 2020-07-02 LAB — CBC
Hematocrit: 33.7 % — ABNORMAL LOW (ref 34.0–46.6)
Hemoglobin: 11.6 g/dL (ref 11.1–15.9)
MCH: 26.9 pg (ref 26.6–33.0)
MCHC: 34.4 g/dL (ref 31.5–35.7)
MCV: 78 fL — ABNORMAL LOW (ref 79–97)
Platelets: 220 10*3/uL (ref 150–450)
RBC: 4.32 x10E6/uL (ref 3.77–5.28)
RDW: 13.4 % (ref 11.7–15.4)
WBC: 8.3 10*3/uL (ref 3.4–10.8)

## 2020-07-02 LAB — HIV ANTIBODY (ROUTINE TESTING W REFLEX): HIV Screen 4th Generation wRfx: NONREACTIVE

## 2020-07-02 LAB — GLUCOSE TOLERANCE, 2 HOURS W/ 1HR
Glucose, 1 hour: 206 mg/dL — ABNORMAL HIGH (ref 65–179)
Glucose, 2 hour: 181 mg/dL — ABNORMAL HIGH (ref 65–152)
Glucose, Fasting: 74 mg/dL (ref 65–91)

## 2020-07-02 LAB — RPR: RPR Ser Ql: NONREACTIVE

## 2020-07-02 MED ORDER — ACCU-CHEK SOFTCLIX LANCETS MISC: 12 refills | Status: DC

## 2020-07-02 MED ORDER — ACCU-CHEK GUIDE W/DEVICE KIT: 1.0000 | PACK | Freq: Once | 0 refills | Status: AC

## 2020-07-02 MED ORDER — ACCU-CHEK GUIDE VI STRP: ORAL_STRIP | 12 refills | Status: DC

## 2020-07-02 NOTE — Telephone Encounter (Addendum)
 -----   Message from Catalina Antigua, MD sent at 07/02/2020 10:31 AM EDT ----- Please inform patient of failed glucola and diagnosis of diabetes in pregnancy. She will be referred to diabetic educator. If she can modify her eating habits to a garb modified diet it would be great (she had GDM in a previous pregnancy).    Called patient and informed her of results. She reports she saw on My Chart.   Diabetes Supplies ordered for patient and she was informed.   Patient informed that she will be called by the front office to set up a Diabetes Education.   Patient reports she will not be using [employer], informed she would then need to write blood sugars on a paper log.

## 2020-07-13 ENCOUNTER — Encounter: Payer: Medicaid Other | Attending: Obstetrics and Gynecology | Admitting: Registered"

## 2020-07-13 ENCOUNTER — Other Ambulatory Visit: Payer: Self-pay

## 2020-07-13 ENCOUNTER — Ambulatory Visit: Payer: Medicaid Other | Admitting: Registered"

## 2020-07-13 DIAGNOSIS — Z3A00 Weeks of gestation of pregnancy not specified: Secondary | ICD-10-CM | POA: Diagnosis not present

## 2020-07-13 DIAGNOSIS — O24410 Gestational diabetes mellitus in pregnancy, diet controlled: Secondary | ICD-10-CM | POA: Diagnosis not present

## 2020-07-13 NOTE — Progress Notes (Signed)
Patient was seen on 07/13/20 for Gestational Diabetes self-management. EDD 09/17/20. Patient states prior history of GDM. Diet history obtained. Patient eats variety of all food groups. Beverages include water, orange juice, unsweet tea with milk.  Patient reports 60 min/day structured physical activity.  The following learning objectives were met by the patient :   States the definition of Gestational Diabetes  States why dietary management is important in controlling blood glucose  Describes the effects of carbohydrates on blood glucose levels  Demonstrates ability to create a balanced meal plan  Demonstrates carbohydrate counting   States when to check blood glucose levels  Demonstrates proper blood glucose monitoring techniques  States the effect of stress and exercise on blood glucose levels  States the importance of limiting caffeine and abstaining from alcohol and smoking  Plan:  Aim for 3 Carbohydrate Choices per meal (45 grams) +/- 1 either way  Aim for 1-2 Carbohydrate Choices per snack Begin reading food labels for Total Carbohydrate of foods If OK with your MD, consider  increasing your activity level by walking, Arm Chair Exercises or other activity daily as tolerated Begin checking Blood Glucose before breakfast and 2 hours after first bite of breakfast, lunch and dinner as directed by MD  Bring Log Book/Sheet and meter to every medical appointment  Baby Scripts: Patient was introduced to [employer] and  plans to use as record of blood glucose electronically  Take medication if directed by MD  Patient already has a meter, is testing pre breakfast and 2 hours after each meal. Review of Log Book shows: FBS 80% WNL; Post prandial 20% WNL  Patient instructed to monitor glucose levels: FBS: 60 - 95 mg/dl 2 hour: <120 mg/dl  Patient received the following handouts:  Nutrition Diabetes and Pregnancy  Carbohydrate Counting List  Blood glucose Log Sheet  Patient  will be seen for follow-up as needed.

## 2020-07-14 ENCOUNTER — Other Ambulatory Visit: Payer: Self-pay

## 2020-07-16 ENCOUNTER — Other Ambulatory Visit: Payer: Self-pay

## 2020-07-16 ENCOUNTER — Ambulatory Visit (INDEPENDENT_AMBULATORY_CARE_PROVIDER_SITE_OTHER): Payer: Medicaid Other | Admitting: Obstetrics & Gynecology

## 2020-07-16 VITALS — BP 106/74 | HR 109 | Wt 155.0 lb

## 2020-07-16 DIAGNOSIS — O24410 Gestational diabetes mellitus in pregnancy, diet controlled: Secondary | ICD-10-CM

## 2020-07-16 DIAGNOSIS — Z3A31 31 weeks gestation of pregnancy: Secondary | ICD-10-CM

## 2020-07-16 DIAGNOSIS — O0990 Supervision of high risk pregnancy, unspecified, unspecified trimester: Secondary | ICD-10-CM

## 2020-07-16 DIAGNOSIS — Z98891 History of uterine scar from previous surgery: Secondary | ICD-10-CM

## 2020-07-16 NOTE — Progress Notes (Signed)
   PRENATAL VISIT NOTE  Subjective:  Maureen Cameron is a 29 y.o. G2P1001 at [redacted]w[redacted]d being seen today for ongoing prenatal care.  She is currently monitored for the following issues for this high-risk pregnancy and has Maternal varicella, non-immune; Supervision of high risk pregnancy, antepartum; History of cesarean section; Alpha thalassemia silent carrier; and Diet controlled gestational diabetes mellitus (GDM) in third trimester on their problem list.  Patient reports no complaints.  Contractions: Irritability. Vag. Bleeding: None.  Movement: Present. Denies leaking of fluid.   The following portions of the patient's history were reviewed and updated as appropriate: allergies, current medications, past family history, past medical history, past social history, past surgical history and problem list.   Objective:   Vitals:   07/16/20 0958  BP: 106/74  Pulse: (!) 109  Weight: 155 lb (70.3 kg)    Fetal Status: Fetal Heart Rate (bpm): 146   Movement: Present     General:  Alert, oriented and cooperative. Patient is in no acute distress.  Skin: Skin is warm and dry. No rash noted.   Cardiovascular: Normal heart rate noted  Respiratory: Normal respiratory effort, no problems with respiration noted  Abdomen: Soft, gravid, appropriate for gestational age.  Pain/Pressure: Present     Pelvic: Cervical exam deferred        Extremities: Normal range of motion.  Edema: None  Mental Status: Normal mood and affect. Normal behavior. Normal judgment and thought content.   Assessment and Plan:  Pregnancy: G2P1001 at [redacted]w[redacted]d 1. Diet controlled gestational diabetes mellitus (GDM) in third trimester Elevated dinner PP, eats good diet and walks after dinner. Recommended medication, she wants to see if she can get under control for one more week. Understands that once medication is styarted, will need weekly antenatal testing.   2. History of cesarean section TOLAC consent already signed.  3. [redacted] weeks  gestation of pregnancy 4. Supervision of high risk pregnancy, antepartum Preterm labor symptoms and general obstetric precautions including but not limited to vaginal bleeding, contractions, leaking of fluid and fetal movement were reviewed in detail with the patient. Please refer to After Visit Summary for other counseling recommendations.   Return in about 1 week (around 07/23/2020) for Virtual OB Visit/follow up BS.  Future Appointments  Date Time Provider Department Center  07/30/2020  9:55 AM [place] Bing, MD [facility]  08/20/2020  9:55 AM [place] Bing, MD [facility]  08/27/2020  9:55 AM [place] Bing, MD [facility]  09/03/2020  9:55 AM Hermina Staggers, MD [facility]  09/08/2020  9:55 AM [place] Bing, MD [facility]    Jaynie Collins, MD

## 2020-07-16 NOTE — Patient Instructions (Signed)
Return to office for any scheduled appointments. Call the office or go to the MAU at [facility] if:  You begin to have strong, frequent contractions  Your water breaks.  Sometimes it is a big gush of fluid, sometimes it is just a trickle that keeps getting your panties wet or running down your legs  You have vaginal bleeding.  It is normal to have a small amount of spotting if your cervix was checked.   You do not feel your baby moving like normal.  If you do not, get something to eat and drink and lay down and focus on feeling your baby move.   If your baby is still not moving like normal, you should call the office or go to MAU.  Any other obstetric concerns.   

## 2020-07-30 ENCOUNTER — Other Ambulatory Visit: Payer: Self-pay

## 2020-07-30 ENCOUNTER — Ambulatory Visit (INDEPENDENT_AMBULATORY_CARE_PROVIDER_SITE_OTHER): Payer: Medicaid Other | Admitting: Obstetrics and Gynecology

## 2020-07-30 ENCOUNTER — Ambulatory Visit: Payer: Self-pay

## 2020-07-30 VITALS — BP 106/67 | HR 96 | Wt 159.2 lb

## 2020-07-30 DIAGNOSIS — Z3A33 33 weeks gestation of pregnancy: Secondary | ICD-10-CM

## 2020-07-30 DIAGNOSIS — O24419 Gestational diabetes mellitus in pregnancy, unspecified control: Secondary | ICD-10-CM | POA: Diagnosis not present

## 2020-07-30 DIAGNOSIS — Z98891 History of uterine scar from previous surgery: Secondary | ICD-10-CM

## 2020-07-30 DIAGNOSIS — O0990 Supervision of high risk pregnancy, unspecified, unspecified trimester: Secondary | ICD-10-CM

## 2020-07-30 LAB — POCT URINALYSIS DIP (DEVICE)
Bilirubin Urine: NEGATIVE
Glucose, UA: NEGATIVE mg/dL
Ketones, ur: NEGATIVE mg/dL
Nitrite: NEGATIVE
Protein, ur: NEGATIVE mg/dL
Specific Gravity, Urine: 1.02 (ref 1.005–1.030)
Urobilinogen, UA: 0.2 mg/dL (ref 0.0–1.0)
pH: 6.5 (ref 5.0–8.0)

## 2020-07-30 MED ORDER — METFORMIN HCL 500 MG PO TABS: 500.0000 mg | ORAL_TABLET | Freq: Every day | ORAL | 1 refills | Status: DC

## 2020-07-30 NOTE — Progress Notes (Signed)
      Friday, Oct 15 dinner is an incorrect entry.   Fleet Contras RN 07/30/20

## 2020-07-30 NOTE — Progress Notes (Signed)
Prenatal Visit Note Date: 07/30/2020 Clinic: [facility]  Subjective:  Maureen Cameron is a 29 y.o. G2P1001 at [redacted]w[redacted]d being seen today for ongoing prenatal care.  She is currently monitored for the following issues for this high-risk pregnancy and has Maternal varicella, non-immune; Supervision of high risk pregnancy, antepartum; History of cesarean section; Alpha thalassemia silent carrier; and Diet controlled gestational diabetes mellitus (GDM) in third trimester on their problem list.  Patient reports no complaints.   Contractions: Irritability. Vag. Bleeding: None.  Movement: Present. Denies leaking of fluid.   The following portions of the patient's history were reviewed and updated as appropriate: allergies, current medications, past family history, past medical history, past social history, past surgical history and problem list. Problem list updated.  Objective:   Vitals:   07/30/20 1039  BP: 106/67  Pulse: 96  Weight: 159 lb 3.2 oz (72.2 kg)    Fetal Status: Fetal Heart Rate (bpm): 142   Movement: Present     General:  Alert, oriented and cooperative. Patient is in no acute distress.  Skin: Skin is warm and dry. No rash noted.   Cardiovascular: Normal heart rate noted  Respiratory: Normal respiratory effort, no problems with respiration noted  Abdomen: Soft, gravid, appropriate for gestational age. Pain/Pressure: Present     Pelvic:  Cervical exam deferred        Extremities: Normal range of motion.  Edema: None  Mental Status: Normal mood and affect. Normal behavior. Normal judgment and thought content.   Urinalysis:      Assessment and Plan:  Pregnancy: G2P1001 at [redacted]w[redacted]d  1. GDM, class A2 Still with slightly elevated 2h PP dinner number despite diet and exercise. Pt on glybuide in first pregnancy. Will start metformin 500 with dinner. D/w her re: weekly testing. BPP 10/10, cephalic today - [place] MFM OB FOLLOW UP; Future - [place] MFM FETAL BPP WO NON  STRESS; Future  2. H/o c-section Desires [place]. Re discuss with patient after growth u/s  Preterm labor symptoms and general obstetric precautions including but not limited to vaginal bleeding, contractions, leaking of fluid and fetal movement were reviewed in detail with the patient. Please refer to After Visit Summary for other counseling recommendations.  Return in about 1 week (around 08/06/2020) for in person, high risk, nst/bpp with diane.   Colby Bing, MD

## 2020-08-02 ENCOUNTER — Other Ambulatory Visit: Payer: Self-pay | Admitting: Lactation Services

## 2020-08-02 MED ORDER — METFORMIN HCL 500 MG PO TABS
500.0000 mg | ORAL_TABLET | Freq: Every day | ORAL | 1 refills | Status: DC
Start: 2020-08-02 — End: 2020-09-13

## 2020-08-02 NOTE — Progress Notes (Signed)
Reordered Metformin as did not transmit to pharmacy on original order.

## 2020-08-12 ENCOUNTER — Ambulatory Visit (INDEPENDENT_AMBULATORY_CARE_PROVIDER_SITE_OTHER): Payer: Medicaid Other | Admitting: General Practice

## 2020-08-12 ENCOUNTER — Ambulatory Visit (INDEPENDENT_AMBULATORY_CARE_PROVIDER_SITE_OTHER): Payer: Medicaid Other | Admitting: Obstetrics & Gynecology

## 2020-08-12 ENCOUNTER — Other Ambulatory Visit: Payer: Self-pay

## 2020-08-12 VITALS — BP 119/67 | HR 97 | Wt 161.0 lb

## 2020-08-12 DIAGNOSIS — O24410 Gestational diabetes mellitus in pregnancy, diet controlled: Secondary | ICD-10-CM

## 2020-08-12 DIAGNOSIS — O0990 Supervision of high risk pregnancy, unspecified, unspecified trimester: Secondary | ICD-10-CM

## 2020-08-12 NOTE — Progress Notes (Signed)
History:  [name] is a 29 y.o. G2P1001 109w6d who presents to clinic today for ongoing prenatal care.  She is currently monitored for the following issues for this high-risk pregnancy and has Maternal varicella, non-immune; Supervision of high risk pregnancy, antepartum; History of cesarean section; Alpha thalassemia silent carrier; and Diet controlled gestational diabetes mellitus (GDM) in third trimester on their problem list.   Patient reports mild dysuria, but denies syncope, shortness of breath, RUQ pain.  Contractions: none, Vag bleeding: None. Movement: presents. Denies leaking of fluid of vaginal discharged.    The following portions of the patient's history were reviewed and updated as appropriate: allergies, current medications, family history, past medical history, social history, past surgical history and problem list.  Review of Systems:  Review of Systems  Constitutional: Negative.   HENT: Negative.   Eyes: Negative.   Respiratory: Negative.   Cardiovascular: Negative.   Gastrointestinal: Negative.   Genitourinary: Negative.   Musculoskeletal: Negative.   Skin: Negative.   Neurological: Negative.   Endo/Heme/Allergies: Negative.   Psychiatric/Behavioral: Negative.       Objective:  Physical Exam BP 119/67   Pulse 97   Wt 161 lb (73 kg)   LMP 12/12/2019   BMI 27.64 kg/m  Physical Exam Vitals and nursing note reviewed. Exam conducted with a chaperone present.  Constitutional:      Appearance: Normal appearance.  HENT:     Mouth/Throat:     Mouth: Mucous membranes are moist.  Cardiovascular:     Rate and Rhythm: Normal rate and regular rhythm.  Pulmonary:     Effort: Pulmonary effort is normal.     Breath sounds: Normal breath sounds.  Abdominal:     Palpations: Abdomen is soft.  Musculoskeletal:     Cervical back: Normal range of motion and neck supple.  Skin:    General: Skin is warm.  Neurological:     General: No focal deficit present.      Mental Status: She is alert and oriented to person, place, and time.  Psychiatric:        Mood and Affect: Mood normal.        Behavior: Behavior normal.        Thought Content: Thought content normal.      Labs and Imaging No results found for this or any previous visit (from the past 24 hour(s)).  No results found.   Assessment & Plan:  Pregnancy: G2P1001 at [redacted]w[redacted]d  1. Supervision of high risk pregnancy, antepartum -Patient has additional BPP and growth u/s scheduled for tomorrow.  - Continue weekly monitoring   2. Diet controlled gestational diabetes mellitus (GDM) in third trimester - Dinner glucose is mildly elevated. Continue to monitor and encouraged diet management.  - On Metformin 500 mg daily. Increase to BID if the glucose is not controlled     3. H/o c-section - Desires [place]. We will revisit the discussion after growth u/s which is scheduled for tomorrow.    Preterm labor symptoms and general obstetric precautions including but not limited to vaginal bleeding, contractions, leaking of fluid and fetal movement were reviewed in detail with the patient. Please refer to After Visit Summary for other counseling recommendations.  Return in about 1 week (around 08/20/20) for in person.     Malen Gauze, Medical Student 08/12/2020 12:02 PM

## 2020-08-13 ENCOUNTER — Ambulatory Visit: Payer: Medicaid Other | Attending: Obstetrics and Gynecology

## 2020-08-13 ENCOUNTER — Ambulatory Visit: Payer: Medicaid Other | Admitting: *Deleted

## 2020-08-13 ENCOUNTER — Encounter: Payer: Self-pay | Admitting: *Deleted

## 2020-08-13 DIAGNOSIS — O0990 Supervision of high risk pregnancy, unspecified, unspecified trimester: Secondary | ICD-10-CM | POA: Insufficient documentation

## 2020-08-13 DIAGNOSIS — O24415 Gestational diabetes mellitus in pregnancy, controlled by oral hypoglycemic drugs: Secondary | ICD-10-CM

## 2020-08-13 DIAGNOSIS — O24419 Gestational diabetes mellitus in pregnancy, unspecified control: Secondary | ICD-10-CM | POA: Diagnosis not present

## 2020-08-13 DIAGNOSIS — Z3A35 35 weeks gestation of pregnancy: Secondary | ICD-10-CM | POA: Diagnosis not present

## 2020-08-13 DIAGNOSIS — O09293 Supervision of pregnancy with other poor reproductive or obstetric history, third trimester: Secondary | ICD-10-CM | POA: Diagnosis not present

## 2020-08-13 DIAGNOSIS — O34219 Maternal care for unspecified type scar from previous cesarean delivery: Secondary | ICD-10-CM

## 2020-08-13 DIAGNOSIS — Z7984 Long term (current) use of oral hypoglycemic drugs: Secondary | ICD-10-CM

## 2020-08-13 DIAGNOSIS — Z148 Genetic carrier of other disease: Secondary | ICD-10-CM

## 2020-08-15 ENCOUNTER — Encounter: Payer: Self-pay | Admitting: Obstetrics and Gynecology

## 2020-08-15 DIAGNOSIS — O3660X0 Maternal care for excessive fetal growth, unspecified trimester, not applicable or unspecified: Secondary | ICD-10-CM | POA: Insufficient documentation

## 2020-08-19 ENCOUNTER — Other Ambulatory Visit: Payer: Self-pay

## 2020-08-19 ENCOUNTER — Ambulatory Visit: Payer: Self-pay

## 2020-08-19 ENCOUNTER — Ambulatory Visit (INDEPENDENT_AMBULATORY_CARE_PROVIDER_SITE_OTHER): Payer: Medicaid Other | Admitting: General Practice

## 2020-08-19 ENCOUNTER — Ambulatory Visit (INDEPENDENT_AMBULATORY_CARE_PROVIDER_SITE_OTHER): Payer: Medicaid Other | Admitting: Obstetrics and Gynecology

## 2020-08-19 VITALS — BP 110/72 | HR 99 | Wt 161.0 lb

## 2020-08-19 DIAGNOSIS — O3663X0 Maternal care for excessive fetal growth, third trimester, not applicable or unspecified: Secondary | ICD-10-CM

## 2020-08-19 DIAGNOSIS — O24410 Gestational diabetes mellitus in pregnancy, diet controlled: Secondary | ICD-10-CM

## 2020-08-19 DIAGNOSIS — O0990 Supervision of high risk pregnancy, unspecified, unspecified trimester: Secondary | ICD-10-CM

## 2020-08-19 DIAGNOSIS — O24419 Gestational diabetes mellitus in pregnancy, unspecified control: Secondary | ICD-10-CM

## 2020-08-19 DIAGNOSIS — Z98891 History of uterine scar from previous surgery: Secondary | ICD-10-CM

## 2020-08-19 DIAGNOSIS — Z3A35 35 weeks gestation of pregnancy: Secondary | ICD-10-CM

## 2020-08-19 NOTE — Progress Notes (Signed)
Pt informed that the ultrasound is considered a limited OB ultrasound and is not intended to be a complete ultrasound exam.  Patient also informed that the ultrasound is not being completed with the intent of assessing for fetal or placental anomalies or any pelvic abnormalities.  Explained that the purpose of today's ultrasound is to assess for  BPP, presentation and AFI.  Patient acknowledges the purpose of the exam and the limitations of the study.    Chuck Caban H RN BSN 08/19/20  

## 2020-08-19 NOTE — Progress Notes (Signed)
  Prenatal Visit Note Date: 08/19/2020 Clinic: [facility]  Subjective:  Maureen Cameron is a 29 y.o. G2P1001 at [redacted]w[redacted]d being seen today for ongoing prenatal care.  She is currently monitored for the following issues for this high-risk pregnancy and has Maternal varicella, non-immune; Supervision of high risk pregnancy, antepartum; History of cesarean section; Alpha thalassemia silent carrier; Diet controlled gestational diabetes mellitus (GDM) in third trimester; and LGA (large for gestational age) fetus affecting management of mother on their problem list.  Patient reports no complaints.   Contractions: Irregular. Vag. Bleeding: None.  Movement: Present. Denies leaking of fluid.   The following portions of the patient's history were reviewed and updated as appropriate: allergies, current medications, past family history, past medical history, past social history, past surgical history and problem list. Problem list updated.  Objective:  There were no vitals filed for this visit.  110/72 99 161 lbs  Fetal Status:     Movement: Present  Presentation: Vertex  General:  Alert, oriented and cooperative. Patient is in no acute distress.  Skin: Skin is warm and dry. No rash noted.   Cardiovascular: Normal heart rate noted  Respiratory: Normal respiratory effort, no problems with respiration noted  Abdomen: Soft, gravid, appropriate for gestational age. Pain/Pressure: Present     Pelvic:  Cervical exam deferred        Extremities: Normal range of motion.     Mental Status: Normal mood and affect. Normal behavior. Normal judgment and thought content.   Urinalysis:      Assessment and Plan:  Pregnancy: G2P1001 at [redacted]w[redacted]d  1. Supervision of high risk pregnancy, antepartum Routine care. GBS nv  2. History of cesarean section 2017 pLTCS, 3500gm, arrest of descent, chorio. 37wks, gdm a2 (glyburide). Pushed for 3.5h. baby feels bigger. Pt desires rpt c/s. In basket sent for  39wk delivery  3. Excessive fetal growth affecting management of pregnancy in third trimester, single or unspecified fetus  4. GDMA2 bpp 10/10. Normal CBG log except 120s-130s 2h PP dinner. Pt takes metformin 500 with dinner. I told her they aren't bad and if she feels she is doing everything diet wise to help with her CBG then I'd go up to 1000 with dinner. If she feels like she can better control her diet and do a post prandial walk, I told her to leave it at  Preterm labor symptoms and general obstetric precautions including but not limited to vaginal bleeding, contractions, leaking of fluid and fetal movement were reviewed in detail with the patient. Please refer to After Visit Summary for other counseling recommendations.  Return in about 1 week (around 08/26/2020) for nst/bpp with diane and hrob in person with md.   [place] Bing, MD

## 2020-08-20 ENCOUNTER — Encounter: Payer: Medicaid Other | Admitting: Obstetrics and Gynecology

## 2020-08-23 ENCOUNTER — Other Ambulatory Visit: Payer: Self-pay | Admitting: Obstetrics & Gynecology

## 2020-08-23 DIAGNOSIS — O24419 Gestational diabetes mellitus in pregnancy, unspecified control: Secondary | ICD-10-CM

## 2020-08-26 ENCOUNTER — Encounter: Payer: Self-pay | Admitting: Obstetrics and Gynecology

## 2020-08-26 ENCOUNTER — Other Ambulatory Visit: Payer: Self-pay

## 2020-08-26 ENCOUNTER — Ambulatory Visit (INDEPENDENT_AMBULATORY_CARE_PROVIDER_SITE_OTHER): Payer: Medicaid Other | Admitting: Obstetrics and Gynecology

## 2020-08-26 ENCOUNTER — Ambulatory Visit: Payer: Self-pay

## 2020-08-26 ENCOUNTER — Ambulatory Visit (INDEPENDENT_AMBULATORY_CARE_PROVIDER_SITE_OTHER): Payer: Medicaid Other | Admitting: *Deleted

## 2020-08-26 ENCOUNTER — Other Ambulatory Visit (HOSPITAL_COMMUNITY)
Admission: RE | Admit: 2020-08-26 | Discharge: 2020-08-26 | Disposition: A | Discharge status: Home / Self Care | Payer: Medicaid Other | Source: Ambulatory Visit | Attending: Obstetrics and Gynecology | Admitting: Obstetrics and Gynecology

## 2020-08-26 VITALS — BP 125/79 | HR 104 | Wt 165.5 lb

## 2020-08-26 DIAGNOSIS — O26893 Other specified pregnancy related conditions, third trimester: Secondary | ICD-10-CM | POA: Diagnosis present

## 2020-08-26 DIAGNOSIS — O24419 Gestational diabetes mellitus in pregnancy, unspecified control: Secondary | ICD-10-CM

## 2020-08-26 DIAGNOSIS — O3663X0 Maternal care for excessive fetal growth, third trimester, not applicable or unspecified: Secondary | ICD-10-CM

## 2020-08-26 DIAGNOSIS — O24415 Gestational diabetes mellitus in pregnancy, controlled by oral hypoglycemic drugs: Secondary | ICD-10-CM

## 2020-08-26 DIAGNOSIS — Z98891 History of uterine scar from previous surgery: Secondary | ICD-10-CM

## 2020-08-26 DIAGNOSIS — N898 Other specified noninflammatory disorders of vagina: Secondary | ICD-10-CM | POA: Insufficient documentation

## 2020-08-26 DIAGNOSIS — O0990 Supervision of high risk pregnancy, unspecified, unspecified trimester: Secondary | ICD-10-CM | POA: Diagnosis present

## 2020-08-26 NOTE — Progress Notes (Signed)
Pt reports yellow vaginal discharge x1 week.  Rpt C/S scheduled on 11/27

## 2020-08-26 NOTE — Progress Notes (Signed)
   PRENATAL VISIT NOTE  Subjective:  Maureen Cameron is a 29 y.o. G2P1001 at [redacted]w[redacted]d being seen today for ongoing prenatal care.  She is currently monitored for the following issues for this high-risk pregnancy and has Maternal varicella, non-immune; Supervision of high risk pregnancy, antepartum; History of cesarean section; Alpha thalassemia silent carrier; Gestational diabetes mellitus (GDM) controlled on oral hypoglycemic drug, antepartum; and LGA (large for gestational age) fetus affecting management of mother on their problem list.  Patient reports no complaints.  Contractions: Irregular. Vag. Bleeding: None.  Movement: Present. Denies leaking of fluid.   The following portions of the patient's history were reviewed and updated as appropriate: allergies, current medications, past family history, past medical history, past social history, past surgical history and problem list.   Objective:   Vitals:   08/26/20 0931  BP: 125/79  Pulse: (!) 104  Weight: 165 lb 8 oz (75.1 kg)    Fetal Status: Fetal Heart Rate (bpm): NST   Movement: Present     General:  Alert, oriented and cooperative. Patient is in no acute distress.  Skin: Skin is warm and dry. No rash noted.   Cardiovascular: Normal heart rate noted  Respiratory: Normal respiratory effort, no problems with respiration noted  Abdomen: Soft, gravid, appropriate for gestational age.  Pain/Pressure: Present     Pelvic: Cervical exam deferred        Extremities: Normal range of motion.  Edema: None  Mental Status: Normal mood and affect. Normal behavior. Normal judgment and thought content.   Assessment and Plan:  Pregnancy: G2P1001 at [redacted]w[redacted]d 1. Supervision of high risk pregnancy, antepartum Patient is doing well today without complaints Cultures collected  2. GDM, class A2 Patient with normal readings with the exception of post dinner which remain elevated Advised patient to increase metformin to 1000 mg as previously discussed with  Dr. Vergie Living Follow up growth ultrasound ordered Continue weekly antenatal testing- BPP 10/10 today  3. History of cesarean section Patient scheduled for repeat c-section Patient declined contraception    Preterm labor symptoms and general obstetric precautions including but not limited to vaginal bleeding, contractions, leaking of fluid and fetal movement were reviewed in detail with the patient. Please refer to After Visit Summary for other counseling recommendations.   Return in about 1 week (around 09/02/2020).  Future Appointments  Date Time Provider Department Center  08/26/2020 10:15 AM Catalina Antigua, MD [facility]  08/26/2020 10:35 AM [facility] US1 [facility]  09/13/2020  2:30 PM [facility] US3 [facility]    Catalina Antigua, MD

## 2020-08-27 ENCOUNTER — Encounter: Payer: Medicaid Other | Admitting: Obstetrics and Gynecology

## 2020-08-27 LAB — CERVICOVAGINAL ANCILLARY ONLY
Bacterial Vaginitis (gardnerella): POSITIVE — AB
Candida Glabrata: NEGATIVE
Candida Vaginitis: POSITIVE — AB
Chlamydia: NEGATIVE
Comment: NEGATIVE
Comment: NEGATIVE
Comment: NEGATIVE
Comment: NEGATIVE
Comment: NORMAL
Neisseria Gonorrhea: NEGATIVE

## 2020-08-30 ENCOUNTER — Other Ambulatory Visit: Payer: Self-pay | Admitting: Obstetrics and Gynecology

## 2020-08-30 ENCOUNTER — Encounter (HOSPITAL_COMMUNITY): Payer: Self-pay

## 2020-08-30 LAB — CULTURE, BETA STREP (GROUP B ONLY): Strep Gp B Culture: NEGATIVE

## 2020-08-30 MED ORDER — FLUCONAZOLE 150 MG PO TABS: 150.0000 mg | ORAL_TABLET | Freq: Once | ORAL | 3 refills | Status: AC

## 2020-08-30 MED ORDER — METRONIDAZOLE 500 MG PO TABS: 500.0000 mg | ORAL_TABLET | Freq: Two times a day (BID) | ORAL | 0 refills | Status: DC

## 2020-08-30 NOTE — Addendum Note (Signed)
Addended by: Catalina Antigua on: 08/30/2020 01:02 PM   Modules accepted: Orders

## 2020-08-30 NOTE — Patient Instructions (Signed)
Maureen Cameron  08/30/2020   Your procedure is scheduled on:  09/11/2020  Arrive at 0730 at [employer] [facility] on [address] at [facility]  and [employer]. You are invited to use the FREE valet parking or use the Visitor's parking deck.  Pick up the phone at the desk and dial [phone].  Call this number if you have problems the morning of surgery: [phone]  Remember:   Do not eat food:(After Midnight) Desps de medianoche.  Do not drink clear liquids: (After Midnight) Desps de medianoche.  Take these medicines the morning of surgery with A SIP OF WATER:  Do not take metformin the night before surgery and no medications the day of surgery   Do not wear jewelry, make-up or nail polish.  Do not wear lotions, powders, or perfumes. Do not wear deodorant.  Do not shave 48 hours prior to surgery.  Do not bring valuables to the hospital.  [facility] is not   responsible for any belongings or valuables brought to the hospital.  Contacts, dentures or bridgework may not be worn into surgery.  Leave suitcase in the car. After surgery it may be brought to your room.  For patients admitted to the hospital, checkout time is 11:00 AM the day of              discharge.      Please read over the following fact sheets that you were given:     Preparing for Surgery

## 2020-09-02 ENCOUNTER — Other Ambulatory Visit: Payer: Self-pay

## 2020-09-02 ENCOUNTER — Encounter: Payer: Self-pay | Admitting: Obstetrics & Gynecology

## 2020-09-02 ENCOUNTER — Ambulatory Visit (INDEPENDENT_AMBULATORY_CARE_PROVIDER_SITE_OTHER): Payer: Medicaid Other | Admitting: Obstetrics & Gynecology

## 2020-09-02 ENCOUNTER — Ambulatory Visit (INDEPENDENT_AMBULATORY_CARE_PROVIDER_SITE_OTHER): Payer: Medicaid Other | Admitting: *Deleted

## 2020-09-02 ENCOUNTER — Ambulatory Visit: Payer: Self-pay

## 2020-09-02 VITALS — BP 122/79 | HR 97 | Wt 166.0 lb

## 2020-09-02 DIAGNOSIS — O24415 Gestational diabetes mellitus in pregnancy, controlled by oral hypoglycemic drugs: Secondary | ICD-10-CM

## 2020-09-02 DIAGNOSIS — O0990 Supervision of high risk pregnancy, unspecified, unspecified trimester: Secondary | ICD-10-CM

## 2020-09-02 NOTE — Progress Notes (Signed)
Pt has [place] for growth/BPP @ [facility] on 11/22.  Repeat C/S scheduled on 11/27.

## 2020-09-02 NOTE — Progress Notes (Signed)
CBG from [employer]:

## 2020-09-02 NOTE — Progress Notes (Signed)
Patient ID: Maureen Cameron, female   DOB: 1991/05/14, 29 y.o.   MRN: [id number]   PRENATAL VISIT NOTE  Subjective:  Maureen Cameron is a 29 y.o. G2P1001 at [redacted]w[redacted]d being seen today for ongoing prenatal care.  She is currently monitored for the following issues for this high-risk pregnancy and has Maternal varicella, non-immune; Supervision of high risk pregnancy, antepartum; History of cesarean section; Alpha thalassemia silent carrier; Gestational diabetes mellitus (GDM) controlled on oral hypoglycemic drug, antepartum; and LGA (large for gestational age) fetus affecting management of mother on their problem list.  Patient reports no complaints.  Contractions: Irregular. Vag. Bleeding: None.  Movement: Present. Denies leaking of fluid.   The following portions of the patient's history were reviewed and updated as appropriate: allergies, current medications, past family history, past medical history, past social history, past surgical history and problem list.   Objective:   Vitals:   09/02/20 0914  BP: 122/79  Pulse: 97  Weight: 166 lb (75.3 kg)    Fetal Status: Fetal Heart Rate (bpm): NST   Movement: Present     General:  Alert, oriented and cooperative. Patient is in no acute distress.  Skin: Skin is warm and dry. No rash noted.   Cardiovascular: Normal heart rate noted  Respiratory: Normal respiratory effort, no problems with respiration noted  Abdomen: Soft, gravid, appropriate for gestational age.  Pain/Pressure: Present     Pelvic: Cervical exam deferred        Extremities: Normal range of motion.  Edema: None  Mental Status: Normal mood and affect. Normal behavior. Normal judgment and thought content.   Assessment and Plan:  Pregnancy: G2P1001 at [redacted]w[redacted]d 1. Supervision of high risk pregnancy, antepartum Continue antenatal testing. ERCS for 11/27  2. Gestational diabetes mellitus (GDM) controlled on oral hypoglycemic drug, antepartum Good control, continue new metformin regimen  500am, 1000mg  pm.   Term labor symptoms and general obstetric precautions including but not limited to vaginal bleeding, contractions, leaking of fluid and fetal movement were reviewed in detail with the patient. Please refer to After Visit Summary for other counseling recommendations.   Return in 1 week (on 09/09/2020).  Future Appointments  Date Time Provider Department Center  09/02/2020 10:15 AM 09/04/2020, MD [facility]  09/02/2020 10:35 AM [facility] US1 [facility]  09/06/2020  1:30 PM [facility] NURSE [facility]  09/06/2020  1:45 PM [facility] US6 [facility]  09/06/2020  3:15 PM [facility] NST [facility]  09/08/2020 10:10 AM [facility]-SCREENING [facility] None  09/08/2020 11:00 AM [facility]-LD PAT 1 [facility]IN[facility] None    09/10/2020, MD

## 2020-09-03 ENCOUNTER — Encounter: Payer: Medicaid Other | Admitting: Obstetrics and Gynecology

## 2020-09-06 ENCOUNTER — Encounter: Payer: Self-pay | Admitting: *Deleted

## 2020-09-06 ENCOUNTER — Ambulatory Visit: Payer: Medicaid Other | Attending: Obstetrics and Gynecology

## 2020-09-06 ENCOUNTER — Encounter: Payer: Self-pay | Admitting: Obstetrics & Gynecology

## 2020-09-06 ENCOUNTER — Encounter: Payer: Medicaid Other | Admitting: Obstetrics & Gynecology

## 2020-09-06 ENCOUNTER — Ambulatory Visit: Payer: Medicaid Other | Admitting: *Deleted

## 2020-09-06 ENCOUNTER — Other Ambulatory Visit: Payer: Self-pay

## 2020-09-06 ENCOUNTER — Other Ambulatory Visit: Payer: Medicaid Other

## 2020-09-06 ENCOUNTER — Ambulatory Visit (INDEPENDENT_AMBULATORY_CARE_PROVIDER_SITE_OTHER): Payer: Medicaid Other | Admitting: Obstetrics & Gynecology

## 2020-09-06 DIAGNOSIS — O0990 Supervision of high risk pregnancy, unspecified, unspecified trimester: Secondary | ICD-10-CM

## 2020-09-06 DIAGNOSIS — O24419 Gestational diabetes mellitus in pregnancy, unspecified control: Secondary | ICD-10-CM | POA: Diagnosis not present

## 2020-09-06 DIAGNOSIS — O24415 Gestational diabetes mellitus in pregnancy, controlled by oral hypoglycemic drugs: Secondary | ICD-10-CM

## 2020-09-06 NOTE — Progress Notes (Signed)
Pt had [place] for growth & BPP today @ [facility]

## 2020-09-06 NOTE — Progress Notes (Signed)
Patient ID: Maureen Cameron, female   DOB: Oct 04, 1991, 29 y.o.   MRN: [id number]    PRENATAL VISIT NOTE  Subjective:  Maureen Cameron is a 29 y.o. G2P1001 at [redacted]w[redacted]d being seen today for ongoing prenatal care.  She is currently monitored for the following issues for this high-risk pregnancy and has Maternal varicella, non-immune; Supervision of high risk pregnancy, antepartum; History of cesarean section; Alpha thalassemia silent carrier; Gestational diabetes mellitus (GDM) controlled on oral hypoglycemic drug, antepartum; and LGA (large for gestational age) fetus affecting management of mother on their problem list.  Patient reports occasional contractions.  Contractions: Irregular. Vag. Bleeding: None.  Movement: Present. Denies leaking of fluid.   The following portions of the patient's history were reviewed and updated as appropriate: allergies, current medications, past family history, past medical history, past social history, past surgical history and problem list.   Objective:   Vitals:   09/06/20 1529  BP: 122/69  Pulse: 81  Weight: 165 lb 3.2 oz (74.9 kg)    Fetal Status: Fetal Heart Rate (bpm): NST   Movement: Present     General:  Alert, oriented and cooperative. Patient is in no acute distress.  Skin: Skin is warm and dry. No rash noted.   Cardiovascular: Normal heart rate noted  Respiratory: Normal respiratory effort, no problems with respiration noted  Abdomen: Soft, gravid, appropriate for gestational age.  Pain/Pressure: Present     Pelvic: Cervical exam deferred        Extremities: Normal range of motion.  Edema: None  Mental Status: Normal mood and affect. Normal behavior. Normal judgment and thought content.   Assessment and Plan:  Pregnancy: G2P1001 at [redacted]w[redacted]d 1. Gestational diabetes mellitus (GDM) controlled on oral hypoglycemic drug, antepartum - Glucose log reviewed, good control  - Fetal nonstress test  2. Supervision of high risk pregnancy, antepartum R/P C/S  scheduled, questions answered.   Term labor symptoms and general obstetric precautions including but not limited to vaginal bleeding, contractions, leaking of fluid and fetal movement were reviewed in detail with the patient. Please refer to After Visit Summary for other counseling recommendations.   Return in 1 week (on 09/13/2020).  Future Appointments  Date Time Provider Department Center  09/08/2020 10:10 AM [facility]-SCREENING [facility] None  09/08/2020 11:00 AM [facility]-LD PAT 1 [facility] None    Maureen Chamber, MD

## 2020-09-06 NOTE — Patient Instructions (Signed)
Cesarean Delivery Cesarean birth, or cesarean delivery, is the surgical delivery of a baby through an incision in the abdomen and the uterus. This may be referred to as a C-section. This procedure may be scheduled ahead of time, or it may be done in an emergency situation. Tell a health care provider about:  Any allergies you have.  All medicines you are taking, including vitamins, herbs, eye drops, creams, and over-the-counter medicines.  Any problems you or family members have had with anesthetic medicines.  Any blood disorders you have.  Any surgeries you have had.  Any medical conditions you have.  Whether you or any members of your family have a history of deep vein thrombosis (DVT) or pulmonary embolism (PE). What are the risks? Generally, this is a safe procedure. However, problems may occur, including:  Infection.  Bleeding.  Allergic reactions to medicines.  Damage to other structures or organs.  Blood clots.  Injury to your baby. What happens before the procedure? General instructions  Follow instructions from your health care provider about eating or drinking restrictions.  If you know that you are going to have a cesarean delivery, do not shave your pubic area. Shaving before the procedure may increase your risk of infection.  Plan to have someone take you home from the hospital.  Ask your health care provider what steps will be taken to prevent infection. These may include: ? Removing hair at the surgery site. ? Washing skin with a germ-killing soap. ? Taking antibiotic medicine.  Depending on the reason for your cesarean delivery, you may have a physical exam or additional testing, such as an ultrasound.  You may have your blood or urine tested. Questions for your health care provider  Ask your health care provider about: ? Changing or stopping your regular medicines. This is especially important if you are taking diabetes medicines or blood  thinners. ? Your pain management plan. This is especially important if you plan to breastfeed your baby. ? How long you will be in the hospital after the procedure. ? Any concerns you may have about receiving blood products, if you need them during the procedure. ? Cord blood banking, if you plan to collect your baby's umbilical cord blood.  You may also want to ask your health care provider: ? Whether you will be able to hold or breastfeed your baby while you are still in the operating room. ? Whether your baby can stay with you immediately after the procedure and during your recovery. ? Whether a family member or a person of your choice can go with you into the operating room and stay with you during the procedure, immediately after the procedure, and during your recovery. What happens during the procedure?   An IV will be inserted into one of your veins.  Fluid and medicines, such as antibiotics, will be given before the surgery.  Fetal monitors will be placed on your abdomen to check your baby's heart rate.  You may be given a special warming gown to wear to keep your temperature stable.  A catheter may be inserted into your bladder through your urethra. This drains your urine during the procedure.  You may be given one or more of the following: ? A medicine to numb the area (local anesthetic). ? A medicine to make you fall asleep (general anesthetic). ? A medicine (regional anesthetic) that is injected into your back or through a small thin tube placed in your back (spinal anesthetic or epidural anesthetic).   This numbs everything below the injection site and allows you to stay awake during your procedure. If this makes you feel nauseous, tell your health care provider. Medicines will be available to help reduce any nausea you may feel.  An incision will be made in your abdomen, and then in your uterus.  If you are awake during your procedure, you may feel tugging and pulling in  your abdomen, but you should not feel pain. If you feel pain, tell your health care provider immediately.  Your baby will be removed from your uterus. You may feel more pressure or pushing while this happens.  Immediately after birth, your baby will be dried and kept warm. You may be able to hold and breastfeed your baby.  The umbilical cord may be clamped and cut during this time. This usually occurs after waiting a period of 1-2 minutes after delivery.  Your placenta will be removed from your uterus.  Your incisions will be closed with stitches (sutures). Staples, skin glue, or adhesive strips may also be applied to the incision in your abdomen.  Bandages (dressings) may be placed over the incision in your abdomen. The procedure may vary among health care providers and hospitals. What happens after the procedure?  Your blood pressure, heart rate, breathing rate, and blood oxygen level will be monitored until you are discharged from the hospital.  You may continue to receive fluids and medicines through an IV.  You will have some pain. Medicines will be available to help control your pain.  To help prevent blood clots: ? You may be given medicines. ? You may have to wear compression stockings or devices. ? You will be encouraged to walk around when you are able.  Hospital staff will encourage and support bonding with your baby. Your hospital may have you and your baby to stay in the same room (rooming in) during your hospital stay to encourage successful bonding and breastfeeding.  You may be encouraged to cough and breathe deeply often. This helps to prevent lung problems.  If you have a catheter draining your urine, it will be removed as soon as possible after your procedure. Summary  Cesarean birth, or cesarean delivery, is the surgical delivery of a baby through an incision in the abdomen and the uterus.  Follow instructions from your health care provider about eating or  drinking restrictions before the procedure.  You will have some pain after the procedure. Medicines will be available to help control your pain.  Hospital staff will encourage and support bonding with your baby after the procedure. Your hospital may have you and your baby to stay in the same room (rooming in) during your hospital stay to encourage successful bonding and breastfeeding. This information is not intended to replace advice given to you by your health care provider. Make sure you discuss any questions you have with your health care provider. Document Revised: 04/08/2018 Document Reviewed: 04/08/2018 Elsevier Patient Education  2020 [employer]  

## 2020-09-08 ENCOUNTER — Other Ambulatory Visit: Payer: Self-pay

## 2020-09-08 ENCOUNTER — Encounter: Payer: Medicaid Other | Admitting: Obstetrics and Gynecology

## 2020-09-08 ENCOUNTER — Encounter (HOSPITAL_COMMUNITY)
Admission: RE | Admit: 2020-09-08 | Discharge: 2020-09-08 | Disposition: A | Discharge status: Home / Self Care | Payer: Medicaid Other | Source: Ambulatory Visit | Attending: Family Medicine | Admitting: Family Medicine

## 2020-09-08 ENCOUNTER — Other Ambulatory Visit (HOSPITAL_COMMUNITY)
Admission: RE | Admit: 2020-09-08 | Discharge: 2020-09-08 | Disposition: A | Discharge status: Home / Self Care | Payer: Medicaid Other | Source: Ambulatory Visit | Attending: Obstetrics & Gynecology | Admitting: Obstetrics & Gynecology

## 2020-09-08 DIAGNOSIS — Z01812 Encounter for preprocedural laboratory examination: Secondary | ICD-10-CM | POA: Insufficient documentation

## 2020-09-08 DIAGNOSIS — Z20822 Contact with and (suspected) exposure to covid-19: Secondary | ICD-10-CM | POA: Insufficient documentation

## 2020-09-08 LAB — TYPE AND SCREEN
ABO/RH(D): AB POS
Antibody Screen: NEGATIVE

## 2020-09-08 LAB — CBC
HCT: 39 % (ref 36.0–46.0)
Hemoglobin: 13.1 g/dL (ref 12.0–15.0)
MCH: 26.6 pg (ref 26.0–34.0)
MCHC: 33.6 g/dL (ref 30.0–36.0)
MCV: 79.3 fL — ABNORMAL LOW (ref 80.0–100.0)
Platelets: 246 10*3/uL (ref 150–400)
RBC: 4.92 MIL/uL (ref 3.87–5.11)
RDW: 16.1 % — ABNORMAL HIGH (ref 11.5–15.5)
WBC: 6.9 10*3/uL (ref 4.0–10.5)
nRBC: 0 % (ref 0.0–0.2)

## 2020-09-08 LAB — SARS CORONAVIRUS 2 (TAT 6-24 HRS): SARS Coronavirus 2: NEGATIVE

## 2020-09-09 LAB — RPR: RPR Ser Ql: NONREACTIVE

## 2020-09-10 NOTE — Anesthesia Preprocedure Evaluation (Addendum)
Anesthesia Evaluation  Patient identified by MRN, date of birth, ID band Patient awake    Reviewed: Allergy & Precautions, NPO status , Patient's Chart, lab work & pertinent test results  Airway Mallampati: II  TM Distance: >3 FB Neck ROM: Full    Dental no notable dental hx. (+) Teeth Intact, Dental Advisory Given   Pulmonary neg pulmonary ROS,    Pulmonary exam normal breath sounds clear to auscultation       Cardiovascular Exercise Tolerance: Good negative cardio ROS Normal cardiovascular exam Rhythm:Regular Rate:Normal     Neuro/Psych negative neurological ROS  negative psych ROS   GI/Hepatic negative GI ROS, Neg liver ROS,   Endo/Other  diabetes, Gestational  Renal/GU negative Renal ROS     Musculoskeletal   Abdominal   Peds  Hematology Alpha Thalassemia Hgb 13.1 Plt 246   Anesthesia Other Findings   Reproductive/Obstetrics (+) Pregnancy                            Anesthesia Physical Anesthesia Plan  ASA: II  Anesthesia Plan: Spinal   Post-op Pain Management:    Induction:   PONV Risk Score and Plan: Treatment may vary due to age or medical condition  Airway Management Planned: Nasal Cannula and Natural Airway  Additional Equipment: None  Intra-op Plan:   Post-operative Plan:   Informed Consent: I have reviewed the patients History and Physical, chart, labs and discussed the procedure including the risks, benefits and alternatives for the proposed anesthesia with the patient or authorized representative who has indicated his/her understanding and acceptance.     Dental advisory given  Plan Discussed with: CRNA and Anesthesiologist  Anesthesia Plan Comments: (39.1 Wk G2P1 for Rpt C/S for LGA)       Anesthesia Quick Evaluation

## 2020-09-11 ENCOUNTER — Encounter (HOSPITAL_COMMUNITY): Payer: Self-pay | Admitting: Obstetrics & Gynecology

## 2020-09-11 ENCOUNTER — Encounter (HOSPITAL_COMMUNITY)
Admission: RE | Disposition: A | Discharge status: Home / Self Care | Payer: Self-pay | Source: Home / Self Care | Attending: Obstetrics & Gynecology

## 2020-09-11 ENCOUNTER — Inpatient Hospital Stay (HOSPITAL_COMMUNITY): Payer: Medicaid Other | Admitting: Anesthesiology

## 2020-09-11 ENCOUNTER — Other Ambulatory Visit: Payer: Self-pay

## 2020-09-11 ENCOUNTER — Inpatient Hospital Stay (HOSPITAL_COMMUNITY)
Admission: RE | Admit: 2020-09-11 | Discharge: 2020-09-13 | DRG: 788 | Disposition: A | Discharge status: Home / Self Care | Payer: Medicaid Other | Attending: Obstetrics & Gynecology | Admitting: Obstetrics & Gynecology

## 2020-09-11 DIAGNOSIS — O34211 Maternal care for low transverse scar from previous cesarean delivery: Principal | ICD-10-CM | POA: Diagnosis present

## 2020-09-11 DIAGNOSIS — Z8632 Personal history of gestational diabetes: Secondary | ICD-10-CM | POA: Diagnosis present

## 2020-09-11 DIAGNOSIS — Z3A39 39 weeks gestation of pregnancy: Secondary | ICD-10-CM | POA: Diagnosis not present

## 2020-09-11 DIAGNOSIS — D563 Thalassemia minor: Secondary | ICD-10-CM | POA: Diagnosis present

## 2020-09-11 DIAGNOSIS — Z98891 History of uterine scar from previous surgery: Secondary | ICD-10-CM

## 2020-09-11 DIAGNOSIS — O24419 Gestational diabetes mellitus in pregnancy, unspecified control: Secondary | ICD-10-CM | POA: Diagnosis present

## 2020-09-11 DIAGNOSIS — O3660X0 Maternal care for excessive fetal growth, unspecified trimester, not applicable or unspecified: Secondary | ICD-10-CM | POA: Diagnosis present

## 2020-09-11 DIAGNOSIS — O24425 Gestational diabetes mellitus in childbirth, controlled by oral hypoglycemic drugs: Secondary | ICD-10-CM | POA: Diagnosis present

## 2020-09-11 DIAGNOSIS — O0990 Supervision of high risk pregnancy, unspecified, unspecified trimester: Secondary | ICD-10-CM

## 2020-09-11 DIAGNOSIS — O3663X0 Maternal care for excessive fetal growth, third trimester, not applicable or unspecified: Secondary | ICD-10-CM | POA: Diagnosis present

## 2020-09-11 LAB — GLUCOSE, CAPILLARY
Glucose-Capillary: 83 mg/dL (ref 70–99)
Glucose-Capillary: 63 mg/dL — ABNORMAL LOW (ref 70–99)
Glucose-Capillary: 75 mg/dL (ref 70–99)

## 2020-09-11 SURGERY — Surgical Case
Anesthesia: Spinal

## 2020-09-11 MED ORDER — ONDANSETRON HCL 4 MG/2ML IJ SOLN
INTRAMUSCULAR | Status: AC
  Filled 2020-09-11: qty 2

## 2020-09-11 MED ORDER — ACETAMINOPHEN 500 MG PO TABS
1000.0000 mg | ORAL_TABLET | ORAL | Status: AC
  Administered 2020-09-11: 1000 mg via ORAL

## 2020-09-11 MED ORDER — ZOLPIDEM TARTRATE 5 MG PO TABS: 5.0000 mg | ORAL_TABLET | Freq: Every evening | ORAL | Status: DC | PRN

## 2020-09-11 MED ORDER — MORPHINE SULFATE (PF) 0.5 MG/ML IJ SOLN
INTRAMUSCULAR | Status: DC | PRN
  Administered 2020-09-11: 150 mg via EPIDURAL

## 2020-09-11 MED ORDER — DIPHENHYDRAMINE HCL 25 MG PO CAPS: 25.0000 mg | ORAL_CAPSULE | Freq: Four times a day (QID) | ORAL | Status: DC | PRN

## 2020-09-11 MED ORDER — ONDANSETRON HCL 4 MG/2ML IJ SOLN: 4.0000 mg | Freq: Three times a day (TID) | INTRAMUSCULAR | Status: DC | PRN

## 2020-09-11 MED ORDER — HYDROMORPHONE HCL 1 MG/ML IJ SOLN: 1.0000 mg | INTRAMUSCULAR | Status: DC | PRN

## 2020-09-11 MED ORDER — FENTANYL CITRATE (PF) 100 MCG/2ML IJ SOLN
INTRAMUSCULAR | Status: AC
  Filled 2020-09-11: qty 2

## 2020-09-11 MED ORDER — TETANUS-DIPHTH-ACELL PERTUSSIS 5-2.5-18.5 LF-MCG/0.5 IM SUSY: 0.5000 mL | PREFILLED_SYRINGE | Freq: Once | INTRAMUSCULAR | Status: DC

## 2020-09-11 MED ORDER — NALOXONE HCL 4 MG/10ML IJ SOLN
1.0000 ug/kg/h | INTRAVENOUS | Status: DC | PRN
  Filled 2020-09-11: qty 5

## 2020-09-11 MED ORDER — TRAMADOL HCL 50 MG PO TABS: 50.0000 mg | ORAL_TABLET | Freq: Four times a day (QID) | ORAL | Status: DC | PRN

## 2020-09-11 MED ORDER — GABAPENTIN 300 MG PO CAPS
300.0000 mg | ORAL_CAPSULE | ORAL | Status: AC
  Administered 2020-09-11: 300 mg via ORAL

## 2020-09-11 MED ORDER — MEPERIDINE HCL 25 MG/ML IJ SOLN
INTRAMUSCULAR | Status: DC | PRN
  Administered 2020-09-11 (×2): 12.5 mg via INTRAVENOUS

## 2020-09-11 MED ORDER — ACETAMINOPHEN 500 MG PO TABS
ORAL_TABLET | ORAL | Status: AC
  Filled 2020-09-11: qty 2

## 2020-09-11 MED ORDER — ALBUMIN HUMAN 5 % IV SOLN
INTRAVENOUS | Status: AC
  Filled 2020-09-11: qty 250

## 2020-09-11 MED ORDER — SODIUM CHLORIDE 0.9 % IV SOLN
INTRAVENOUS | Status: AC
  Filled 2020-09-11: qty 2

## 2020-09-11 MED ORDER — MENTHOL 3 MG MT LOZG: 1.0000 | LOZENGE | OROMUCOSAL | Status: DC | PRN

## 2020-09-11 MED ORDER — MORPHINE SULFATE (PF) 0.5 MG/ML IJ SOLN
INTRAMUSCULAR | Status: AC
  Filled 2020-09-11: qty 10

## 2020-09-11 MED ORDER — SIMETHICONE 80 MG PO CHEW: 80.0000 mg | CHEWABLE_TABLET | ORAL | Status: DC | PRN

## 2020-09-11 MED ORDER — SODIUM CHLORIDE 0.9 % IV SOLN: INTRAVENOUS | Status: DC | PRN

## 2020-09-11 MED ORDER — NALBUPHINE HCL 10 MG/ML IJ SOLN: 5.0000 mg | Freq: Once | INTRAMUSCULAR | Status: DC | PRN

## 2020-09-11 MED ORDER — DIPHENHYDRAMINE HCL 50 MG/ML IJ SOLN: 12.5000 mg | INTRAMUSCULAR | Status: DC | PRN

## 2020-09-11 MED ORDER — GABAPENTIN 300 MG PO CAPS
ORAL_CAPSULE | ORAL | Status: AC
  Filled 2020-09-11: qty 1

## 2020-09-11 MED ORDER — DEXAMETHASONE SODIUM PHOSPHATE 4 MG/ML IJ SOLN
INTRAMUSCULAR | Status: AC
  Filled 2020-09-11: qty 1

## 2020-09-11 MED ORDER — PHENYLEPHRINE HCL-NACL 20-0.9 MG/250ML-% IV SOLN
INTRAVENOUS | Status: DC | PRN
  Administered 2020-09-11: 60 ug/min via INTRAVENOUS

## 2020-09-11 MED ORDER — MAGNESIUM HYDROXIDE 400 MG/5ML PO SUSP: 30.0000 mL | ORAL | Status: DC | PRN

## 2020-09-11 MED ORDER — LACTATED RINGERS IV SOLN: INTRAVENOUS | Status: DC

## 2020-09-11 MED ORDER — FERROUS SULFATE 325 (65 FE) MG PO TABS
325.0000 mg | ORAL_TABLET | Freq: Two times a day (BID) | ORAL | Status: DC
  Administered 2020-09-12: 325 mg via ORAL
  Filled 2020-09-11: qty 1

## 2020-09-11 MED ORDER — SOD CITRATE-CITRIC ACID 500-334 MG/5ML PO SOLN
ORAL | Status: AC
  Filled 2020-09-11: qty 30

## 2020-09-11 MED ORDER — OXYCODONE HCL 5 MG PO TABS: 5.0000 mg | ORAL_TABLET | ORAL | Status: DC | PRN

## 2020-09-11 MED ORDER — SOD CITRATE-CITRIC ACID 500-334 MG/5ML PO SOLN
30.0000 mL | ORAL | Status: AC
  Administered 2020-09-11: 30 mL via ORAL

## 2020-09-11 MED ORDER — COCONUT OIL OIL
1.0000 "application " | TOPICAL_OIL | Status: DC | PRN
  Administered 2020-09-12: 1 via TOPICAL

## 2020-09-11 MED ORDER — FENTANYL CITRATE (PF) 100 MCG/2ML IJ SOLN
INTRAMUSCULAR | Status: DC | PRN
  Administered 2020-09-11: 15 ug via INTRAVENOUS

## 2020-09-11 MED ORDER — ONDANSETRON HCL 4 MG/2ML IJ SOLN
INTRAMUSCULAR | Status: DC | PRN
  Administered 2020-09-11: 4 mg via INTRAVENOUS

## 2020-09-11 MED ORDER — MEPERIDINE HCL 25 MG/ML IJ SOLN
INTRAMUSCULAR | Status: AC
  Filled 2020-09-11: qty 1

## 2020-09-11 MED ORDER — OXYCODONE-ACETAMINOPHEN 5-325 MG PO TABS: 2.0000 | ORAL_TABLET | ORAL | Status: DC | PRN

## 2020-09-11 MED ORDER — SODIUM CHLORIDE 0.9 % IV SOLN
2.0000 g | INTRAVENOUS | Status: AC
  Administered 2020-09-11: 2 g via INTRAVENOUS

## 2020-09-11 MED ORDER — KETOROLAC TROMETHAMINE 30 MG/ML IJ SOLN
INTRAMUSCULAR | Status: AC
  Filled 2020-09-11: qty 1

## 2020-09-11 MED ORDER — POVIDONE-IODINE 10 % EX SWAB
2.0000 "application " | Freq: Once | CUTANEOUS | Status: AC
  Administered 2020-09-11: 2 via TOPICAL

## 2020-09-11 MED ORDER — OXYTOCIN-SODIUM CHLORIDE 30-0.9 UT/500ML-% IV SOLN
INTRAVENOUS | Status: AC
  Filled 2020-09-11: qty 500

## 2020-09-11 MED ORDER — SODIUM CHLORIDE (PF) 0.9 % IJ SOLN
INTRAMUSCULAR | Status: AC
  Filled 2020-09-11: qty 10

## 2020-09-11 MED ORDER — PHENYLEPHRINE HCL-NACL 20-0.9 MG/250ML-% IV SOLN
INTRAVENOUS | Status: AC
  Filled 2020-09-11: qty 250

## 2020-09-11 MED ORDER — KETOROLAC TROMETHAMINE 30 MG/ML IJ SOLN
30.0000 mg | Freq: Four times a day (QID) | INTRAMUSCULAR | Status: AC
  Administered 2020-09-11 – 2020-09-12 (×2): 30 mg via INTRAVENOUS
  Filled 2020-09-11 (×2): qty 1

## 2020-09-11 MED ORDER — OXYCODONE HCL 5 MG PO TABS: 5.0000 mg | ORAL_TABLET | Freq: Once | ORAL | Status: DC | PRN

## 2020-09-11 MED ORDER — NALBUPHINE HCL 10 MG/ML IJ SOLN: 5.0000 mg | INTRAMUSCULAR | Status: DC | PRN

## 2020-09-11 MED ORDER — SODIUM CHLORIDE 0.9% FLUSH: 3.0000 mL | INTRAVENOUS | Status: DC | PRN

## 2020-09-11 MED ORDER — SENNOSIDES-DOCUSATE SODIUM 8.6-50 MG PO TABS
2.0000 | ORAL_TABLET | ORAL | Status: DC
  Administered 2020-09-11 – 2020-09-12 (×2): 2 via ORAL
  Filled 2020-09-11 (×2): qty 2

## 2020-09-11 MED ORDER — DIBUCAINE (PERIANAL) 1 % EX OINT: 1.0000 "application " | TOPICAL_OINTMENT | CUTANEOUS | Status: DC | PRN

## 2020-09-11 MED ORDER — GABAPENTIN 100 MG PO CAPS
300.0000 mg | ORAL_CAPSULE | Freq: Two times a day (BID) | ORAL | Status: DC
  Administered 2020-09-11 – 2020-09-13 (×4): 300 mg via ORAL
  Filled 2020-09-11 (×4): qty 3

## 2020-09-11 MED ORDER — WITCH HAZEL-GLYCERIN EX PADS: 1.0000 "application " | MEDICATED_PAD | CUTANEOUS | Status: DC | PRN

## 2020-09-11 MED ORDER — OXYTOCIN-SODIUM CHLORIDE 30-0.9 UT/500ML-% IV SOLN
INTRAVENOUS | Status: DC | PRN
  Administered 2020-09-11: 30 [IU] via INTRAVENOUS

## 2020-09-11 MED ORDER — IBUPROFEN 800 MG PO TABS
800.0000 mg | ORAL_TABLET | Freq: Four times a day (QID) | ORAL | Status: DC
  Administered 2020-09-12 – 2020-09-13 (×5): 800 mg via ORAL
  Filled 2020-09-11 (×5): qty 1

## 2020-09-11 MED ORDER — NALOXONE HCL 0.4 MG/ML IJ SOLN: 0.4000 mg | INTRAMUSCULAR | Status: DC | PRN

## 2020-09-11 MED ORDER — KETOROLAC TROMETHAMINE 30 MG/ML IJ SOLN
30.0000 mg | Freq: Once | INTRAMUSCULAR | Status: AC | PRN
  Administered 2020-09-11: 30 mg via INTRAVENOUS

## 2020-09-11 MED ORDER — ENOXAPARIN SODIUM 40 MG/0.4ML ~~LOC~~ SOLN
40.0000 mg | SUBCUTANEOUS | Status: DC
  Administered 2020-09-12 – 2020-09-13 (×2): 40 mg via SUBCUTANEOUS
  Filled 2020-09-11 (×2): qty 0.4

## 2020-09-11 MED ORDER — SIMETHICONE 80 MG PO CHEW
80.0000 mg | CHEWABLE_TABLET | ORAL | Status: DC
  Administered 2020-09-11 – 2020-09-12 (×2): 80 mg via ORAL
  Filled 2020-09-11 (×2): qty 1

## 2020-09-11 MED ORDER — BUPIVACAINE IN DEXTROSE 0.75-8.25 % IT SOLN
INTRATHECAL | Status: DC | PRN
  Administered 2020-09-11: 12 mg via INTRATHECAL

## 2020-09-11 MED ORDER — PRENATAL MULTIVITAMIN CH
1.0000 | ORAL_TABLET | Freq: Every day | ORAL | Status: DC
  Administered 2020-09-12 – 2020-09-13 (×2): 1 via ORAL
  Filled 2020-09-11 (×2): qty 1

## 2020-09-11 MED ORDER — ONDANSETRON HCL 4 MG/2ML IJ SOLN: 4.0000 mg | Freq: Once | INTRAMUSCULAR | Status: DC | PRN

## 2020-09-11 MED ORDER — MEASLES, MUMPS & RUBELLA VAC IJ SOLR: 0.5000 mL | Freq: Once | INTRAMUSCULAR | Status: DC

## 2020-09-11 MED ORDER — OXYCODONE HCL 5 MG/5ML PO SOLN: 5.0000 mg | Freq: Once | ORAL | Status: DC | PRN

## 2020-09-11 MED ORDER — OXYTOCIN-SODIUM CHLORIDE 30-0.9 UT/500ML-% IV SOLN
2.5000 [IU]/h | INTRAVENOUS | Status: AC
  Administered 2020-09-11: 2.5 [IU]/h via INTRAVENOUS
  Filled 2020-09-11: qty 500

## 2020-09-11 MED ORDER — DIPHENHYDRAMINE HCL 25 MG PO CAPS: 25.0000 mg | ORAL_CAPSULE | ORAL | Status: DC | PRN

## 2020-09-11 MED ORDER — SCOPOLAMINE 1 MG/3DAYS TD PT72: 1.0000 | MEDICATED_PATCH | Freq: Once | TRANSDERMAL | Status: DC

## 2020-09-11 MED ORDER — HYDROMORPHONE HCL 1 MG/ML IJ SOLN: 0.2500 mg | INTRAMUSCULAR | Status: DC | PRN

## 2020-09-11 SURGICAL SUPPLY — 33 items
BENZOIN TINCTURE PRP APPL 2/3 (GAUZE/BANDAGES/DRESSINGS) ×3 IMPLANT
CHLORAPREP W/TINT 26ML (MISCELLANEOUS) ×3 IMPLANT
CLAMP CORD UMBIL (MISCELLANEOUS) IMPLANT
CLOSURE WOUND 1/2 X4 (GAUZE/BANDAGES/DRESSINGS) ×1
CLOTH BEACON ORANGE TIMEOUT ST (SAFETY) ×3 IMPLANT
DRSG OPSITE POSTOP 4X10 (GAUZE/BANDAGES/DRESSINGS) ×3 IMPLANT
ELECT REM PT RETURN 9FT ADLT (ELECTROSURGICAL) ×3
ELECTRODE REM PT RTRN 9FT ADLT (ELECTROSURGICAL) ×1 IMPLANT
EXTRACTOR VACUUM M CUP 4 TUBE (SUCTIONS) IMPLANT
EXTRACTOR VACUUM M CUP 4' TUBE (SUCTIONS)
GLOVE BIOGEL PI IND STRL 7.0 (GLOVE) ×3 IMPLANT
GLOVE BIOGEL PI INDICATOR 7.0 (GLOVE) ×6
GLOVE ECLIPSE 7.0 STRL STRAW (GLOVE) ×3 IMPLANT
GOWN STRL REUS W/TWL LRG LVL3 (GOWN DISPOSABLE) ×6 IMPLANT
KIT ABG SYR 3ML LUER SLIP (SYRINGE) IMPLANT
NEEDLE HYPO 22GX1.5 SAFETY (NEEDLE) ×3 IMPLANT
NEEDLE HYPO 25X5/8 SAFETYGLIDE (NEEDLE) ×3 IMPLANT
NS IRRIG 1000ML POUR BTL (IV SOLUTION) ×3 IMPLANT
PACK C SECTION WH (CUSTOM PROCEDURE TRAY) ×3 IMPLANT
PAD ABD 7.5X8 STRL (GAUZE/BANDAGES/DRESSINGS) ×3 IMPLANT
PAD OB MATERNITY 4.3X12.25 (PERSONAL CARE ITEMS) ×3 IMPLANT
PENCIL SMOKE EVAC W/HOLSTER (ELECTROSURGICAL) ×3 IMPLANT
RTRCTR C-SECT PINK 25CM LRG (MISCELLANEOUS) IMPLANT
STRIP CLOSURE SKIN 1/2X4 (GAUZE/BANDAGES/DRESSINGS) ×2 IMPLANT
SUT PDS AB 0 CTX 36 PDP370T (SUTURE) IMPLANT
SUT PLAIN 2 0 XLH (SUTURE) IMPLANT
SUT VIC AB 0 CTX 36 (SUTURE) ×4
SUT VIC AB 0 CTX36XBRD ANBCTRL (SUTURE) ×2 IMPLANT
SUT VIC AB 4-0 KS 27 (SUTURE) ×3 IMPLANT
SYR CONTROL 10ML LL (SYRINGE) ×3 IMPLANT
TOWEL OR 17X24 6PK STRL BLUE (TOWEL DISPOSABLE) ×3 IMPLANT
TRAY FOLEY W/BAG SLVR 14FR LF (SET/KITS/TRAYS/PACK) ×3 IMPLANT
WATER STERILE IRR 1000ML POUR (IV SOLUTION) ×3 IMPLANT

## 2020-09-11 NOTE — Anesthesia Postprocedure Evaluation (Signed)
Anesthesia Post Note  Patient: Maureen Cameron  Procedure(s) Performed: REPEAT CESAREAN SECTION (N/A )     Patient location during evaluation: Nursing Unit Anesthesia Type: Spinal Level of consciousness: oriented and awake and alert Pain management: pain level controlled Vital Signs Assessment: post-procedure vital signs reviewed and stable Respiratory status: spontaneous breathing and respiratory function stable Cardiovascular status: blood pressure returned to baseline and stable Postop Assessment: no headache, no backache, no apparent nausea or vomiting and patient able to bend at knees Anesthetic complications: no   No complications documented.  Last Vitals:  Vitals:   09/11/20 1310 09/11/20 1405  BP: 114/64 109/76  Pulse: 70 75  Resp: 20 19  Temp: (!) 36.4 C 36.5 C  SpO2: 100% 98%    Last Pain:  Vitals:   09/11/20 1410  TempSrc:   PainSc: 0-No pain   Pain Goal:                   Trevor Iha

## 2020-09-11 NOTE — Op Note (Signed)
Maureen Cameron PROCEDURE DATE: 09/11/2020  PREOPERATIVE DIAGNOSES: Intrauterine pregnancy at [redacted]w[redacted]d weeks gestation; previous cesarean section; oral hypoglycemic controlled gestational diabetes; fetus large for gestational age  POSTOPERATIVE DIAGNOSES: The same  PROCEDURE: Repeat Low Transverse Cesarean Section  SURGEON:  Dr. Jaynie Collins  ASSISTANT:  Dr. Lynnda Shields  ANESTHESIOLOGY TEAM: Anesthesiologist: Trevor Iha, MD CRNA: Armanda Heritage, CRNA  INDICATIONS: Maureen Cameron is a 29 y.o. G2P1001 at [redacted]w[redacted]d here for cesarean section secondary to the indications listed under preoperative diagnoses; please see preoperative note for further details.  The risks of surgery were discussed with the patient including but were not limited to: bleeding which may require transfusion or reoperation; infection which may require antibiotics; injury to bowel, bladder, ureters or other surrounding organs; injury to the fetus; need for additional procedures including hysterectomy in the event of a life-threatening hemorrhage; formation of adhesions; placental abnormalities wth subsequent pregnancies; incisional problems; thromboembolic phenomenon and other postoperative/anesthesia complications.  The patient concurred with the proposed plan, giving informed written consent for the procedure.    FINDINGS:  Viable female infant in cephalic presentation.  Apgars 8 and 8.  Clear amniotic fluid.  Intact placenta, three vessel cord.  Normal uterus, fallopian tubes and ovaries bilaterally. No intraperitoneal adhesions noted.  ANESTHESIA: Spinal INTRAVENOUS FLUIDS: 1200 ml   ESTIMATED BLOOD LOSS: 400 ml URINE OUTPUT:  100 ml SPECIMENS: Placenta sent to L&D COMPLICATIONS: None immediate  PROCEDURE IN DETAIL:  The patient preoperatively received intravenous antibiotics and had sequential compression devices applied to her lower extremities.  She was then taken to the operating room where spinal  anesthesia was administered and was found to be adequate. She was then placed in a dorsal supine position with a leftward tilt, and prepped and draped in a sterile manner.  A foley catheter was placed into her bladder and attached to constant gravity.  After an adequate timeout was performed, a Pfannenstiel skin incision was made with scalpel on her preexisting scar and carried through to the underlying layer of fascia. The fascia was incised in the midline, and this incision was extended bilaterally using the Mayo scissors.  Kocher clamps were applied to the superior aspect of the fascial incision and the underlying rectus muscles were dissected off bluntly and sharply.  A similar process was carried out on the inferior aspect of the fascial incision. The rectus muscles were separated in the midline and the peritoneum was entered bluntly. The Alexis self-retaining retractor was introduced into the abdominal cavity.  Attention was turned to the lower uterine segment where a low transverse hysterotomy was made with a scalpel and extended bilaterally bluntly.  The infant was successfully delivered, the cord was clamped and cut after one minute, and the infant was handed over to the awaiting neonatology team. Uterine massage was then administered, and the placenta delivered intact with a three-vessel cord. The uterus was then cleared of clots and debris.  The hysterotomy was closed with 0 Vicryl in a running locked fashion, and an imbricating layer was also placed with 0 Vicryl.  Figure-of-eight 0 Vicryl serosal stitches were placed to help with hemostasis.  The pelvis was cleared of all clot and debris. Hemostasis was confirmed on all surfaces.  The retractor was removed.  The peritoneum was closed with a 0 Vicryl running stitch. The fascia was then closed using 0 PDS in a running fashion.  The subcutaneous layer was irrigated, reapproximated with 2-0 plain gut interrupted stitches, and the skin was closed with a  4-0  Vicryl subcuticular stitch. The patient tolerated the procedure well. Sponge, instrument and needle counts were correct x 3.  She was taken to the recovery room in stable condition.    Jaynie Collins, MD, FACOG Obstetrician & Gynecologist, [facility] for [employer], [facility]

## 2020-09-11 NOTE — H&P (Signed)
OBSTETRIC ADMISSION HISTORY AND PHYSICAL  Maureen Cameron is a 29 y.o. female G2P1001 with IUP at 34w1dby L/21 presenting for scheduled repeat Cesarean. She reports +FMs, No LOF, no VB, no blurry vision, headaches or peripheral edema, and RUQ pain.  She plans on breast feeding. She is unsure for birth control.  [facility]  Dating: By L/21 --->  Estimated Date of Delivery: 09/17/20  Sono:  @[redacted]w[redacted]d , CWD, normal anatomy, cephalic presentation, 38850Y 93% EFW   Prenatal History/Complications:  -h/o Cesarean x1 (arrest of descent in s/o chorioamnionitis) -A2GDM (on metformin 5059mqd) -LGA infant (EFW 93% @[redacted]w[redacted]d )  Past Medical History: Past Medical History:  Diagnosis Date   Gestational diabetes    History of  gestational diabetes 11/18/2015   Medical history non-contributory     Past Surgical History: Past Surgical History:  Procedure Laterality Date   CESAREAN SECTION N/A 11/19/2015   Procedure: CESAREAN SECTION;  Surgeon: TaDonnamae JudeMD;  [facility];  Service: Obstetrics;  Laterality: N/A;    Obstetrical History: OB History    Gravida  2   Para  1   Term  1   Preterm  0   AB  0   Living  1     SAB  0   TAB  0   Ectopic  0   Multiple  0   Live Births  1           Social History Social History   Socioeconomic History   Marital status: Married    Spouse name: Not on file   Number of children: Not on file   Years of education: Not on file   Highest education level: Not on file  Occupational History   Not on file  Tobacco Use   Smoking status: Never Smoker   Smokeless tobacco: Never Used  Vaping Use   Vaping Use: Never used  Substance and Sexual Activity   Alcohol use: No   Drug use: No   Sexual activity: Not Currently  Other Topics Concern   Not on file  Social History Narrative   Not on file   Social Determinants of Health   Financial Resource Strain:    Difficulty of Paying Living  Expenses: Not on file  Food Insecurity: No Food Insecurity   Worried About Running Out of Food in the Last Year: Never true   [place] the Last Year: Never true  Transportation Needs: No Transportation Needs   Lack of Transportation (Medical): No   Lack of Transportation (Non-Medical): No  Physical Activity:    Days of Exercise per Week: Not on file   Minutes of Exercise per Session: Not on file  Stress:    Feeling of Stress : Not on file  Social Connections:    Frequency of Communication with Friends and Family: Not on file   Frequency of Social Gatherings with Friends and Family: Not on file   Attends Religious Services: Not on file   Active Member of Clubs or Organizations: Not on file   Attends ClArchivisteetings: Not on file   Marital Status: Not on file    Family History: Family History  Problem Relation Age of Onset   Hypertension Mother    Diabetes Mother    Hypertension Father     Allergies: No Known Allergies  Medications Prior to Admission  Medication Sig Dispense Refill Last Dose   aspirin 81 MG EC  tablet Take 81 mg by mouth daily. Swallow whole.      metFORMIN (GLUCOPHAGE) 500 MG tablet Take 1 tablet (500 mg total) by mouth daily with supper. (Patient not taking: Reported on 09/06/2020) 60 tablet 1    Prenatal Vit-Fe Fumarate-FA (PRENATAL PO) Take 1 tablet by mouth daily.       Accu-Chek Softclix Lancets lancets To check blood sugars 4 times a day (first thing in the morning and 2 hours after breakfast lunch and dinner) 100 each 12    Blood Glucose Monitoring Suppl (ACCU-CHEK GUIDE) w/Device KIT See admin instructions.      Blood Pressure Monitoring (BLOOD PRESSURE KIT) DEVI 1 Device by Does not apply route daily. ICD 10: Z34.00 (Patient not taking: Reported on 09/02/2020) 1 each 0    glucose blood (ACCU-CHEK GUIDE) test strip To check blood sugars 4 times a day (first thing in the morning and 2 hours after breakfast,  lunch and dinner) 100 each 12      Review of Systems   All systems reviewed and negative except as stated in HPI  Last menstrual period 12/12/2019, unknown if currently breastfeeding. General appearance: alert, cooperative and appears stated age Lungs: normal WOB Heart: regular rate and rhythm Abdomen: soft, non-tender Extremities: no sign of DVT Presentation: cephalic Fetal monitoring: FHT 152 on doppler     Prenatal labs: ABO, Rh: --/--/AB POS (11/24 1050) Antibody: NEG (11/24 1050) Rubella: 1.02 (06/30 1157) RPR: NON REACTIVE (11/24 1100)  HBsAg: Negative (06/30 1157)  HIV: Non Reactive (09/16 0913)  GBS: Negative/-- (11/11 1048)  1 hr Glucola 74/206/181 Genetic screening wnl Anatomy US wnl  Prenatal Transfer Tool  Maternal Diabetes: Yes:  Diabetes Type:  Insulin/Medication controlled (metformin 544m qd) Genetic Screening: Normal Maternal Ultrasounds/Referrals: Normal except for LGA infant Fetal Ultrasounds or other Referrals:  Referred to Materal Fetal Medicine  Maternal Substance Abuse:  No Significant Maternal Medications:  Meds include: Other:  metformin Significant Maternal Lab Results: Group B Strep negative  No results found for this or any previous visit (from the past 24 hour(s)).  Patient Active Problem List   Diagnosis Date Noted   LGA (large for gestational age) fetus affecting management of mother 08/15/2020   Gestational diabetes mellitus (GDM) controlled on oral hypoglycemic drug, antepartum 07/02/2020   Alpha thalassemia silent carrier 06/04/2020   History of cesarean section 05/12/2020   Supervision of high risk pregnancy, antepartum 04/14/2020   Maternal varicella, non-immune 10/04/2015    Assessment/Plan:  Maureen Cameron a 29y.o. G2P1001 at 39w1dere for scheduled repeat Cesarean.  #Scheduled Repeat Cesarean   h/o Cesarean x1: Pt previously had Cesarean in 2017 secondary to arrest of descent. The risks of cesarean section were  discussed with the patient including but were not limited to: bleeding which may require transfusion or reoperation; infection which may require antibiotics; injury to bowel, bladder, ureters or other surrounding organs; injury to the fetus; need for additional procedures including hysterectomy in the event of a life-threatening hemorrhage; placental abnormalities wth subsequent pregnancies, incisional problems, thromboembolic phenomenon and other postoperative/anesthesia complications.  The patient concurred with the proposed plan, giving informed written consent for the procedure.  Patient has been NPO since yesterday; she will remain NPO for procedure. Anesthesia and OR aware.  Preoperative prophylactic antibiotics (ancef 2g) and SCDs ordered on call to the OR.  To OR when ready.  #Pain: Per anesthesia #FWB: FHT 152 on dopplers #ID: GBS negative; ancef 2g to OR #MOF: breast #MOC: unsure #Circ:  n/a #A2GDM: Plan for FBG on POD#1. Plan for repeat 2h gtt in 6-8 weeks postpartum.  Randa Ngo, MD  09/11/2020, 7:51 AM

## 2020-09-11 NOTE — Discharge Instructions (Signed)

## 2020-09-11 NOTE — Progress Notes (Signed)
Hypoglycemic Event  CBG: 63  Treatment: 4 oz juice/soda  Symptoms: None  Follow-up CBG: Time:1255 CBG Result:75  Possible Reasons for Event: Inadequate meal intake and Other: GDM repeat C/S  Comments/MD notified:Houser, MD    Yisroel Ramming

## 2020-09-11 NOTE — Lactation Note (Addendum)
This note was copied from a baby's chart. Lactation Consultation Note  Patient Name: Maureen Kiffany Schelling [id number] Date: 09/11/2020 Reason for consult: Initial assessment;Term;Difficult latch;Mother's request P2, 8 hour term female infant. Infant had 3 stools and one void, dad changed a stool that RN document in the computer.  Per mom, she has DEBP at home. Per mom, she is frustrated her 1st child latched without difficulty and Breastfeed well and  infant is hungry but will not sustain latch. Her 1st child is 18 years old. LC explained each infant is different as well as the breastfeeding experience, infant is learning how to breastfeed. LC discussed LEAD with parents, mom agreeable to supplement infant at the breast with using 47F [place] mom did not want donor breast milk due to her religious beliefs ( Muslim). LC demonstrated to dad how to assist mom using the 5 F [place], mom latched infant on her right breast using the football hold, infant latched with formula supplement at the breast, infant breastfeed sustaining latch for 20 minutes. Mom felt better now that infant is  sustaining latch, LC encourage mom to continue latching infant in future try it without 47F [place]. Mom knows to BF infant according to cues, 8 to 12+ times within 24 hours, STS. Mom understands to use DEBP  every 3 hours for 15 minutes on initial setting, and any EBM she will give to infant first before using the formula. Mom shown how to use DEBP & how to disassemble, clean, & reassemble parts. Mom knows to call RN or LC if she needs further assistance with latching infant at the breast. LC suggested mom attend the [facility] Breastfeeding Support Group ( free)  when discharged from the hospital.  Mom made aware of O/P services, breastfeeding support groups, community resources, and our phone # for post-discharge questions.   Maternal Data Formula Feeding for Exclusion: No Has patient been taught Hand Expression?: Yes Does  the patient have breastfeeding experience prior to this delivery?: Yes  Feeding Feeding Type: Breast Milk with Formula added  LATCH Score Latch: Repeated attempts needed to sustain latch, nipple held in mouth throughout feeding, stimulation needed to elicit sucking reflex.  Audible Swallowing: Spontaneous and intermittent  Type of Nipple: Everted at rest and after stimulation  Comfort (Breast/Nipple): Soft / non-tender  Hold (Positioning): Assistance needed to correctly position infant at breast and maintain latch.  LATCH Score: 8  Interventions Interventions: Breast feeding basics reviewed;Assisted with latch;Breast compression;Skin to skin;Adjust position;Breast massage;Support pillows;Hand pump;DEBP;Position options;Hand express;Expressed milk  Lactation Tools Discussed/Used Tools: 47F feeding tube / Syringe;Pump Breast pump type: Double-Electric Breast Pump WIC Program: No Pump Review: Setup, frequency, and cleaning;Milk Storage Initiated by:: Danelle Earthly, IBCLC   Consult Status Consult Status: Follow-up Date: 09/12/20 Follow-up type: In-patient    Danelle Earthly 09/11/2020, 7:11 PM

## 2020-09-11 NOTE — Transfer of Care (Signed)
Immediate Anesthesia Transfer of Care Note  Patient: Maureen Cameron  Procedure(s) Performed: REPEAT CESAREAN SECTION (N/A )  Patient Location: PACU  Anesthesia Type:Spinal  Level of Consciousness: awake, alert  and oriented  Airway & Oxygen Therapy: Patient Spontanous Breathing  Post-op Assessment: Report given to RN and Post -op Vital signs reviewed and stable  Post vital signs: Reviewed and stable  Last Vitals:  Vitals Value Taken Time  BP 102/61 09/11/20 1115  Temp 36.6 C 09/11/20 1115  Pulse 65 09/11/20 1115  Resp 18 09/11/20 1115  SpO2      Last Pain:  Vitals:   09/11/20 1115  TempSrc: Oral  PainSc:          Complications: No complications documented.

## 2020-09-11 NOTE — Anesthesia Procedure Notes (Signed)
Spinal  Patient location during procedure: OB Start time: 09/11/2020 9:50 AM End time: 09/11/2020 9:54 AM Staffing Performed: anesthesiologist  Anesthesiologist: Trevor Iha, MD Preanesthetic Checklist Completed: patient identified, IV checked, risks and benefits discussed, surgical consent, monitors and equipment checked, pre-op evaluation and timeout performed Spinal Block Patient position: sitting Prep: DuraPrep and site prepped and draped Patient monitoring: heart rate, cardiac monitor, continuous pulse ox and blood pressure Approach: midline Location: L3-4 Injection technique: single-shot Needle Needle type: Pencan  Needle gauge: 24 G Needle length: 10 cm Needle insertion depth: 5 cm Assessment Sensory level: T4 Additional Notes 1 Attempt (s). Pt tolerated procedure well.

## 2020-09-11 NOTE — Discharge Summary (Signed)
Postpartum Discharge Summary    Patient Name: Maureen Cameron DOB: 03-22-1991 MRN: [id number]  Date of admission: 09/11/2020 Delivery date:09/11/2020  Delivering provider: Verita Schneiders A  Date of discharge: 09/13/2020  Admitting diagnosis: S/P cesarean section [Z98.891] Intrauterine pregnancy: [redacted]w[redacted]d    Secondary diagnosis:  Principal Problem:   Cesarean delivery delivered Active Problems:   Supervision of high risk pregnancy, antepartum   S/P cesarean section   Alpha thalassemia silent carrier   Gestational diabetes mellitus (GDM) controlled on oral hypoglycemic drug, antepartum   LGA (large for gestational age) fetus affecting management of mother  Additional problems: as noted above   Discharge diagnosis: Repeat Cesarean delivery delivered                                          Post partum procedures:None Augmentation: N/A Complications: None  Hospital course: Sceduled C/S   29y.o. yo G2P1001 at 348w1das admitted to the hospital 09/11/2020 for scheduled cesarean section with the following indication:Elective Repeat Cesarean in s/o LGA infant.Delivery details are as follows:  Membrane Rupture Time/Date: 10:18 AM ,09/11/2020   Delivery Method:C-Section, Low Transverse  Details of operation can be found in separate operative note.  Patient had an uncomplicated postpartum course.  She is ambulating, tolerating a regular diet, passing flatus, and urinating well. Patient is discharged home in stable condition on  09/13/20        Newborn Data: Birth date:09/11/2020  Birth time:10:18 AM  Gender:Female  Living status:Living  Apgars:8 ,8  Weight:8 lb 2.3 oz (3.695 kg) 3895g (8lbs 2.3oz)  Magnesium Sulfate received: No BMZ received: No Rhophylac:N/A MMR:N/A T-DaP:Given prenatally Flu: Yes Transfusion: None  Physical exam  Vitals:   09/11/20 2130 09/12/20 0510 09/12/20 2003 09/13/20 0505  BP:  113/69 117/71 111/78  Pulse:  67 84 72  Resp: _0 Temp: 98.1 F  (36.7 C) 97.8 F (36.6 C) 98 F (36.7 C) 98.1 F (36.7 C)  TempSrc: Oral Oral Oral Oral  SpO2: 97% 98% 99% 99%  Weight:      Height:       General: alert, cooperative and no distress Lochia: appropriate Uterine Fundus: firm Incision: Healing well with no significant drainage DVT Evaluation: No evidence of DVT seen on physical exam. Labs: Lab Results  Component Value Date   WBC 7.4 09/12/2020   HGB 11.4 (L) 09/12/2020   HCT 34.9 (L) 09/12/2020   MCV 80.4 09/12/2020   PLT 170 09/12/2020   CMP Latest Ref Rng & Units 09/12/2020  Glucose 70 - 99 mg/dL 74  BUN 6 - 20 mg/dL 5(L)  Creatinine 0.44 - 1.00 mg/dL 0.59  Sodium 135 - 145 mmol/L 135  Potassium 3.5 - 5.1 mmol/L 4.0  Chloride 98 - 111 mmol/L 104  CO2 22 - 32 mmol/L 22  Calcium 8.9 - 10.3 mg/dL 8.6(L)  Total Protein 6.5 - 8.1 g/dL 5.0(L)  Total Bilirubin 0.3 - 1.2 mg/dL 0.4  Alkaline Phos 38 - 126 U/L 107  AST 15 - 41 U/L 27  ALT 0 - 44 U/L 27   Edinburgh Score: Edinburgh Postnatal Depression Scale Screening Tool 09/12/2020  I have been able to laugh and see the funny side of things. 0  I have looked forward with enjoyment to things. 0  I have blamed myself unnecessarily when things went wrong. 1  I have been anxious or worried  for no good reason. 1  I have felt scared or panicky for no good reason. 1  Things have been getting on top of me. 1  I have been so unhappy that I have had difficulty sleeping. 1  I have felt sad or miserable. 1  I have been so unhappy that I have been crying. 0  The thought of harming myself has occurred to me. 0  Edinburgh Postnatal Depression Scale Total 6     After visit meds:  Allergies as of 09/13/2020   No Known Allergies     Medication List    STOP taking these medications   Accu-Chek Guide test strip Generic drug: glucose blood   Accu-Chek Guide w/Device Kit   Accu-Chek Softclix Lancets lancets   aspirin 81 MG EC tablet   Blood Pressure Kit Devi   metFORMIN 500  MG tablet Commonly known as: GLUCOPHAGE     TAKE these medications   ibuprofen 600 MG tablet Commonly known as: ADVIL Take 1 tablet (600 mg total) by mouth every 6 (six) hours as needed.   oxyCODONE-acetaminophen 5-325 MG tablet Commonly known as: PERCOCET/ROXICET Take 2 tablets by mouth every 4 (four) hours as needed for severe pain ((when tolerating fluids)).   PRENATAL PO Take 1 tablet by mouth daily.        Discharge home in stable condition Infant Feeding: Breast Infant Disposition:home with mother Discharge instruction: per After Visit Summary and Postpartum booklet. Activity: Advance as tolerated. Pelvic rest for 6 weeks.  Diet: routine diet Future Appointments:No future appointments. Follow up Visit: Message sent to [facility] on 09/11/20  Please schedule this patient for a In person postpartum visit in 4 weeks with the following provider: Any provider. Additional Postpartum F/U:Incision check 1 week, 2h gtt in 6-8 weeks postpartum High risk pregnancy complicated by: now h/o Cesarean x2, silent alpha thal carrier, A2GDM (on metformin in pregnancy) Delivery mode:  C-Section, Low Transverse  Anticipated Birth Control:  NFP  Gaylan Gerold, CNM, MSN, [facility] 09/13/20 10:46 AM

## 2020-09-12 LAB — CBC
HCT: 34.9 % — ABNORMAL LOW (ref 36.0–46.0)
Hemoglobin: 11.4 g/dL — ABNORMAL LOW (ref 12.0–15.0)
MCH: 26.3 pg (ref 26.0–34.0)
MCHC: 32.7 g/dL (ref 30.0–36.0)
MCV: 80.4 fL (ref 80.0–100.0)
Platelets: 170 10*3/uL (ref 150–400)
RBC: 4.34 MIL/uL (ref 3.87–5.11)
RDW: 16.1 % — ABNORMAL HIGH (ref 11.5–15.5)
WBC: 7.4 10*3/uL (ref 4.0–10.5)
nRBC: 0 % (ref 0.0–0.2)

## 2020-09-12 LAB — COMPREHENSIVE METABOLIC PANEL
ALT: 27 U/L (ref 0–44)
AST: 27 U/L (ref 15–41)
Albumin: 2.1 g/dL — ABNORMAL LOW (ref 3.5–5.0)
Alkaline Phosphatase: 107 U/L (ref 38–126)
Anion gap: 9 (ref 5–15)
BUN: 5 mg/dL — ABNORMAL LOW (ref 6–20)
CO2: 22 mmol/L (ref 22–32)
Calcium: 8.6 mg/dL — ABNORMAL LOW (ref 8.9–10.3)
Chloride: 104 mmol/L (ref 98–111)
Creatinine, Ser: 0.59 mg/dL (ref 0.44–1.00)
GFR, Estimated: >60 mL/min (ref >60)
Glucose, Bld: 74 mg/dL (ref 70–99)
Potassium: 4 mmol/L (ref 3.5–5.1)
Sodium: 135 mmol/L (ref 135–145)
Total Bilirubin: 0.4 mg/dL (ref 0.3–1.2)
Total Protein: 5 g/dL — ABNORMAL LOW (ref 6.5–8.1)

## 2020-09-12 LAB — GLUCOSE, CAPILLARY
Glucose-Capillary: 68 mg/dL — ABNORMAL LOW (ref 70–99)
Glucose-Capillary: 99 mg/dL (ref 70–99)

## 2020-09-12 MED ORDER — FERROUS SULFATE 325 (65 FE) MG PO TABS: 325.0000 mg | ORAL_TABLET | ORAL | Status: DC

## 2020-09-12 NOTE — Progress Notes (Signed)
RN checked patient's blood sugar per order and the result was 68. Patient given 4oz of apple juice and blood sugar rechecked in 15 minutes, the result was 99.

## 2020-09-12 NOTE — Lactation Note (Signed)
This note was copied from a baby's chart. Lactation Consultation Note  Patient Name: Maureen Cameron [id number] Date: 09/12/2020 Reason for consult: Follow-up assessment;Term;Infant weight loss  Visited with mom of a 12 hours old FT female, she's a P2 and doing both breast and formula feeding. Mom has been supplementing with Similac 20 calorie formula through the 5 F tube at the breast yesterday but she started doing bottles with extra slow flow nipples today.   Mom pumping when entering the room, praised her for her efforts. She is concerned because her milk is not coming in yet. Explained to mom that the purpose of pumping this early on is mainly for breast stimulation and not to get volume; she's still anxious about her supply because she keeps comparing it with her first born, according to mom her milk came in within a day with her first child. Mom only pumped 3 times today, encouraged her to do so every 3 hours to bring her milk to volume sooner  Mom voiced her nipples are getting sore, assisted mom with hand expression but unable to get any colostrum yet. LC requested coconut oil to her RN, she brought it to the room right away. Applied coconut oil on the nipple/areola complex and resume pumping, instructed mom to apply coconut oil every time prior pumping.  Offered assistance with latch but mom politely declined, she told LC baby just fed formula, she was asleep on her bassinet. Asked mom to call for assistance when needed. Per mom, she's been feeding at the breast, but she hasn't been using the NS # 20 consistently, she told LC she didn't use it on the last feeding and baby was able to latch on without it. Reviewed normal newborn behavior, feeding cues, cluster feeding, size of baby's stomach, pumping schedule and lactogenesis II.  Feeding plan  1. Encouraged mom to feed baby STS 8-12 times/24 hours or sooner if feeding cues are present. She'll use NS # 20 PRN 2. Pumping every 3 hours was  also encouraged 3. Parents will continue supplementing baby with Similac 20 calorie formula, using either the 5 F feeding tube or a bottle with an extra-slow flow nipple  FOB present and supportive, but not willing to do STS with baby; family is Muslim. Parents reported all questions and concerns were answered, they're both aware of LC OP services and will call PRN.   Maternal Data    Feeding Feeding Type: Formula  LATCH Score                   Interventions Interventions: Breast feeding basics reviewed;Breast massage;Hand express;Breast compression;DEBP;Coconut oil  Lactation Tools Discussed/Used Tools: Pump;Coconut oil Breast pump type: Double-Electric Breast Pump   Consult Status Consult Status: Follow-up Date: 09/13/20 Follow-up type: In-patient    Maureen Cameron Venetia Constable 09/12/2020, 10:11 PM

## 2020-09-12 NOTE — Progress Notes (Signed)
POSTPARTUM PROGRESS NOTE  POD #1  Subjective:  Maureen Cameron is a 29 y.o. [id number] s/p rLTCS at [redacted]w[redacted]d.  She reports she doing well. No acute events overnight. She reports she is doing well. She denies any problems with ambulating, voiding or po intake. Denies nausea or vomiting. She has passed flatus. Pain is well controlled.  Lochia is decreasing.  Objective: Blood pressure 113/69, pulse 67, temperature 97.8 F (36.6 C), temperature source Oral, resp. rate 18, height 5\' 4"  (1.626 m), weight 74.8 kg, last menstrual period 12/12/2019, SpO2 98 %, unknown if currently breastfeeding.  Physical Exam:  General: alert, cooperative and no distress Chest: no respiratory distress Heart:regular rate, distal pulses intact Abdomen: soft, nontender,  Uterine Fundus: firm, appropriately tender DVT Evaluation: No calf swelling or tenderness Extremities: No LE edema Skin: warm, dry; incision w/ honeycomb and pressure dressing in place. Dressing is cleaning without drainage  Recent Labs    09/12/20 0514  HGB 11.4*  HCT 34.9*    Assessment/Plan: Arisbel Maione is a 29 y.o. 38 s/p scheduled rLTCS at [redacted]w[redacted]d.  POD#1 - Doing welll; pain well controlled. H/H appropriate  Routine postpartum care  OOB, ambulated  Lovenox for VTE prophylaxis Anemia: asymptomatic Start po ferrous sulfate BID Contraception: Declines Feeding: Breast  Dispo: Plan for discharge home tomorrow.   LOS: 1 day   [redacted]w[redacted]d, DO 09/12/2020, 9:19 AM PGY-2, [facility]

## 2020-09-13 ENCOUNTER — Ambulatory Visit: Payer: Medicaid Other

## 2020-09-13 LAB — GLUCOSE, CAPILLARY: Glucose-Capillary: 81 mg/dL (ref 70–99)

## 2020-09-13 MED ORDER — OXYCODONE-ACETAMINOPHEN 5-325 MG PO TABS: 2.0000 | ORAL_TABLET | ORAL | 0 refills | Status: DC | PRN

## 2020-09-13 MED ORDER — IBUPROFEN 600 MG PO TABS: 600.0000 mg | ORAL_TABLET | Freq: Four times a day (QID) | ORAL | 3 refills | Status: DC | PRN

## 2020-09-13 NOTE — Progress Notes (Signed)
Subjective: Postpartum Day 2: Cesarean Delivery Patient reports no issues this morning and is ready to go home. Patient has tried breastfeeding, but is struggling with milk production. Lactation services has been aiding in the process. Lochia is minimal. Patients has not had a BM yet, tolerating PO intake well. Patient does not want BC. Pain is well tolerated. Patient has been up ambulating.   Objective: Vital signs in last 24 hours: Temp:  [98 F (36.7 C)-98.1 F (36.7 C)] 98.1 F (36.7 C) (11/29 0505) Pulse Rate:  [72-84] 72 (11/29 0505) Resp:  [17-18] 18 (11/29 0505) BP: (111-117)/(71-78) 111/78 (11/29 0505) SpO2:  [99 %] 99 % (11/29 0505)  Physical Exam:  General: alert, appears stated age and no distress Lochia: appropriate Uterine Fundus: firm Incision: healing well - DVT Evaluation: No evidence of DVT seen on physical exam. - Negative Homan's sign. - No cords or calf tenderness. - No significant calf/ankle edema.  Recent Labs    09/12/20 0514  HGB 11.4*  HCT 34.9*    Assessment/Plan: Status post day 2, Cesarean section. Doing well postoperatively.  Discharge home with standard precautions and return to clinic in 4-6 weeks.  Colman Cater PA-S 09/13/2020, 8:01 AM

## 2020-09-13 NOTE — Lactation Note (Signed)
This note was copied from a baby's chart. Lactation Consultation Note  Patient Name: Girl Iolani Twilley [id number] Date: 09/13/2020 Reason for consult: Follow-up assessment;Primapara;1st time breastfeeding;Term;Infant weight loss  Baby is 50 hours, @43  hours old - Skin Bili  - 8.2  As LC entered the room baby awake and crying.  LC offered to check and change the diaper and it was wet.  Baby so fussy, LC recommended feeding her an appetizer from a  Bottle and then latching. Baby calmer after appetizer, and latched easily. LC showed dad how to insert the 93F SNS in the side of the mouth after the baby is latched and baby latched with depth and fed for 20 mins with increased swallows and per mom comfortable the entire feeding.  LC assessed both breast and nipples at feeding. Right nipple to sore to latch and LC recommended giving the right nipple a chance to heal by pumping due to abrasions / intact.  LC reviewed hand expressing prior to latch and mom was surprised when she got several drops. Mom has short shaft nipples and some areola edema.  LC reviewed sore nipple and engorgement prevention and tx.  LC provided comfort gels x 6 days after feedings or pumping, alternating with shells while awake.  LC reviewed the use of the hand pump and per mom has a DEBP at home.  LC recommended extra post pumping to bring the milk in 10 -15 mins .  Once milk comes in to pump only when needed.  Mom has the [facility] brochure with resource numbers.     Maternal Data    Feeding Feeding Type: Breast Milk with Formula added Nipple Type: Slow - flow  LATCH Score Latch: Grasps breast easily, tongue down, lips flanged, rhythmical sucking.  Audible Swallowing: Spontaneous and intermittent  Type of Nipple: Everted at rest and after stimulation  Comfort (Breast/Nipple): Filling, red/small blisters or bruises, mild/mod discomfort  Hold (Positioning): Assistance needed to correctly position infant at breast and  maintain latch.  LATCH Score: 8  Interventions Interventions: Breast feeding basics reviewed;Assisted with latch;Skin to skin;Breast massage;Hand express;Pre-pump if needed;Reverse pressure;Breast compression;Adjust position;Support pillows;Position options;Expressed milk;Coconut oil;Shells;Comfort gels;Hand pump;DEBP  Lactation Tools Discussed/Used Tools: Shells;Pump;Flanges;Comfort gels;93F feeding tube / Syringe Flange Size: 24;27 Shell Type: Inverted Breast pump type: Manual;Double-Electric Breast Pump   Consult Status Consult Status: Complete Date: 09/13/20    09/15/20 09/13/2020, 12:35 PM

## 2020-09-14 ENCOUNTER — Encounter (HOSPITAL_COMMUNITY): Payer: Self-pay | Admitting: Obstetrics & Gynecology

## 2020-09-14 NOTE — Addendum Note (Signed)
Addendum  created 09/14/20 [id number] by Trevor Iha, MD   Intraprocedure Event deleted

## 2020-09-20 ENCOUNTER — Other Ambulatory Visit: Payer: Self-pay

## 2020-09-20 ENCOUNTER — Encounter: Payer: Self-pay | Admitting: Family Medicine

## 2020-09-20 ENCOUNTER — Ambulatory Visit (INDEPENDENT_AMBULATORY_CARE_PROVIDER_SITE_OTHER): Payer: Medicaid Other | Admitting: General Practice

## 2020-09-20 VITALS — BP 128/84 | HR 86 | Ht 64.0 in | Wt 145.0 lb

## 2020-09-20 DIAGNOSIS — Z5189 Encounter for other specified aftercare: Secondary | ICD-10-CM

## 2020-09-20 NOTE — Progress Notes (Signed)
Patient presents to office today for wound check following repeat c-section on 11/27. Patient reports doing well since then. Honeycomb dressing was still in place. Dressing removed. Incision is clean, dry & intact- appears to be healing well. Wound care and signs & symptoms of infection reviewed with patient. Patient will follow up at scheduled pp visit on 12/27.  Chase Caller RN BSN 09/20/20

## 2020-10-11 ENCOUNTER — Encounter: Payer: Self-pay | Admitting: Obstetrics & Gynecology

## 2020-10-11 ENCOUNTER — Other Ambulatory Visit: Payer: Self-pay

## 2020-10-11 ENCOUNTER — Ambulatory Visit (INDEPENDENT_AMBULATORY_CARE_PROVIDER_SITE_OTHER): Payer: Medicaid Other | Admitting: Obstetrics & Gynecology

## 2020-10-11 DIAGNOSIS — Z8632 Personal history of gestational diabetes: Secondary | ICD-10-CM

## 2020-10-11 DIAGNOSIS — Z98891 History of uterine scar from previous surgery: Secondary | ICD-10-CM

## 2020-10-11 NOTE — Progress Notes (Signed)
Post Partum Visit Note  Maureen Cameron is a 29 y.o. G40P2002 female who presents for a postpartum visit. She is 4 weeks postpartum following a repeat cesarean section.  I have fully reviewed the prenatal and intrapartum course, had Metformin controlled GDM. The delivery was at 39.1 gestational weeks.  Anesthesia: spinal. Postpartum course has been uncomplicated. Baby is doing well. Baby is feeding by breast. Bleeding no bleeding. Bowel function is normal. Bladder function is normal. Patient is not sexually active. Contraception method is condoms. Postpartum depression screening: negative.  The pregnancy intention screening data noted above was reviewed. Potential methods of contraception were discussed. The patient elected to proceed with Female Condom.   Edinburgh Postnatal Depression Scale - 10/11/20 1041      Edinburgh Postnatal Depression Scale:  In the Past 7 Days   I have been able to laugh and see the funny side of things. 0    I have looked forward with enjoyment to things. 0    I have blamed myself unnecessarily when things went wrong. 0    I have been anxious or worried for no good reason. 0    I have felt scared or panicky for no good reason. 0    Things have been getting on top of me. 0    I have been so unhappy that I have had difficulty sleeping. 0    I have felt sad or miserable. 0    I have been so unhappy that I have been crying. 0    The thought of harming myself has occurred to me. 0    Edinburgh Postnatal Depression Scale Total 0          The following portions of the patient's history were reviewed and updated as appropriate: allergies, current medications, past family history, past medical history, past social history, past surgical history and problem list.  Review of Systems Pertinent items noted in HPI and remainder of comprehensive ROS otherwise negative.    Objective:  BP 111/77   Pulse 75   Ht 5\' 4"  (1.626 m)   Wt 140 lb (63.5 kg)   LMP 12/12/2019    Breastfeeding Yes   BMI 24.03 kg/m    General:  alert and no distress   Breasts:  inspection negative, no nipple discharge or bleeding, no masses or nodularity palpable  Lungs: Normal breath sounds, no difficulties  Heart:  regular rate noted  Abdomen: soft, non-tender; bowel sounds normal; no masses,  no organomegaly. Incision is well-healed, no C/D/I  Pelvic:  not evaluated        Assessment:    Normal postpartum exam. Pap smear not done at today's visit.   Plan:   Essential components of care per ACOG recommendations:  1.  Mood and well being: Patient with negative depression screening today. Reviewed local resources for support.  - Patient does not use tobacco or drugs  2. Infant care and feeding:  -Patient currently breastmilk feeding? Yes.  If breastmilk feeding discussed return to work and pumping. Reviewed importance of draining breast regularly to support lactation. -Social determinants of health (SDOH) reviewed in EPIC.   3. Sexuality, contraception and birth spacing - Patient does not want a pregnancy in the next year.  Unsure about number of children desired in future - Reviewed forms of contraception in tiered fashion. Patient desired condoms today.   - Discussed birth spacing of 18 months  4. Sleep and fatigue -Encouraged family/partner/community support of 4 hrs of uninterrupted sleep  to help with mood and fatigue  5. Physical Recovery  - Discussed patients delivery - Patient has urinary incontinence? No - Patient is safe to resume physical and sexual activity  6.  Health Maintenance - Last pap smear done 6/30/201 and was normal with negative HPV.  7. History of A2GDM - Scheduled for postpartum 2 hr GTT on 10/25/2020, will follow up results and manage accordingly.    Jaynie Collins, MD [facility] [employer], [facility]

## 2020-10-19 ENCOUNTER — Other Ambulatory Visit: Payer: Self-pay

## 2020-10-19 DIAGNOSIS — Z8632 Personal history of gestational diabetes: Secondary | ICD-10-CM

## 2020-10-25 ENCOUNTER — Other Ambulatory Visit: Payer: Medicaid Other

## 2021-02-14 IMAGING — US US MFM OB FOLLOW-UP
1 series · 14 of 28 positions shown · non-contrast
Comparison: none

[Series 1: us mfm ob follow-up · 31 acquisitions, 14 frames shown]
[im 2/31]
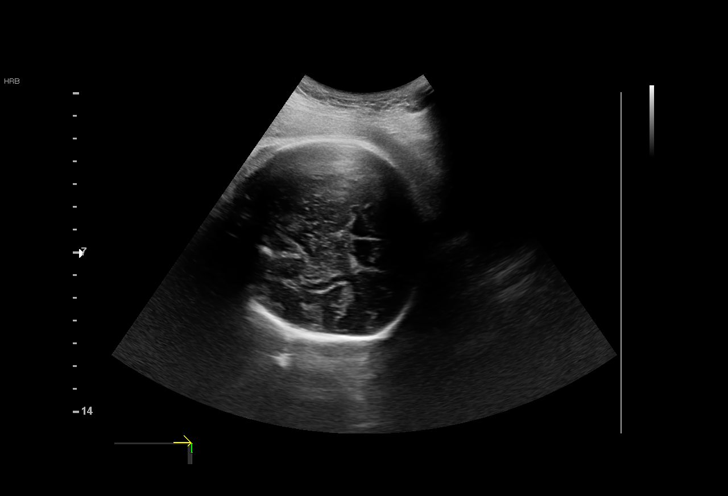
[im 4/31]
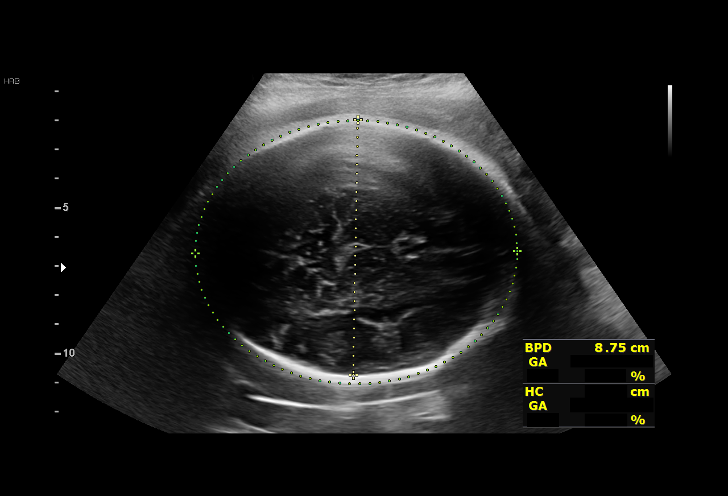
[im 6/31]
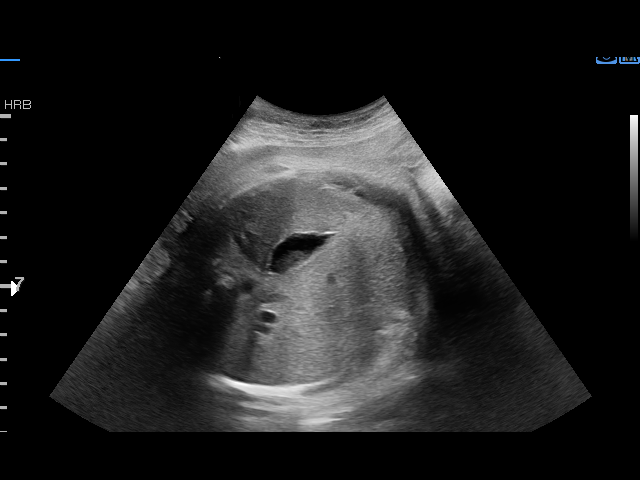
[im 8/31]
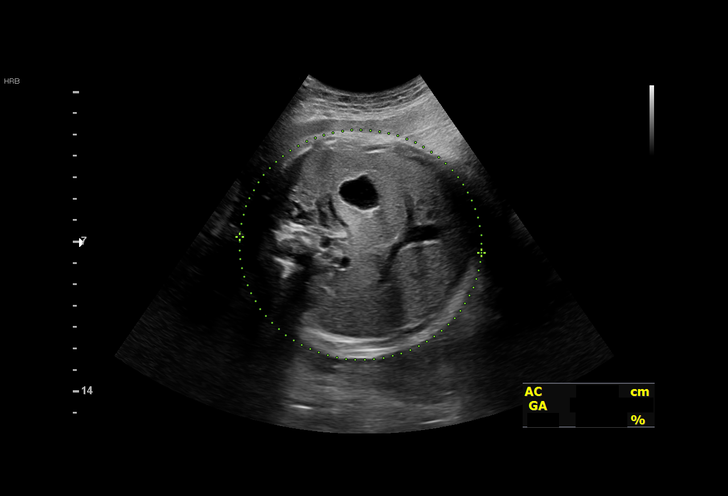
[im 11/31]
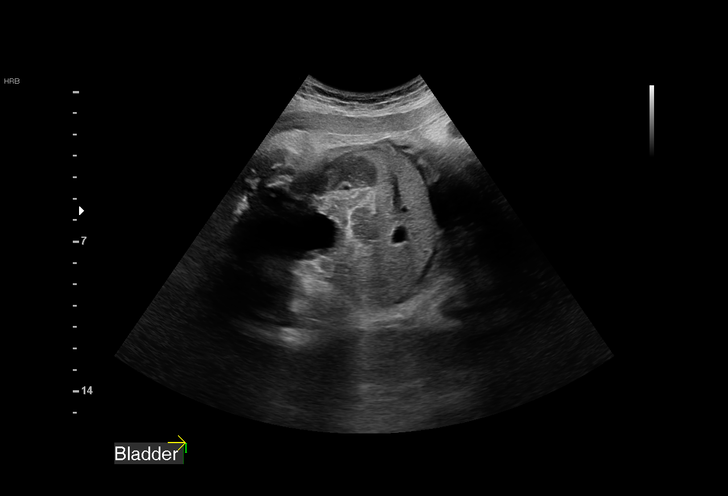
[im 13/31]
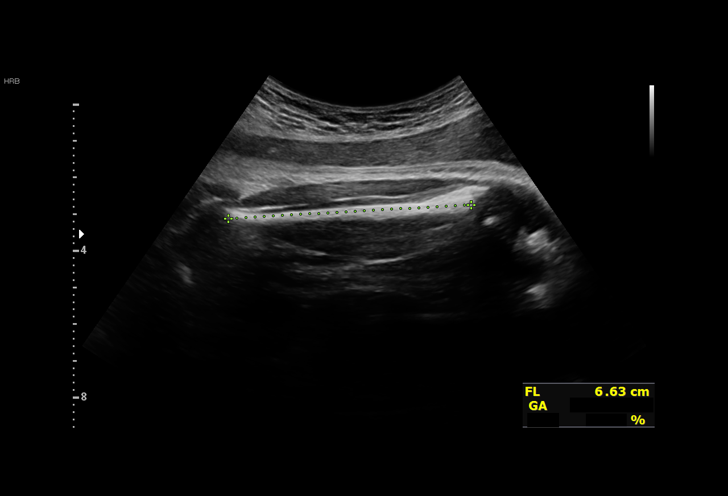
[im 15/31]
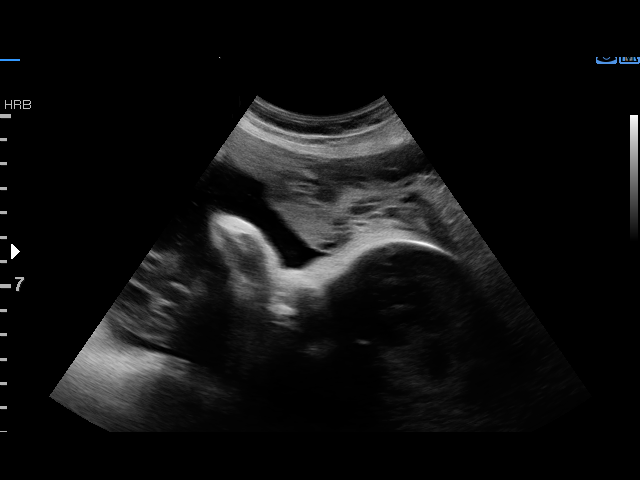
[im 17/31]
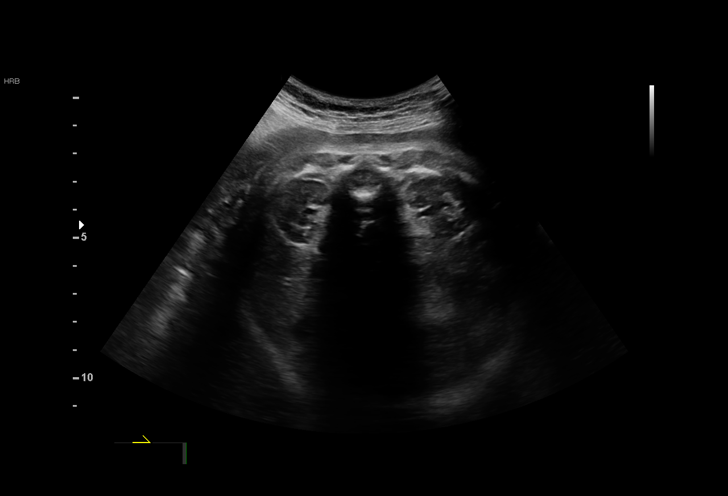
[im 19/31]
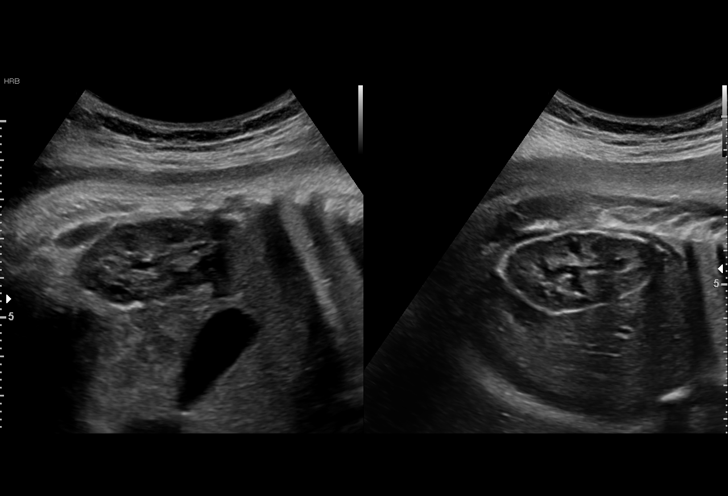
[im 22/31]
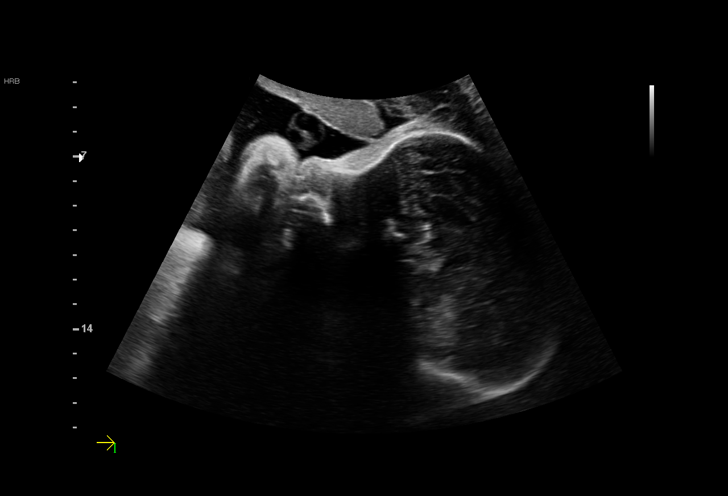
[im 24/31]
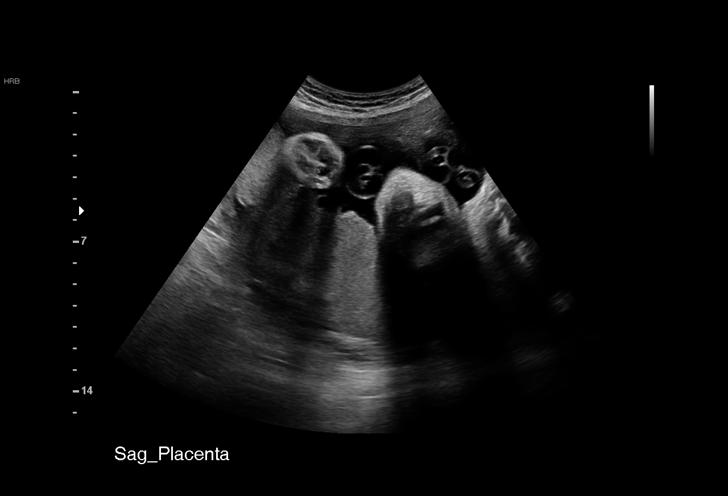
[im 26/31]
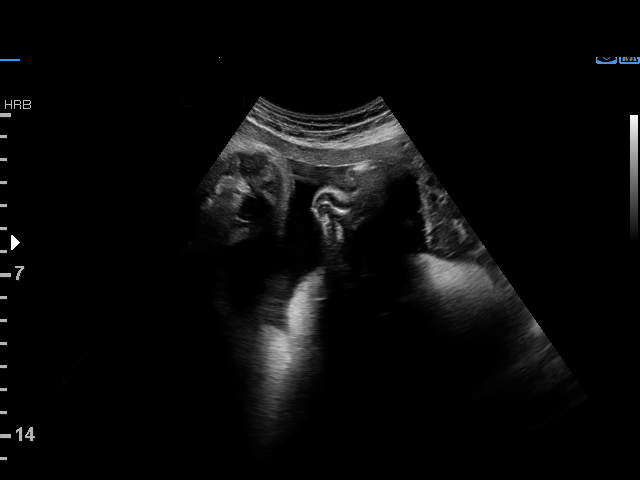
[im 28/31]
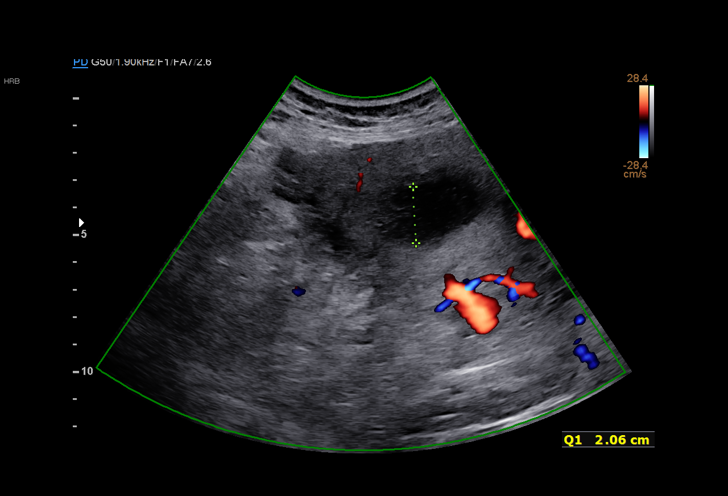
[im 31/31]
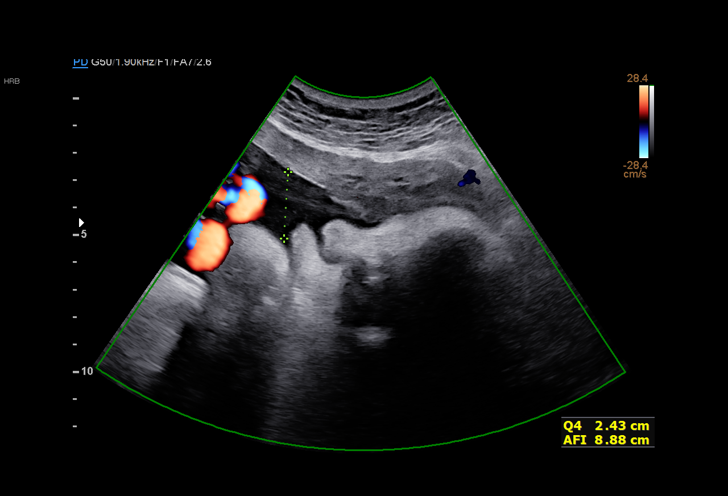

[14 of 28 positions shown; findings below may reference images not displayed]

Indications

 Gestational diabetes in pregnancy,
 controlled by oral hypoglycemic drugs
 Genetic carrier (silent carrier for Woody Jumper)VMU.X
 Poor obstetric history: Previous gestational
 diabetes
 History of cesarean delivery, currently
 pregnant
 35 weeks gestation of pregnancy
Vital Signs

                                                Height:        5'4"
Fetal Evaluation

 Num Of Fetuses:         1
 Fetal Heart Rate(bpm):  155
 Cardiac Activity:       Observed
 Presentation:           Cephalic
 Placenta:               Posterior
 P. Cord Insertion:      Previously Visualized

 Amniotic Fluid
 AFI FV:      Within normal limits

 AFI Sum(cm)     %Tile       Largest Pocket(cm)
 8.89            12
 RUQ(cm)       RLQ(cm)       LUQ(cm)        LLQ(cm)

Biophysical Evaluation

 Amniotic F.V:   Within normal limits       F. Tone:        Observed
 F. Movement:    Observed                   Score:          [DATE]
 F. Breathing:   Observed
Biometry

 BPD:      87.5  mm     G. Age:  35w 2d         62  %    CI:        78.58   %    70 - 86
                                                         FL/HC:      21.2   %    20.1 -
 HC:      312.2  mm     G. Age:  35w 0d         16  %    HC/AC:      0.89        0.93 -
 AC:      349.1  mm     G. Age:  38w 6d       > 99  %    FL/BPD:     75.8   %    71 - 87
 FL:       66.3  mm     G. Age:  34w 1d         21  %    FL/AC:      19.0   %    20 - 24

 Est. FW:    4194  gm    6 lb 12 oz      92  %
OB History

 Gravidity:    2         Term:   1
 Living:       1
Gestational Age

 LMP:           35w 0d        Date:  12/12/19                 EDD:   09/17/20
 U/S Today:     35w 6d                                        EDD:   09/11/20
 Best:          35w 0d     Det. By:  LMP  (12/12/19)          EDD:   09/17/20
Anatomy

 Cranium:               Appears normal         Aortic Arch:            Previously seen
 Cavum:                 Appears normal         Ductal Arch:            Previously seen
 Ventricles:            Appears normal         Diaphragm:              Appears normal
 Choroid Plexus:        Previously seen        Stomach:                Appears normal, left
                                                                       sided
 Cerebellum:            Previously seen        Abdomen:                Appears normal
 Posterior Fossa:       Previously seen        Abdominal Wall:         Previously seen
 Nuchal Fold:           Not applicable (>20    Cord Vessels:           Previously seen
                        wks GA)
 Face:                  Orbits and profile     Kidneys:                Appear normal
                        previously seen
 Lips:                  Previously seen        Bladder:                Appears normal
 Thoracic:              Appears normal         Spine:                  Previously seen
 Heart:                 Previously seen        Upper Extremities:      Previously seen
 RVOT:                  Previously seen        Lower Extremities:      Previously seen
 LVOT:                  Previously seen

 Other:  Fetus appears to be female. Heels and 5th digit previously visualized.
         Nasal bone previously visualized. Open hands previously visualized.
         3VV and 3VTV previously visualized.
Cervix Uterus Adnexa

 Cervix
 Not visualized (advanced GA >62wks)
Impression

 Follow up growth due to DUOPN on oral hypoglycemics
 Normal interval growth with measurements are larger than
 dates with an AC > 99th%
 There is good fetal movement and amniotic fluid volume
 Biophysical profile [DATE]
Recommendations

 Continue weekly testing
 Consider delivery between 37-39 weeks pending maternal
 glucose control and fetal status
 If not delivered by 38 weeks, consider repeat growth given
 today's EFW.

## 2021-03-06 IMAGING — US US FETAL BPP W/ NON-STRESS
1 series · 13 of 14 positions shown · non-contrast
Comparison: none

[Series 1: us fetal bpp w/ non-stress · 14 acquisitions, 13 frames shown]
[im 1/14]
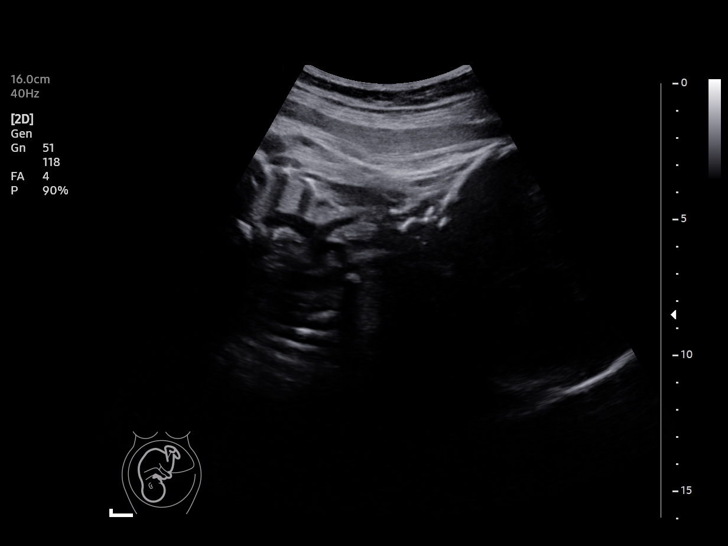
[im 2/14]
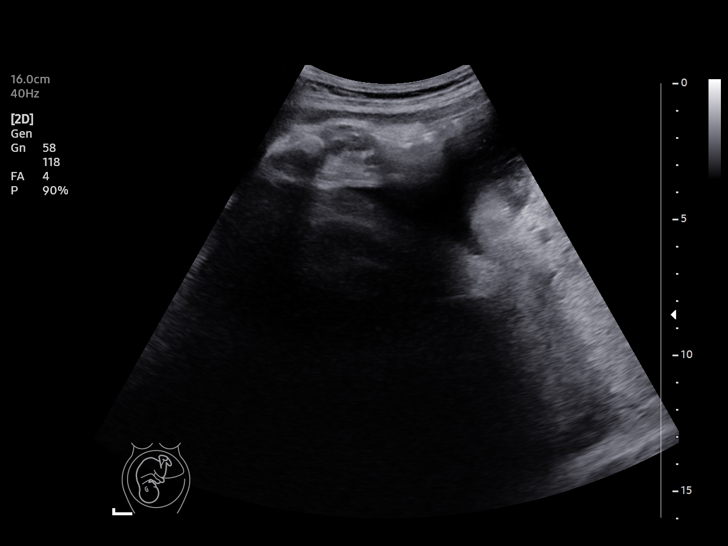
[im 3/14]
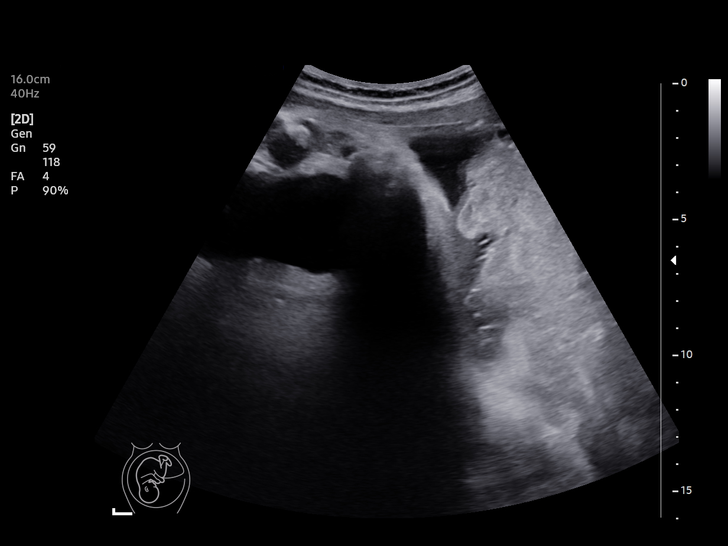
[im 4/14]
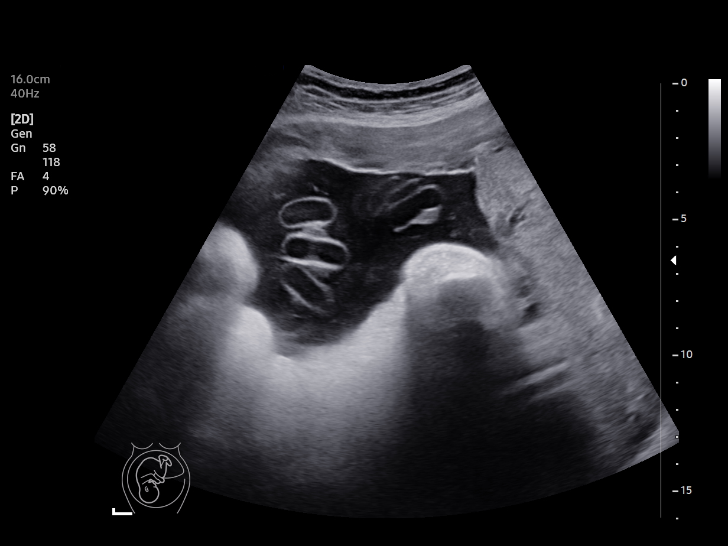
[im 5/14]
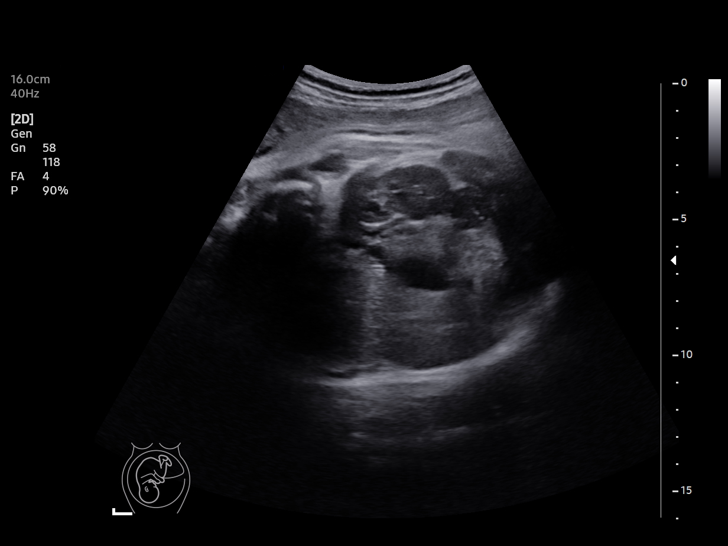
[im 6/14]
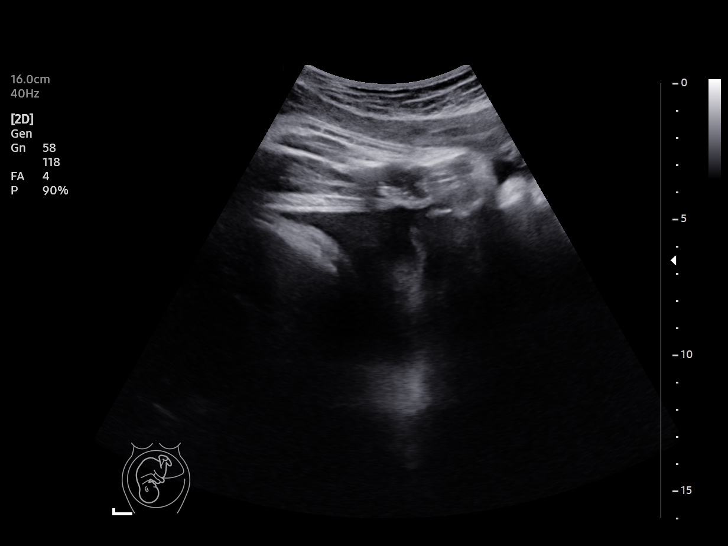
[im 8/14]
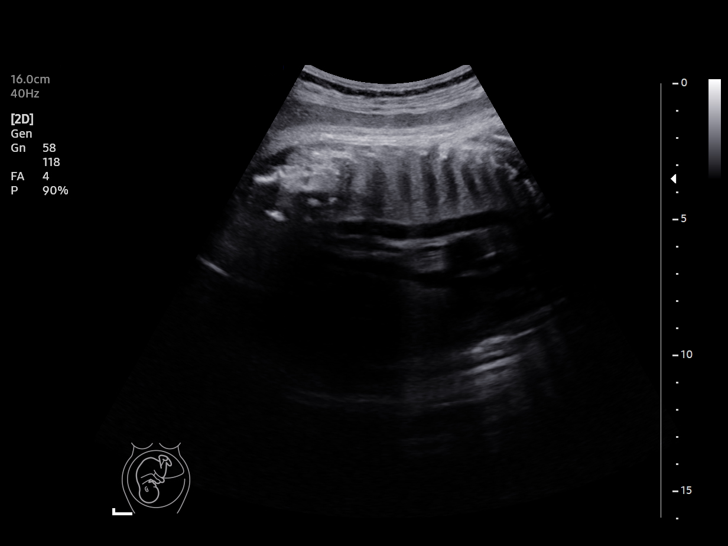
[im 9/14]
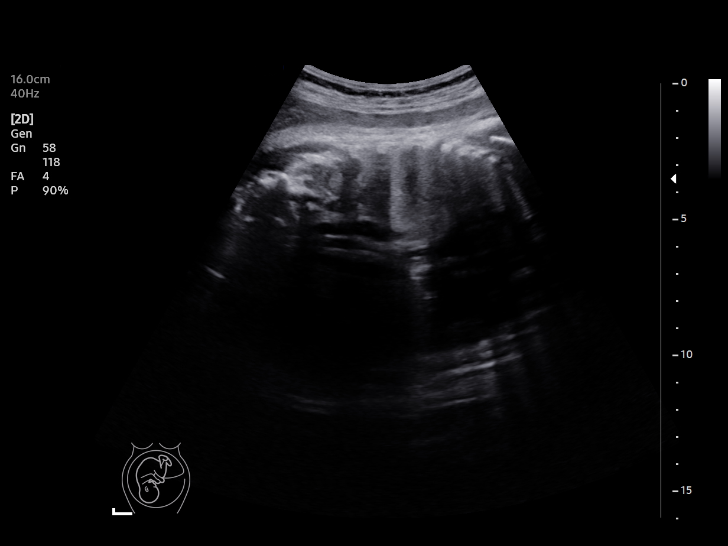
[im 10/14]
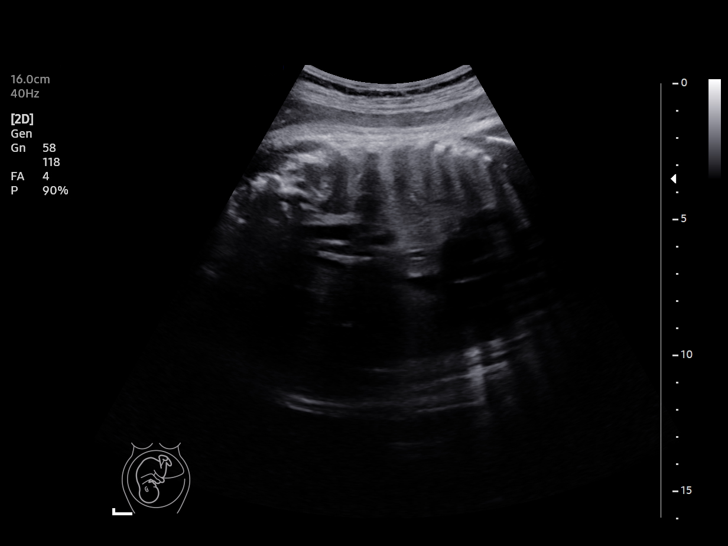
[im 11/14]
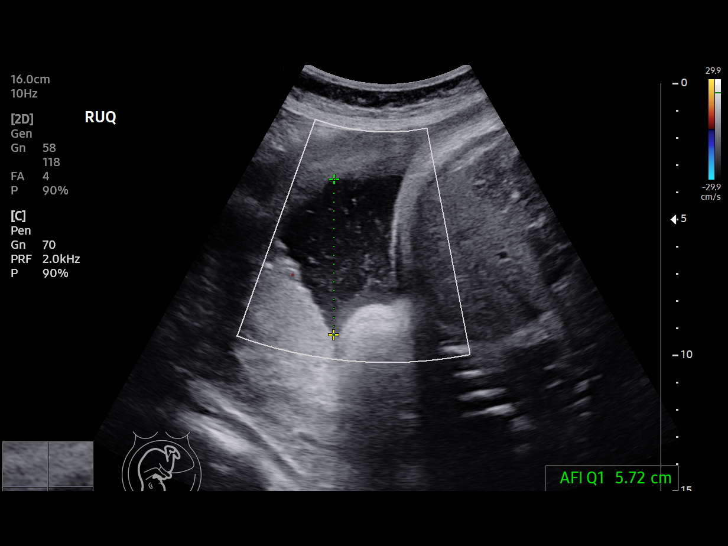
[im 12/14]
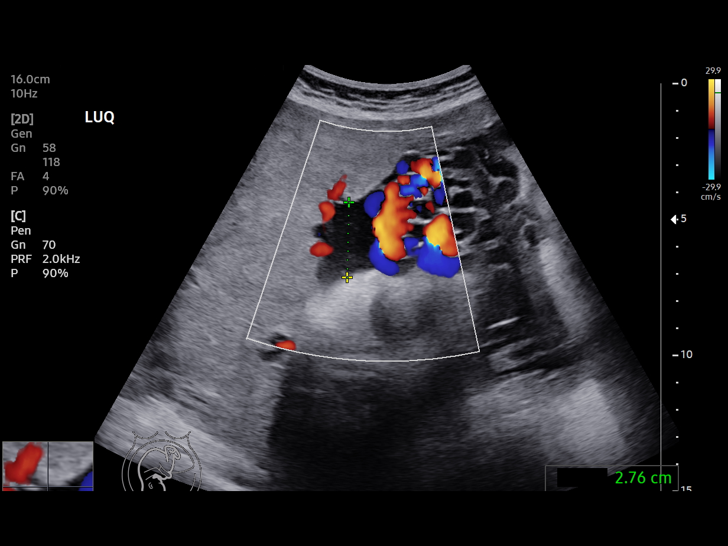
[im 13/14]
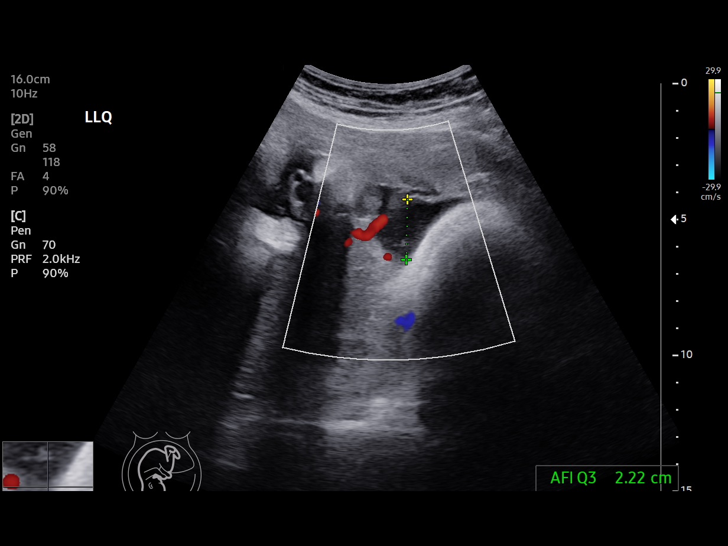
[im 14/14]
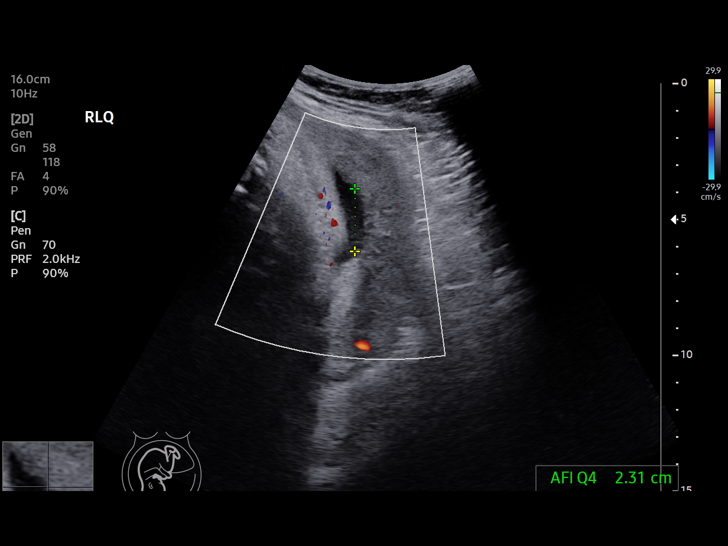

[13 of 14 positions shown; findings below may reference images not displayed]

[REDACTED]care at

 1  US FETAL BPP W/NONSTRESS              76818.4     JOHAL SAAB LENKA

Service(s) Provided

Indications

 37 weeks gestation of pregnancy
 Gestational diabetes in pregnancy,
 controlled by oral hypoglycemic drugs
Vital Signs

                                                Height:        5'4"
Fetal Evaluation

 Num Of Fetuses:         1
 Preg. Location:         Intrauterine
 Cardiac Activity:       Observed
 Presentation:           Cephalic

 Amniotic Fluid
 AFI FV:      Within normal limits

 AFI Sum(cm)     %Tile       Largest Pocket(cm)
 13              48

 RUQ(cm)       RLQ(cm)       LUQ(cm)        LLQ(cm)

Biophysical Evaluation

 Amniotic F.V:   Pocket => 2 cm             F. Tone:        Observed
 F. Movement:    Observed                   N.S.T:          Reactive
 F. Breathing:   Observed                   Score:          [DATE]
OB History

 Gravidity:    2         Term:   1
 Living:       1
Gestational Age

 LMP:           37w 6d        Date:  12/12/19                 EDD:   09/17/20
 Best:          37w 6d     Det. By:  LMP  (12/12/19)          EDD:   09/17/20
Impression

 Viable fetus in cephalic presentation. BPP [DATE]
Recommendations

 Routine prenatal care. Patient scheduled for delivery next
 week
                Pazzo, [place]ou[place]

## 2022-09-20 ENCOUNTER — Encounter: Payer: Self-pay | Admitting: *Deleted

## 2022-10-12 ENCOUNTER — Telehealth (INDEPENDENT_AMBULATORY_CARE_PROVIDER_SITE_OTHER): Payer: Medicaid Other

## 2022-10-12 DIAGNOSIS — Z3689 Encounter for other specified antenatal screening: Secondary | ICD-10-CM

## 2022-10-12 DIAGNOSIS — O0990 Supervision of high risk pregnancy, unspecified, unspecified trimester: Secondary | ICD-10-CM | POA: Insufficient documentation

## 2022-10-12 NOTE — Progress Notes (Signed)
New OB Intake  I connected with Maureen Cameron on 10/12/22 at 11:15 AM EST by MyChart Video Visit and verified that I am speaking with the correct person using two identifiers. Nurse is located at [facility] and pt is located at home.  I discussed the limitations, risks, security and privacy concerns of performing an evaluation and management service by telephone and the availability of in person appointments. I also discussed with the patient that there may be a patient responsible charge related to this service. The patient expressed understanding and agreed to proceed.  I explained I am completing New OB Intake today. We discussed EDD of 04/27/2023 that is based on LMP of 07/21/2022. Pt is G3/P2. I reviewed her allergies, medications, Medical/Surgical/OB history, and appropriate screenings. I informed her of [facility] services. [name] information placed in AVS. Based on history, this is a high risk pregnancy. Patient Active Problem List   Diagnosis Date Noted   Supervision of high risk pregnancy, antepartum 10/12/2022   History of gestational diabetes mellitus (GDM), not currently pregnant 07/02/2020   Alpha thalassemia silent carrier 06/04/2020   History of cesarean section 05/12/2020   Maternal varicella, non-immune 10/04/2015     Concerns addressed today: -Hx of 2 c-section due to gestational diabetes. -would like a BTL   Delivery Plans Plans to deliver at [facility]. Patient given information for [facility] website for more information about [facility]. Patient is not interested in water birth. Offered upcoming OB visit with CNM to discuss further.  MyChart/Babyscripts MyChart access verified. I explained pt will have some visits in office and some virtually. Babyscripts instructions given and order placed. Patient verifies receipt of registration text/e-mail. Account successfully created and app downloaded.  Blood Pressure Cuff/Weight Scale Patient has her own blood  pressure device. Explained after first prenatal appt pt will check weekly and document in Babyscripts. Patient does have weight scale.  Anatomy US Explained first scheduled [place] will be around 19 weeks. Anatomy US scheduled for 12/01/2022 at 09:45 am. Pt notified to arrive at 09:30 am.  Labs Discussed Avelina Laine genetic screening with patient. Would like both Panorama and Horizon drawn at new OB visit. Routine prenatal labs needed.  COVID Vaccine Patient has not had COVID vaccine.   Is patient a CenteringPregnancy candidate?  Declined Declined due to Childcare  Is patient a Mom+Baby Combined Care candidate?  Not a candidate   If accepted, Mom+Baby staff notified  Social Determinants of Health Food Insecurity: Patient denies food insecurity. WIC Referral: Patient is interested in referral to [facility].  Transportation: Patient denies transportation needs. Childcare: Discussed no children allowed at ultrasound appointments. Offered childcare services; patient declines childcare services at this time.  First visit review I reviewed new OB appt with patient. I explained they will have a provider visit that includes initial ob labs, genetic screening, hemoglobin A1C, GC/CH, and may require pelvic exam. Explained pt will be seen by Adam Phenix, MD at first visit; encounter routed to appropriate provider. Explained that patient will be seen by pregnancy navigator following visit with provider.   Vidal Schwalbe, [place] 10/12/2022  11:10 AM

## 2022-10-16 NOTE — L&D Delivery Note (Signed)
OB/GYN Faculty Practice Delivery Note  Maureen Cameron is a 32 y.o. [id number] s/p SVD at [redacted]w[redacted]d She was admitted for IUFD.   ROM: 0h 091mith brown fluid GBS Status: Unknown   Maximum Maternal Temperature: Temp (24hrs), Avg:98.9 F (37.2 C), Min:98.4 F (36.9 C), Max:99.1 F (37.3 C)    Labor Progress: Patient arrived at 0 cm dilation and was induced with cytotec.   Delivery Date/Time: 11/25/2022 at [id number]: Called to patients room because she was reporting more pressure in the vaginal area. Cervical exam with fetal parts in the vagina. Patient pushed twice with delivery of the fetus. Cord clamp placed and patient given additional 800 mcg oral cytotec. Placenta still in at time of change. Handed off to oncoming day team.  Lacerations: None    ViGifford ShaveMD  OB Fellow  11/25/2022 6:30 AM

## 2022-10-20 ENCOUNTER — Encounter: Payer: Medicaid Other | Admitting: Obstetrics & Gynecology

## 2022-10-20 ENCOUNTER — Other Ambulatory Visit: Payer: Self-pay

## 2022-10-20 ENCOUNTER — Other Ambulatory Visit (HOSPITAL_COMMUNITY)
Admission: RE | Admit: 2022-10-20 | Discharge: 2022-10-20 | Disposition: A | Discharge status: Home / Self Care | Payer: Medicaid Other | Source: Ambulatory Visit | Attending: Obstetrics & Gynecology | Admitting: Obstetrics & Gynecology

## 2022-10-20 ENCOUNTER — Ambulatory Visit (INDEPENDENT_AMBULATORY_CARE_PROVIDER_SITE_OTHER): Payer: Medicaid Other | Admitting: Obstetrics & Gynecology

## 2022-10-20 VITALS — BP 121/68 | HR 98 | Wt 152.3 lb

## 2022-10-20 DIAGNOSIS — O09899 Supervision of other high risk pregnancies, unspecified trimester: Secondary | ICD-10-CM

## 2022-10-20 DIAGNOSIS — Z3A13 13 weeks gestation of pregnancy: Secondary | ICD-10-CM

## 2022-10-20 DIAGNOSIS — O0990 Supervision of high risk pregnancy, unspecified, unspecified trimester: Secondary | ICD-10-CM | POA: Insufficient documentation

## 2022-10-20 DIAGNOSIS — D563 Thalassemia minor: Secondary | ICD-10-CM

## 2022-10-20 DIAGNOSIS — Z98891 History of uterine scar from previous surgery: Secondary | ICD-10-CM | POA: Diagnosis not present

## 2022-10-20 DIAGNOSIS — Z8632 Personal history of gestational diabetes: Secondary | ICD-10-CM | POA: Diagnosis not present

## 2022-10-20 DIAGNOSIS — Z2839 Other underimmunization status: Secondary | ICD-10-CM

## 2022-10-20 NOTE — Progress Notes (Unsigned)
[facility]  PRENATAL VISIT NOTE  Subjective:  Maureen Cameron is a 32 y.o. G3P2002 at [redacted]w[redacted]d being seen today for ongoing prenatal care.  She is currently monitored for the following issues for this high-risk pregnancy and has Maternal varicella, non-immune; History of cesarean section; Alpha thalassemia silent carrier; History of gestational diabetes mellitus (GDM), not currently pregnant; and Supervision of high risk pregnancy, antepartum on their problem list.  Patient reports  vaginal discharge .    She reports vaginal discharge for the last 6 weeks. She feels that it is quite a large amount. She descries the color as clear to white and thinner. Denies odor and dyspareunia. She reports occasional vaginal pruritus. Denies fever, chills, abdominal/pelvic pain, dysuria, urinary frequency or hematuria. No history of STIs. She has had a history of candida during her last pregnancies and feels this is the same.  She reports occasional nausea but is not having vomiting and it comes and goes. She is not too bothered by it at the moment.   She does have a history of gestational diabetes. It was originally treated with just diet but states towards the end of her last pregnancy she had to be on metformin. No history of gestational hypertension.    . Vag. Bleeding: None.   . Denies leaking of fluid.   The following portions of the patient's history were reviewed and updated as appropriate: allergies, current medications, past family history, past medical history, past social history, past surgical history and problem list.   Objective:   Vitals:   10/20/22 1009  BP: 121/68  Pulse: 98  Weight: 152 lb 4.8 oz (69.1 kg)    Fetal Status: Fetal Heart Rate (bpm): 162         General:  Alert, oriented and cooperative. Patient is in no acute distress.  Skin: Skin is warm and dry. No rash noted.   Cardiovascular: Normal heart rate noted. Flow murmur is noted.  Respiratory: Normal respiratory effort, no problems  with respiration noted  Abdomen: Soft, gravid, appropriate for gestational age.       Pelvic: Cervical exam deferred        Extremities: Normal range of motion.     Mental Status: Normal mood and affect. Normal behavior. Normal judgment and thought content.   Assessment and Plan:  Pregnancy: G3P2002 at [redacted]w[redacted]d 1. Supervision of high risk pregnancy, antepartum Stable, doing well. Continue with routine prenatal care. She has had some nausea but is not significantly bothered at this time. We discussed that her risk of twins is higher as she is a fraternal twin herself. She plans to either have a tubal ligation or hysterectomy after this pregnancy.  2. Alpha thalassemia silent carrier She believes her husband was tested at one of the other pregnancies but is unsure. She will confirm.  3. History of cesarean section She plans to have another c-section for this pregnancy.  4. History of gestational diabetes mellitus (GDM) From her most recent pregnancy, she initially was diet controlled but had to use metformin near the end.   5. Maternal varicella, non-immune  6. Vaginal discharge A vaginal swab was performed today. Further recommendations will depend on results.   Preterm labor symptoms and general obstetric precautions including but not limited to vaginal bleeding, contractions, leaking of fluid and fetal movement were reviewed in detail with the patient. Please refer to After Visit Summary for other counseling recommendations.   No follow-ups on file.  Future Appointments  Date Time Provider [place]  12/01/2022  9:30 AM [facility] NURSE [facility]  12/01/2022  9:45 AM [facility] US5 [facility] [place]    Jennica H Long, Student-PA

## 2022-10-21 LAB — CBC/D/PLT+RPR+RH+ABO+RUBIGG...
Antibody Screen: NEGATIVE
Basophils Absolute: 0 10*3/uL (ref 0.0–0.2)
Basos: 0 %
EOS (ABSOLUTE): 0.1 10*3/uL (ref 0.0–0.4)
Eos: 2 %
HCV Ab: NONREACTIVE
HIV Screen 4th Generation wRfx: NONREACTIVE
Hematocrit: 38.7 % (ref 34.0–46.6)
Hemoglobin: 12.6 g/dL (ref 11.1–15.9)
Hepatitis B Surface Ag: NEGATIVE
Immature Grans (Abs): 0 10*3/uL (ref 0.0–0.1)
Immature Granulocytes: 0 %
Lymphocytes Absolute: 1.3 10*3/uL (ref 0.7–3.1)
Lymphs: 19 %
MCH: 23.1 pg — ABNORMAL LOW (ref 26.6–33.0)
MCHC: 32.6 g/dL (ref 31.5–35.7)
MCV: 71 fL — ABNORMAL LOW (ref 79–97)
Monocytes Absolute: 0.4 10*3/uL (ref 0.1–0.9)
Monocytes: 5 %
Neutrophils Absolute: 5 10*3/uL (ref 1.4–7.0)
Neutrophils: 74 %
Platelets: 282 10*3/uL (ref 150–450)
RBC: 5.46 x10E6/uL — ABNORMAL HIGH (ref 3.77–5.28)
RDW: 19.8 % — ABNORMAL HIGH (ref 11.7–15.4)
RPR Ser Ql: NONREACTIVE
Rh Factor: POSITIVE
Rubella Antibodies, IGG: 1.15 index (ref >0.99)
WBC: 6.8 10*3/uL (ref 3.4–10.8)

## 2022-10-21 LAB — HEMOGLOBIN A1C
Est. average glucose Bld gHb Est-mCnc: 103 mg/dL
Hgb A1c MFr Bld: 5.2 % (ref 4.8–5.6)

## 2022-10-21 LAB — HCV INTERPRETATION

## 2022-10-23 ENCOUNTER — Encounter: Payer: Self-pay | Admitting: *Deleted

## 2022-10-23 LAB — CERVICOVAGINAL ANCILLARY ONLY
Bacterial Vaginitis (gardnerella): POSITIVE — AB
Candida Glabrata: NEGATIVE
Candida Vaginitis: POSITIVE — AB
Chlamydia: NEGATIVE
Comment: NEGATIVE
Comment: NEGATIVE
Comment: NEGATIVE
Comment: NEGATIVE
Comment: NEGATIVE
Comment: NORMAL
Neisseria Gonorrhea: NEGATIVE
Trichomonas: NEGATIVE

## 2022-10-24 LAB — URINE CULTURE, OB REFLEX

## 2022-10-24 LAB — CULTURE, OB URINE

## 2022-10-25 ENCOUNTER — Other Ambulatory Visit: Payer: Self-pay | Admitting: Obstetrics & Gynecology

## 2022-10-25 MED ORDER — METRONIDAZOLE 500 MG PO TABS: 500.0000 mg | ORAL_TABLET | Freq: Two times a day (BID) | ORAL | 0 refills | Status: DC

## 2022-10-25 MED ORDER — AMOXICILLIN-POT CLAVULANATE 875-125 MG PO TABS: 1.0000 | ORAL_TABLET | Freq: Two times a day (BID) | ORAL | 1 refills | Status: DC

## 2022-10-25 NOTE — Addendum Note (Signed)
Addended by: Woodroe Mode on: 10/25/2022 05:30 AM   Modules accepted: Orders

## 2022-10-27 LAB — PANORAMA PRENATAL TEST FULL PANEL:PANORAMA TEST PLUS 5 ADDITIONAL MICRODELETIONS: FETAL FRACTION: 8.1

## 2022-11-24 ENCOUNTER — Inpatient Hospital Stay (HOSPITAL_COMMUNITY): Payer: Medicaid Other

## 2022-11-24 ENCOUNTER — Other Ambulatory Visit: Payer: Self-pay

## 2022-11-24 ENCOUNTER — Inpatient Hospital Stay (HOSPITAL_COMMUNITY)
Admission: AD | Admit: 2022-11-24 | Discharge: 2022-11-25 | DRG: 770 | Disposition: A | Discharge status: Home / Self Care | Payer: Medicaid Other | Attending: Obstetrics & Gynecology | Admitting: Obstetrics & Gynecology

## 2022-11-24 ENCOUNTER — Inpatient Hospital Stay: Payer: Medicaid Other

## 2022-11-24 ENCOUNTER — Ambulatory Visit: Payer: Medicaid Other | Admitting: Obstetrics and Gynecology

## 2022-11-24 DIAGNOSIS — O34219 Maternal care for unspecified type scar from previous cesarean delivery: Secondary | ICD-10-CM | POA: Diagnosis present

## 2022-11-24 DIAGNOSIS — D563 Thalassemia minor: Secondary | ICD-10-CM | POA: Diagnosis present

## 2022-11-24 DIAGNOSIS — Z3A18 18 weeks gestation of pregnancy: Secondary | ICD-10-CM

## 2022-11-24 DIAGNOSIS — Z8249 Family history of ischemic heart disease and other diseases of the circulatory system: Secondary | ICD-10-CM | POA: Diagnosis not present

## 2022-11-24 DIAGNOSIS — Z833 Family history of diabetes mellitus: Secondary | ICD-10-CM | POA: Diagnosis not present

## 2022-11-24 DIAGNOSIS — O364XX0 Maternal care for intrauterine death, not applicable or unspecified: Secondary | ICD-10-CM

## 2022-11-24 DIAGNOSIS — O0990 Supervision of high risk pregnancy, unspecified, unspecified trimester: Principal | ICD-10-CM

## 2022-11-24 DIAGNOSIS — O021 Missed abortion: Principal | ICD-10-CM | POA: Diagnosis present

## 2022-11-24 DIAGNOSIS — O321XX0 Maternal care for breech presentation, not applicable or unspecified: Secondary | ICD-10-CM

## 2022-11-24 LAB — CBC
HCT: 42.5 % (ref 36.0–46.0)
Hemoglobin: 14.1 g/dL (ref 12.0–15.0)
MCH: 24.4 pg — ABNORMAL LOW (ref 26.0–34.0)
MCHC: 33.2 g/dL (ref 30.0–36.0)
MCV: 73.4 fL — ABNORMAL LOW (ref 80.0–100.0)
Platelets: 297 10*3/uL (ref 150–400)
RBC: 5.79 MIL/uL — ABNORMAL HIGH (ref 3.87–5.11)
RDW: 18.5 % — ABNORMAL HIGH (ref 11.5–15.5)
WBC: 8.5 10*3/uL (ref 4.0–10.5)
nRBC: 0 % (ref 0.0–0.2)

## 2022-11-24 LAB — TYPE AND SCREEN
ABO/RH(D): AB POS
Antibody Screen: NEGATIVE

## 2022-11-24 MED ORDER — LACTATED RINGERS IV SOLN: 500.0000 mL | INTRAVENOUS | Status: DC | PRN

## 2022-11-24 MED ORDER — DIPHENHYDRAMINE HCL 50 MG/ML IJ SOLN: 12.5000 mg | INTRAMUSCULAR | Status: DC | PRN

## 2022-11-24 MED ORDER — LIDOCAINE HCL (PF) 1 % IJ SOLN: 30.0000 mL | INTRAMUSCULAR | Status: DC | PRN

## 2022-11-24 MED ORDER — OXYCODONE-ACETAMINOPHEN 5-325 MG PO TABS: 2.0000 | ORAL_TABLET | ORAL | Status: DC | PRN

## 2022-11-24 MED ORDER — LACTATED RINGERS IV SOLN: 500.0000 mL | Freq: Once | INTRAVENOUS | Status: DC

## 2022-11-24 MED ORDER — EPHEDRINE 5 MG/ML INJ: 10.0000 mg | INTRAVENOUS | Status: DC | PRN

## 2022-11-24 MED ORDER — OXYTOCIN BOLUS FROM INFUSION
333.0000 mL | Freq: Once | INTRAVENOUS | Status: AC
  Administered 2022-11-25: 125 mL via INTRAVENOUS
  Administered 2022-11-25: 333 mL via INTRAVENOUS

## 2022-11-24 MED ORDER — OXYCODONE-ACETAMINOPHEN 5-325 MG PO TABS: 1.0000 | ORAL_TABLET | ORAL | Status: DC | PRN

## 2022-11-24 MED ORDER — FENTANYL-BUPIVACAINE-NACL 0.5-0.125-0.9 MG/250ML-% EP SOLN
12.0000 mL/h | EPIDURAL | Status: DC | PRN
  Administered 2022-11-25: 12 mL/h via EPIDURAL
  Filled 2022-11-24: qty 250

## 2022-11-24 MED ORDER — FENTANYL CITRATE (PF) 100 MCG/2ML IJ SOLN: 50.0000 ug | INTRAMUSCULAR | Status: DC | PRN

## 2022-11-24 MED ORDER — MISOPROSTOL 200 MCG PO TABS: 200.0000 ug | ORAL_TABLET | ORAL | Status: DC | PRN

## 2022-11-24 MED ORDER — LACTATED RINGERS IV SOLN: INTRAVENOUS | Status: DC

## 2022-11-24 MED ORDER — MISOPROSTOL 200 MCG PO TABS
400.0000 ug | ORAL_TABLET | Freq: Once | ORAL | Status: AC
  Administered 2022-11-24: 400 ug via VAGINAL
  Filled 2022-11-24: qty 2

## 2022-11-24 MED ORDER — ONDANSETRON HCL 4 MG/2ML IJ SOLN: 4.0000 mg | Freq: Four times a day (QID) | INTRAMUSCULAR | Status: DC | PRN

## 2022-11-24 MED ORDER — SOD CITRATE-CITRIC ACID 500-334 MG/5ML PO SOLN: 30.0000 mL | ORAL | Status: DC | PRN

## 2022-11-24 MED ORDER — OXYTOCIN-SODIUM CHLORIDE 30-0.9 UT/500ML-% IV SOLN
2.5000 [IU]/h | INTRAVENOUS | Status: DC
  Filled 2022-11-24: qty 500

## 2022-11-24 MED ORDER — PHENYLEPHRINE 80 MCG/ML (10ML) SYRINGE FOR IV PUSH (FOR BLOOD PRESSURE SUPPORT): 80.0000 ug | PREFILLED_SYRINGE | INTRAVENOUS | Status: DC | PRN

## 2022-11-24 MED ORDER — ACETAMINOPHEN 325 MG PO TABS: 650.0000 mg | ORAL_TABLET | ORAL | Status: DC | PRN

## 2022-11-24 NOTE — MAU Provider Note (Signed)
Please refer to [place] for admission details.  Gerlene Fee, DO OB Fellow, [place] for [place] 11/24/2022, 2:59 PM

## 2022-11-24 NOTE — H&P (Cosign Needed Addendum)
OBSTETRIC ADMISSION HISTORY AND PHYSICAL  Maureen Cameron is a 32 y.o. female G3P2002 with IUP at 41w0dby LMP presenting for IUFD less than 20 weeks. Seen in office today with absent HR on doppler and absent cardiac activity and movement. [facility] confirmed evidence of fetal demise. She reports no LOF, no VB. She prefers medical induction over expectant management.   She [facility] her [facility] care at  [place] [facility] --->  Estimated Date of Delivery: 04/27/23  Sono:    @[redacted]w[redacted]d$ , breech presentation, absent cardiac activity    Prenatal History/Complications:  -Hx of C/S x2 (primary arrest of descent) -Varicella NI -Alpha thal silent carrier -Hx of GDM   Past Medical History: Past Medical History:  Diagnosis Date   Gestational diabetes    History of  gestational diabetes 11/18/2015    Past Surgical History: Past Surgical History:  Procedure Laterality Date   CESAREAN SECTION N/A 11/19/2015   Procedure: CESAREAN SECTION;  Surgeon: TDonnamae Jude MD;  [facility];  Service: Obstetrics;  Laterality: N/A;   CESAREAN SECTION N/A 09/11/2020   Procedure: REPEAT CESAREAN SECTION;  Surgeon: AOsborne Oman MD;  [facility];  Service: Obstetrics;  Laterality: N/A;    Obstetrical History: OB History     Gravida  3   Para  2   Term  2   Preterm  0   AB  0   Living  2      SAB  0   IAB  0   Ectopic  0   Multiple  0   Live Births  2           Social History Social History   Socioeconomic History   Marital status: Married    Spouse name: Not on file   Number of children: Not on file   Years of education: Not on file   Highest education level: Bachelor's degree (e.g., BA, AB, BS)  Occupational History   Not on file  Tobacco Use   Smoking status: Never   Smokeless tobacco: Never  Vaping Use   Vaping Use: Never used  Substance and Sexual Activity   Alcohol use: No   Drug use: No   Sexual activity: Yes  Other Topics Concern   Not on  file  Social History Narrative   Not on file   Social Determinants of Health   Financial Resource Strain: Low Risk  (11/24/2022)   Overall Financial Resource Strain (CARDIA)    Difficulty of Paying Living Expenses: Not hard at all  Food Insecurity: No Food Insecurity (11/24/2022)   Hunger Vital Sign    Worried About Running Out of Food in the Last Year: Never true    Ran Out of Food in the Last Year: Never true  Transportation Needs: No Transportation Needs (11/24/2022)   PRAPARE - THydrologist(Medical): No    Lack of Transportation (Non-Medical): No  Physical Activity: Unknown (11/24/2022)   Exercise Vital Sign    Days of Exercise per Week: 0 days    Minutes of Exercise per Session: Not on file  Stress: No Stress Concern Present (11/24/2022)   [place]   Feeling of Stress : Not at all  Social Connections: Moderately Isolated (11/24/2022)   Social Connection and Isolation Panel [NHANES]    Frequency of Communication with Friends and Family: More than three times a week  Frequency of Social Gatherings with Friends and Family: Three times a week    Attends Religious Services: Never    Active Member of Clubs or Organizations: No    Attends Music therapist: Not on file    Marital Status: Married   Family History: Family History  Problem Relation Age of Onset   Heart disease Mother    Hypertension Mother    Diabetes Mother    Hypertension Father    Allergies: No Known Allergies  Medications Prior to Admission  Medication Sig Dispense Refill Last Dose   Prenatal Vit-Fe Fumarate-FA (PRENATAL PO) Take 1 tablet by mouth daily.    11/23/2022   amoxicillin-clavulanate (AUGMENTIN) 875-125 MG tablet Take 1 tablet by mouth 2 (two) times daily. 14 tablet 1    metroNIDAZOLE (FLAGYL) 500 MG tablet Take 1 tablet (500 mg total) by mouth 2 (two) times daily. 14 tablet 0    Review of  Systems   All systems reviewed and negative except as stated in HPI  Blood pressure 135/67, pulse (!) 113, temperature 99.1 F (37.3 C), temperature source Oral, resp. rate 20, height 5' 4"$  (1.626 m), weight 70.2 kg, last menstrual period 07/21/2022, SpO2 100 %, currently breastfeeding. General appearance: alert and emotional distressed , husband and toddler daughter at bedside Lungs: normal effort Heart: mild tachycardia Abdomen: gravid Pelvic: deferred Presentation: breech, via [place]   Prenatal labs: ABO, Rh: AB/Positive/-- (01/05 1141) Antibody: Negative (01/05 1141) Rubella: 1.15 (01/05 1141) RPR: Non Reactive (01/05 1141)  HBsAg: Negative (01/05 1141)  HIV: Non Reactive (01/05 1141)  GBS:    1 hr Glucola n/a Genetic screening n/a Anatomy US n/a  No results found for this or any previous visit (from the past 24 hour(s)).  Patient Active Problem List   Diagnosis Date Noted   Supervision of high risk pregnancy, antepartum 10/12/2022   History of gestational diabetes mellitus (GDM), not currently pregnant 07/02/2020   Alpha thalassemia silent carrier 06/04/2020   History of cesarean section 05/12/2020   Maternal varicella, non-immune 10/04/2015    Assessment/Plan:  Maureen Cameron is a 32 y.o. G3P2002 at 75w0dhere for IUFD, diagnosed 2/9 following routine OV. Hx of C/S x2. Discussed expectant management, medical management, and D&E. Prefers medical management. Discussed Anora.   #Labor: Induction with cytotec [id number] cytotec every 4 hours #Pain: May have epidural when desired #ID: Varicella NI  The above patient and plan was discussed with Dr. ArRoselie Awkwardn the MAU.   SiPort LavacaDO  11/24/2022, 3:01 PM

## 2022-11-24 NOTE — MAU Note (Signed)
Pt decided she would like to stay and have medical management of fetal  loss. Notified provider.

## 2022-11-24 NOTE — Progress Notes (Signed)
   PRENATAL VISIT NOTE  Subjective:  Maureen Cameron is a 32 y.o. G3P2002 at [redacted]w[redacted]d being seen today for ongoing prenatal care.  She is currently monitored for the following issues for this low-risk pregnancy and has Maternal varicella, non-immune; History of cesarean section; Alpha thalassemia silent carrier; History of gestational diabetes mellitus (GDM), not currently pregnant; and Supervision of high risk pregnancy, antepartum on their problem list.   The following portions of the patient's history were reviewed and updated as appropriate: allergies, current medications, past family history, past medical history, past social history, past surgical history and problem list. Problem list updated.  During the nursing evaluation, no fetal heart tones could be found.  I was asked to perform a bedside ultrasound.  The fetal vertex was easily seen in breech/transverse position.  No fetal movement was noted.  I was not able to visualize any fetal heart motion.  Objective:  There were no vitals filed for this visit.  Fetal Status:           General:  Alert, oriented and cooperative. Patient is in no acute distress.  Skin: Skin is warm and dry. No rash noted.   Cardiovascular: Normal heart rate noted  Respiratory: Normal respiratory effort, no problems with respiration noted  Abdomen: Soft, gravid, appropriate for gestational age.        Pelvic: Cervical exam deferred        Extremities: Normal range of motion.     Mental Status:  Normal mood and affect. Normal behavior. Normal judgment and thought content.   Assessment and Plan:  Pregnancy: G3P2002 at [redacted]w[redacted]d  1. [redacted] weeks gestation of pregnancy Unfortunate high suspicion for IUFD Pt advised to immediately go to MAU for formal ultrasound for confirmation   Please refer to After Visit Summary for other counseling recommendations.   No follow-ups on file.   Lynnda Shields, MD Faculty Attending Center for [facility]

## 2022-11-24 NOTE — MAU Note (Signed)
Maureen Cameron is a 32 y.o. at 7w0dhere in MAU reporting: sent over from Dr's appointment, routine appt today.  No FH found.  Pt denies pain or bleeding.  Has had no problems, is very anxious.  Onset of complaint: today Pain score: none Vitals:   11/24/22 1235  BP: 135/67  Pulse: (!) 113  Resp: 20  Temp: 99.1 F (37.3 C)  SpO2: 100%     FHT:pt upset, did not recheck Lab orders placed from triage:  none

## 2022-11-25 ENCOUNTER — Inpatient Hospital Stay (HOSPITAL_COMMUNITY): Payer: Medicaid Other | Admitting: Anesthesiology

## 2022-11-25 DIAGNOSIS — O321XX0 Maternal care for breech presentation, not applicable or unspecified: Secondary | ICD-10-CM | POA: Diagnosis not present

## 2022-11-25 DIAGNOSIS — Z3A18 18 weeks gestation of pregnancy: Secondary | ICD-10-CM | POA: Diagnosis not present

## 2022-11-25 DIAGNOSIS — O364XX0 Maternal care for intrauterine death, not applicable or unspecified: Secondary | ICD-10-CM | POA: Diagnosis not present

## 2022-11-25 LAB — RPR: RPR Ser Ql: NONREACTIVE

## 2022-11-25 MED ORDER — LIDOCAINE-EPINEPHRINE (PF) 2 %-1:200000 IJ SOLN
INTRAMUSCULAR | Status: DC | PRN
  Administered 2022-11-25: 5 mL via EPIDURAL

## 2022-11-25 MED ORDER — COCONUT OIL OIL: 1.0000 | TOPICAL_OIL | Status: DC | PRN

## 2022-11-25 MED ORDER — SIMETHICONE 80 MG PO CHEW: 80.0000 mg | CHEWABLE_TABLET | ORAL | Status: DC | PRN

## 2022-11-25 MED ORDER — IBUPROFEN 600 MG PO TABS: 600.0000 mg | ORAL_TABLET | Freq: Four times a day (QID) | ORAL | 0 refills | Status: DC

## 2022-11-25 MED ORDER — MISOPROSTOL 200 MCG PO TABS
ORAL_TABLET | ORAL | Status: AC
  Filled 2022-11-25: qty 4

## 2022-11-25 MED ORDER — ONDANSETRON HCL 4 MG/2ML IJ SOLN: 4.0000 mg | INTRAMUSCULAR | Status: DC | PRN

## 2022-11-25 MED ORDER — MISOPROSTOL 200 MCG PO TABS
400.0000 ug | ORAL_TABLET | Freq: Once | ORAL | Status: AC
  Administered 2022-11-25: 400 ug via VAGINAL
  Filled 2022-11-25: qty 2

## 2022-11-25 MED ORDER — MISOPROSTOL 200 MCG PO TABS
800.0000 ug | ORAL_TABLET | Freq: Once | ORAL | Status: AC
  Administered 2022-11-25: 800 ug via ORAL

## 2022-11-25 MED ORDER — ZOLPIDEM TARTRATE 5 MG PO TABS: 5.0000 mg | ORAL_TABLET | Freq: Every evening | ORAL | Status: DC | PRN

## 2022-11-25 MED ORDER — DIBUCAINE (PERIANAL) 1 % EX OINT: 1.0000 | TOPICAL_OINTMENT | CUTANEOUS | Status: DC | PRN

## 2022-11-25 MED ORDER — ONDANSETRON HCL 4 MG PO TABS: 4.0000 mg | ORAL_TABLET | ORAL | Status: DC | PRN

## 2022-11-25 MED ORDER — WITCH HAZEL-GLYCERIN EX PADS: 1.0000 | MEDICATED_PAD | CUTANEOUS | Status: DC | PRN

## 2022-11-25 MED ORDER — SENNOSIDES-DOCUSATE SODIUM 8.6-50 MG PO TABS: 2.0000 | ORAL_TABLET | Freq: Every day | ORAL | Status: DC

## 2022-11-25 MED ORDER — ACETAMINOPHEN 325 MG PO TABS: 650.0000 mg | ORAL_TABLET | ORAL | Status: DC | PRN

## 2022-11-25 MED ORDER — PRENATAL MULTIVITAMIN CH: 1.0000 | ORAL_TABLET | Freq: Every day | ORAL | Status: DC

## 2022-11-25 MED ORDER — BENZOCAINE-MENTHOL 20-0.5 % EX AERO: 1.0000 | INHALATION_SPRAY | CUTANEOUS | Status: DC | PRN

## 2022-11-25 MED ORDER — TETANUS-DIPHTH-ACELL PERTUSSIS 5-2.5-18.5 LF-MCG/0.5 IM SUSY: 0.5000 mL | PREFILLED_SYRINGE | Freq: Once | INTRAMUSCULAR | Status: DC

## 2022-11-25 MED ORDER — IBUPROFEN 600 MG PO TABS
600.0000 mg | ORAL_TABLET | Freq: Four times a day (QID) | ORAL | Status: DC
  Filled 2022-11-25: qty 1

## 2022-11-25 MED ORDER — DIPHENHYDRAMINE HCL 25 MG PO CAPS: 25.0000 mg | ORAL_CAPSULE | Freq: Four times a day (QID) | ORAL | Status: DC | PRN

## 2022-11-25 NOTE — Progress Notes (Signed)
LABOR PROGRESS NOTE  Maureen Cameron is a 32 y.o. G3P2002 at [redacted]w[redacted]d admitted for IUFD@ 18 wks   Subjective: Comfortable with no concerns   Objective: BP 108/69   Pulse 86   Temp 99.1 F (37.3 C) (Oral)   Resp 14   Ht 5' 4"$  (1.626 m)   Wt 70.2 kg   LMP 07/21/2022   SpO2 100%   BMI 26.57 kg/m  or  Vitals:   11/24/22 2050 11/24/22 2235 11/24/22 2351 11/25/22 0134  BP: (!) 104/58 104/63 (!) 97/54 108/69  Pulse: 92 89 78 86  Resp: 18  14   Temp:      TempSrc:      SpO2:      Weight:      Height:         Dilation: Closed Effacement (%): Thick Station: -3 Exam by:: CGustavus BryantRN  Labs: Lab Results  Component Value Date   WBC 8.5 11/24/2022   HGB 14.1 11/24/2022   HCT 42.5 11/24/2022   MCV 73.4 (L) 11/24/2022   PLT 297 11/24/2022    Patient Active Problem List   Diagnosis Date Noted   IUFD at less than 20 weeks of gestation 11/24/2022   Supervision of high risk pregnancy, antepartum 10/12/2022   History of gestational diabetes mellitus (GDM), not currently pregnant 07/02/2020   Alpha thalassemia silent carrier 06/04/2020   History of cesarean section 05/12/2020   Maternal varicella, non-immune 10/04/2015    Assessment / Plan: 32y.o. G3P2002 at 135w1dere for IOL for IUFD @18$  wks   Labor: S/P 400 mcg cytotec, Repeat at this time and recheck in 4 hours.  Pain Control:  per patient request  Anticipated MOD:  vaginal   ViGifford ShaveMD  OB Fellow  11/25/2022, 2:17 AM

## 2022-11-25 NOTE — Progress Notes (Signed)
Post Partum Day 0 Subjective: cramps  Objective: Blood pressure 106/64, pulse 70, temperature 98.6 F (37 C), temperature source Oral, resp. rate 16, height 5' 4"$  (1.626 m), weight 70.2 kg, last menstrual period 07/21/2022, SpO2 99 %, currently breastfeeding.  Physical Exam:  General: alert, cooperative, and no distress Lochia: appropriate, placenta expelled from the vagina intact Uterine Fundus: firm Incision:  DVT Evaluation: No evidence of DVT seen on physical exam.  Recent Labs    11/24/22 1830  HGB 14.1  HCT 42.5    Assessment/Plan: Placenta delivered intact, routine postpartum care   LOS: 1 day   Emeterio Reeve, MD 11/25/2022, 11:42 AM

## 2022-11-25 NOTE — Anesthesia Preprocedure Evaluation (Signed)
Anesthesia Evaluation  Patient identified by MRN, date of birth, ID band Patient awake    Reviewed: Allergy & Precautions, NPO status , Patient's Chart, lab work & pertinent test results  Airway Mallampati: II  TM Distance: >3 FB Neck ROM: Full    Dental no notable dental hx.    Pulmonary neg pulmonary ROS   Pulmonary exam normal breath sounds clear to auscultation       Cardiovascular negative cardio ROS Normal cardiovascular exam Rhythm:Regular Rate:Normal     Neuro/Psych negative neurological ROS  negative psych ROS   GI/Hepatic negative GI ROS, Neg liver ROS,,,  Endo/Other  negative endocrine ROS    Renal/GU negative Renal ROS  negative genitourinary   Musculoskeletal negative musculoskeletal ROS (+)    Abdominal   Peds  Hematology negative hematology ROS (+)   Anesthesia Other Findings IUFD at 18/[redacted] weeks gestation, h/o C/Sx2  Reproductive/Obstetrics                             Anesthesia Physical Anesthesia Plan  ASA: 2  Anesthesia Plan: Epidural   Post-op Pain Management:    Induction:   PONV Risk Score and Plan: Treatment may vary due to age or medical condition  Airway Management Planned: Natural Airway  Additional Equipment:   Intra-op Plan:   Post-operative Plan:   Informed Consent: I have reviewed the patients History and Physical, chart, labs and discussed the procedure including the risks, benefits and alternatives for the proposed anesthesia with the patient or authorized representative who has indicated his/her understanding and acceptance.       Plan Discussed with: Anesthesiologist  Anesthesia Plan Comments: (Patient identified. Risks, benefits, options discussed with patient including but not limited to bleeding, infection, nerve damage, paralysis, failed block, incomplete pain control, headache, blood pressure changes, nausea, vomiting, reactions to  medication, itching, and post partum back pain. Confirmed with bedside nurse the patient's most recent platelet count. Confirmed with the patient that they are not taking any anticoagulation, have any bleeding history or any family history of bleeding disorders. Patient expressed understanding and wishes to proceed. All questions were answered. )       Anesthesia Quick Evaluation

## 2022-11-25 NOTE — Discharge Summary (Signed)
Postpartum Discharge Summary  Date of Service updated 11/25/2022     Patient Name: Maureen Cameron DOB: Dec 20, 1990 MRN: [id number]  Date of admission: 11/24/2022 Delivery date:11/25/2022  Delivering provider: Concepcion Living  Date of discharge: 11/25/2022  Admitting diagnosis: IUFD at less than 20 weeks of gestation [O02.1] Intrauterine pregnancy: [redacted]w[redacted]d    Secondary diagnosis:  Principal Problem:   IUFD at less than 20 weeks of gestation  Additional problems: none    Discharge diagnosis: Preterm Pregnancy Delivered and previable gestation with fetal demise                                               Post partum procedures: none Augmentation: Cytotec Complications: None  Hospital course: Induction of Labor With Vaginal Delivery   32y.o. yo G3P2002 at 155w1das admitted to the hospital 11/24/2022 for induction of labor  Indication for induction:  fetal demise diagnosed at her prenatal visit on 11/24/22  .  Her labor was inducted with Cytotec. Patient had an labor course complicated by prolonged third stage with spontaneous expulsion of the placenta Membrane Rupture Time/Date: 5:48 AM ,11/25/2022   Delivery Method:Vaginal, Spontaneous  Episiotomy: None  Lacerations:  None  Details of delivery can be found in separate delivery note.  Patient had a postpartum course complicated by prolonged third stage with spontaneous expulsion of the placenta Patient is discharged home 11/25/22.  Newborn Data: Birth date:11/25/2022  Birth time:5:52 AM  Gender:Female  Living status:Fetal Demise  Apgars:0 ,0  Weight:25 g   Magnesium Sulfate received: No BMZ received: No Rhophylac:No MMR:No T-DaP:Given prenatally Flu: No Transfusion:No  Physical exam  Vitals:   11/25/22 1103 11/25/22 1130 11/25/22 1230 11/25/22 1331  BP:      Pulse:      Resp: 16 16 16 16  $ Temp: 98.9 F (37.2 C) 98.6 F (37 C)  98.3 F (36.8 C)  TempSrc: Oral Oral  Oral  SpO2:      Weight:      Height:        General: alert, cooperative, and no distress Lochia: appropriate Uterine Fundus: firm Incision: N/A DVT Evaluation: No evidence of DVT seen on physical exam. Labs: Lab Results  Component Value Date   WBC 8.5 11/24/2022   HGB 14.1 11/24/2022   HCT 42.5 11/24/2022   MCV 73.4 (L) 11/24/2022   PLT 297 11/24/2022      Latest Ref Rng & Units 09/12/2020    5:14 AM  CMP  Glucose 70 - 99 mg/dL 74   BUN 6 - 20 mg/dL 5   Creatinine 0.44 - 1.00 mg/dL 0.59   Sodium 135 - 145 mmol/L 135   Potassium 3.5 - 5.1 mmol/L 4.0   Chloride 98 - 111 mmol/L 104   CO2 22 - 32 mmol/L 22   Calcium 8.9 - 10.3 mg/dL 8.6   Total Protein 6.5 - 8.1 g/dL 5.0   Total Bilirubin 0.3 - 1.2 mg/dL 0.4   Alkaline Phos 38 - 126 U/L 107   AST 15 - 41 U/L 27   ALT 0 - 44 U/L 27    Edinburgh Score:    10/11/2020   10:41 AM  Edinburgh Postnatal Depression Scale Screening Tool  I have been able to laugh and see the funny side of things. 0  I have looked forward with enjoyment to  things. 0  I have blamed myself unnecessarily when things went wrong. 0  I have been anxious or worried for no good reason. 0  I have felt scared or panicky for no good reason. 0  Things have been getting on top of me. 0  I have been so unhappy that I have had difficulty sleeping. 0  I have felt sad or miserable. 0  I have been so unhappy that I have been crying. 0  The thought of harming myself has occurred to me. 0  Edinburgh Postnatal Depression Scale Total 0     After visit meds:  Allergies as of 11/25/2022   No Known Allergies      Medication List     STOP taking these medications    amoxicillin-clavulanate 875-125 MG tablet Commonly known as: AUGMENTIN   metroNIDAZOLE 500 MG tablet Commonly known as: FLAGYL       TAKE these medications    ibuprofen 600 MG tablet Commonly known as: ADVIL Take 1 tablet (600 mg total) by mouth every 6 (six) hours.   PRENATAL PO Take 1 tablet by mouth daily.          Discharge home in stable condition Infant [place] Discharge instruction: per After Visit Summary and Postpartum booklet. Activity: Advance as tolerated. Pelvic rest for 6 weeks.  Diet: routine diet Future Appointments: Future Appointments  Date Time Provider [place]  12/01/2022  9:30 AM [facility] NURSE [facility]  12/01/2022  9:45 AM [facility] US5 [facility]  12/22/2022 11:15 AM Caren Macadam, MD [facility]   Follow up Visit:  [place] [facility] [place] [facility] Follow up in 2 week(s).   Specialty: Obstetrics and Gynecology Contact information: [place] [phone]                 Please schedule this patient for a In person postpartum visit in  2-3 weeks  with the following provider: Any provider. Additional Postpartum F/U:Postpartum Depression checkup  High risk pregnancy complicated by:  fetal demise Delivery mode:  Vaginal, Spontaneous  Anticipated Birth Control:  Unsure   11/25/2022 Emeterio Reeve, MD

## 2022-11-25 NOTE — OR Nursing (Signed)
Pt discharged home in POV with FOB and fetal demise. VS WNL.  Education given and explained.  All needs met.

## 2022-11-25 NOTE — Anesthesia Procedure Notes (Signed)
Epidural Patient location during procedure: OB Start time: 11/25/2022 2:15 AM End time: 11/25/2022 2:25 AM  Staffing Anesthesiologist: Freddrick March, MD Performed: anesthesiologist   Preanesthetic Checklist Completed: patient identified, IV checked, risks and benefits discussed, monitors and equipment checked, pre-op evaluation and timeout performed  Epidural Patient position: sitting Prep: DuraPrep and site prepped and draped Patient monitoring: continuous pulse ox, blood pressure, heart rate and cardiac monitor Approach: midline Location: L3-L4 Injection technique: LOR air  Needle:  Needle type: Tuohy  Needle gauge: 17 G Needle length: 9 cm Needle insertion depth: 5 cm Catheter type: closed end flexible Catheter size: 19 Gauge Catheter at skin depth: 10 cm Test dose: negative  Assessment Sensory level: T8 Events: blood not aspirated, no cerebrospinal fluid, injection not painful, no injection resistance, no paresthesia and negative IV test  Additional Notes Patient identified. Risks/Benefits/Options discussed with patient including but not limited to bleeding, infection, nerve damage, paralysis, failed block, incomplete pain control, headache, blood pressure changes, nausea, vomiting, reactions to medication both or allergic, itching and postpartum back pain. Confirmed with bedside nurse the patient's most recent platelet count. Confirmed with patient that they are not currently taking any anticoagulation, have any bleeding history or any family history of bleeding disorders. Patient expressed understanding and wished to proceed. All questions were answered. Sterile technique was used throughout the entire procedure. Please see nursing notes for vital signs. Test dose was given through epidural catheter and negative prior to continuing to dose epidural or start infusion. Warning signs of high block given to the patient including shortness of breath, tingling/numbness in hands,  complete motor block, or any concerning symptoms with instructions to call for help. Patient was given instructions on fall risk and not to get out of bed. All questions and concerns addressed with instructions to call with any issues or inadequate analgesia.  Reason for block:procedure for pain

## 2022-11-25 NOTE — Anesthesia Postprocedure Evaluation (Signed)
Anesthesia Post Note  Patient: Maureen Cameron  Procedure(s) Performed: AN AD HOC LABOR EPIDURAL     Anesthesia Type: Epidural Anesthetic complications: no   No notable events documented. Patient was discharged home when I went to post op epidural round at 16:30 on 2/10  Last Vitals:  Vitals:   11/25/22 1230 11/25/22 1331  BP:    Pulse:    Resp: 16 16  Temp:  36.8 C  SpO2:      Last Pain:  Vitals:   11/25/22 1331  TempSrc: Oral  PainSc: 4    Pain Goal:                   Rayvon Char

## 2022-11-28 LAB — SURGICAL PATHOLOGY

## 2022-12-01 ENCOUNTER — Ambulatory Visit: Payer: Medicaid Other

## 2022-12-01 ENCOUNTER — Other Ambulatory Visit: Payer: Medicaid Other

## 2022-12-06 ENCOUNTER — Telehealth (HOSPITAL_COMMUNITY): Payer: Self-pay | Admitting: *Deleted

## 2022-12-06 DIAGNOSIS — Z1331 Encounter for screening for depression: Secondary | ICD-10-CM

## 2022-12-06 NOTE — Telephone Encounter (Signed)
Patient asked about her bleeding pattern. Patient stated, "I'm not really bleeding anymore. Is that normal." RN reviewed warning signs to report to MD - patient verbalized understanding. Patient voiced no other questions or concerns regarding her health at this time. EPDS=11.  Referral generated for [place] and sent to Dr. Laurey Arrow. Patient requested RN email information on hospital's virtual bereavement support group. Patient also agreed for RN to email a list of maternal mental health resources. Email sent. Erline Levine, RN, 12/06/22, [phone]

## 2022-12-11 LAB — ANORA MISCARRIAGE TEST - FRESH

## 2022-12-14 ENCOUNTER — Telehealth: Payer: Self-pay | Admitting: Lactation Services

## 2022-12-14 NOTE — Telephone Encounter (Signed)
Called patient with results of Anora showing fetus was normal female. Patient with follow up PP appointment on 3/15.   Patient informed of results. Reviewed can discuss results further with Provider at follow up appointment. Reviewed to discuss with provider if family would like Genetic Counseling and that can be set up for them. Patient voiced understanding.   She wanted to know the cause of the miscarriage, reviewed that the Anora testing did not show a genetic reason and that the cause of loss may be undetermined, although best to discuss with provider at her follow up appointment on 3/15. Patient voiced understanding.

## 2022-12-22 ENCOUNTER — Encounter: Payer: Medicaid Other | Admitting: Family Medicine

## 2022-12-29 ENCOUNTER — Ambulatory Visit (INDEPENDENT_AMBULATORY_CARE_PROVIDER_SITE_OTHER): Payer: Medicaid Other | Admitting: Medical

## 2022-12-29 ENCOUNTER — Other Ambulatory Visit: Payer: Self-pay

## 2022-12-29 ENCOUNTER — Encounter: Payer: Self-pay | Admitting: Medical

## 2022-12-29 VITALS — BP 117/84 | HR 83 | Ht 64.0 in | Wt 150.0 lb

## 2022-12-29 DIAGNOSIS — O021 Missed abortion: Secondary | ICD-10-CM

## 2022-12-29 DIAGNOSIS — Z3A18 18 weeks gestation of pregnancy: Secondary | ICD-10-CM | POA: Diagnosis not present

## 2022-12-29 DIAGNOSIS — F5102 Adjustment insomnia: Secondary | ICD-10-CM

## 2022-12-29 DIAGNOSIS — Z3009 Encounter for other general counseling and advice on contraception: Secondary | ICD-10-CM

## 2022-12-29 MED ORDER — NORGESTIMATE-ETH ESTRADIOL 0.25-35 MG-MCG PO TABS: 1.0000 | ORAL_TABLET | Freq: Every day | ORAL | 11 refills | Status: DC

## 2022-12-29 NOTE — Progress Notes (Signed)
    [place] Partum Visit Note  Maureen Cameron is a 32 y.o. G39P2002 female who presents for a postpartum visit. She is 5 weeks postpartum following a normal spontaneous vaginal delivery after IOL due to fetal demise at [redacted] weeks gestation.  I have fully reviewed the prenatal and intrapartum course. The delivery was at 18 gestational weeks.  Anesthesia: epidural. Postpartum course has been uncomplicated. Bleeding staining only, bleeding had stopped, feels this is a period. Bowel function is normal. Bladder function is normal. Patient is not sexually active. Contraception method is abstinence.    [facility] - 12/29/22 0844       Pregnancy Intention Screening   Does the patient want to become pregnant in the next year? No    Does the patient's partner want to become pregnant in the next year? No    Would the patient like to discuss contraceptive options today? Yes      Contraception Wrap Up   Current Method Abstinence    End Method Oral Contraceptive    Contraception Counseling Provided Yes    How was the end contraceptive method provided? Provided on site            The pregnancy intention screening data noted above was reviewed. Potential methods of contraception were discussed. The patient elected to proceed with Oral Contraceptive.    Health Maintenance Due  Topic Date Due   COVID-19 Vaccine (1) Never done   FOOT EXAM  Never done   OPHTHALMOLOGY EXAM  Never done   Diabetic kidney evaluation - Urine ACR  11/18/2016   Diabetic kidney evaluation - eGFR measurement  09/12/2021   INFLUENZA VACCINE  05/16/2022    The following portions of the patient's history were reviewed and updated as appropriate: allergies, current medications, past family history, past medical history, past social history, past surgical history, and problem list.  Review of Systems Pertinent items are noted in HPI.  Objective:  BP 117/84   Pulse 83   Ht 5\' 4"  (1.626 m)   Wt 150 lb (68 kg)   LMP 12/25/2022    Breastfeeding Unknown   BMI 25.75 kg/m    General:  alert and cooperative   Breasts:  not indicated  Lungs: clear to auscultation bilaterally  Heart:  regular rate and rhythm, S1, S2 normal, no murmur, click, rub or gallop  Abdomen: normal findings: soft, non-tender   Wound N/A  GU exam:  not indicated       Assessment:   Normal postpartum exam.  H/O IUFD at [redacted] weeks GA   Plan:   Essential components of care per ACOG recommendations:  1.  Mood and well being: Patient states intermittent sadness and appropriate grief, difficulty sleeping. Referred to [facility]   2. Sexuality, contraception and birth spacing - Patient does not want a pregnancy in the next year.  Desired family size is 2 children.  - Reviewed reproductive life planning. Reviewed contraceptive methods based on pt preferences and effectiveness.  Patient desired Oral Contraceptive today.    3.  Health Maintenance - HM due items addressed Yes - Last pap smear  Diagnosis  Date Value Ref Range Status  04/14/2020   Final   - Negative for intraepithelial lesion or malignancy (NILM)   Pap smear not done at today's visit.  -Breast Cancer screening indicated? No.   4. Chronic Disease/Pregnancy Condition follow up: None - PCP follow up PRN  Kerry Hough, PA-C [facility] [employer], [place]

## 2023-02-17 ENCOUNTER — Telehealth: Payer: Medicaid Other | Admitting: Physician Assistant

## 2023-02-17 DIAGNOSIS — J019 Acute sinusitis, unspecified: Secondary | ICD-10-CM

## 2023-02-17 MED ORDER — AMOXICILLIN-POT CLAVULANATE 875-125 MG PO TABS: 1.0000 | ORAL_TABLET | Freq: Two times a day (BID) | ORAL | 0 refills | Status: DC

## 2023-02-17 NOTE — Patient Instructions (Signed)
  [name], thank you for joining Tylene Fantasia Ward, PA-C for today's virtual visit.  While this provider is not your primary care provider (PCP), if your PCP is located in our provider database this encounter information will be shared with them immediately following your visit.   A [facility] MyChart account gives you access to today's visit and all your visits, tests, and labs performed at [facility] " click here if you don't have a [facility] MyChart account or go to [email or web address]  Consent: (Patient) Jeraldin [place]burg provided verbal consent for this virtual visit at the beginning of the encounter.  Current Medications:  Current Outpatient Medications:    amoxicillin-clavulanate (AUGMENTIN) 875-125 MG tablet, Take 1 tablet by mouth 2 (two) times daily., Disp: 20 tablet, Rfl: 0   norgestimate-ethinyl estradiol (ORTHO-CYCLEN) 0.25-35 MG-MCG tablet, Take 1 tablet by mouth daily., Disp: 28 tablet, Rfl: 11   Medications ordered in this encounter:  Meds ordered this encounter  Medications   amoxicillin-clavulanate (AUGMENTIN) 875-125 MG tablet    Sig: Take 1 tablet by mouth 2 (two) times daily.    Dispense:  20 tablet    Refill:  0    Order Specific Question:   Supervising Provider    Answer:   Merrilee Jansky [id number]     *If you need refills on other medications prior to your next appointment, please contact your pharmacy*  Follow-Up: Call back or seek an in-person evaluation if the symptoms worsen or if the condition fails to improve as anticipated.  [name] [phone]  Other Instructions Recommend Flonase and Mucinex.  Drink plenty of fluids.  Can take Ibuprofen or Tylenol as needed.  Hold antibiotic and start if symptoms persist for 7-10 days.  If no improvement follow up for in person evaluation with PCP or Urgent Care.    If you have been instructed to have an in-person evaluation today at a local Urgent Care facility, please use  the link below. It will take you to a list of all of our available [place] [facility], including address, phone number and hours of operation. Please do not delay care.  [facility]  If you or a family member do not have a primary care provider, use the link below to schedule a visit and establish care. When you choose a [place] primary care physician or advanced practice provider, you gain a long-term partner in health. Find a Primary Care Provider  Learn more about [facility]'s in-office and virtual care options: [place] - Get Care Now

## 2023-02-17 NOTE — Progress Notes (Signed)
Virtual Visit Consent   Maureen Cameron, you are scheduled for a virtual visit with a [facility] provider today. Just as with appointments in the office, your consent must be obtained to participate. Your consent will be active for this visit and any virtual visit you may have with one of our providers in the next 365 days. If you have a MyChart account, a copy of this consent can be sent to you electronically.  As this is a virtual visit, video technology does not allow for your provider to perform a traditional examination. This may limit your provider's ability to fully assess your condition. If your provider identifies any concerns that need to be evaluated in person or the need to arrange testing (such as labs, EKG, etc.), we will make arrangements to do so. Although advances in technology are sophisticated, we cannot ensure that it will always work on either your end or our end. If the connection with a video visit is poor, the visit may have to be switched to a telephone visit. With either a video or telephone visit, we are not always able to ensure that we have a secure connection.  By engaging in this virtual visit, you consent to the provision of healthcare and authorize for your insurance to be billed (if applicable) for the services provided during this visit. Depending on your insurance coverage, you may receive a charge related to this service.  I need to obtain your verbal consent now. Are you willing to proceed with your visit today? Maureen Cameron has provided verbal consent on 02/17/2023 for a virtual visit (video or telephone). Maureen Fantasia Ward, PA-C  Date: 02/17/2023 10:18 AM  Virtual Visit via Video Note   I, Maureen Cameron, connected with  Maureen Cameron  ([id number], [birth year]) on 02/17/23 at 10:15 AM EDT by a video-enabled telemedicine application and verified that I am speaking with the correct person using two identifiers.  Location: Patient: Virtual Visit Location Patient:  Home Provider: Virtual Visit Location Provider: Home   I discussed the limitations of evaluation and management by telemedicine and the availability of in person appointments. The patient expressed understanding and agreed to proceed.    History of Present Illness: Maureen Cameron is a 32 y.o. who identifies as a female who was assigned female at birth, and is being seen today for congestion and sinus pressure that started about 5 days ago.  Pain worse to the left side of her face.  She reports intermittent fevers.  She denies cough, shortness, or wheezing.  She is taking tylenol which is helping temporarily.  Reports daughter sick with similar sx.  HPI: HPI  Problems:  Patient Active Problem List   Diagnosis Date Noted   IUFD at less than 20 weeks of gestation 11/24/2022   History of gestational diabetes mellitus (GDM), not currently pregnant 07/02/2020   Alpha thalassemia silent carrier 06/04/2020   History of cesarean section 05/12/2020   Maternal varicella, non-immune 10/04/2015    Allergies: No Known Allergies Medications:  Current Outpatient Medications:    amoxicillin-clavulanate (AUGMENTIN) 875-125 MG tablet, Take 1 tablet by mouth 2 (two) times daily., Disp: 20 tablet, Rfl: 0   norgestimate-ethinyl estradiol (ORTHO-CYCLEN) 0.25-35 MG-MCG tablet, Take 1 tablet by mouth daily., Disp: 28 tablet, Rfl: 11  Observations/Objective: Patient is well-developed, well-nourished in no acute distress.  Resting comfortably at home.  Head is normocephalic, atraumatic.  No labored breathing.  Speech is clear and coherent with logical content.  Patient is alert and oriented  at baseline.    Assessment and Plan: 1. Acute non-recurrent sinusitis, unspecified location - amoxicillin-clavulanate (AUGMENTIN) 875-125 MG tablet; Take 1 tablet by mouth 2 (two) times daily.  Dispense: 20 tablet; Refill: 0  Supportive care discussed.  In person evaluation precautions discussed.  Advised to hold  antibiotic for a few more days as she has only had 5 days of symptoms.   Follow Up Instructions: I discussed the assessment and treatment plan with the patient. The patient was provided an opportunity to ask questions and all were answered. The patient agreed with the plan and demonstrated an understanding of the instructions.  A copy of instructions were sent to the patient via MyChart unless otherwise noted below.     The patient was advised to call back or seek an in-person evaluation if the symptoms worsen or if the condition fails to improve as anticipated.  Time:  I spent 6 minutes with the patient via telehealth technology discussing the above problems/concerns.    Maureen Fantasia Ward, PA-C

## 2023-03-25 ENCOUNTER — Emergency Department (HOSPITAL_BASED_OUTPATIENT_CLINIC_OR_DEPARTMENT_OTHER): Payer: Medicaid Other

## 2023-03-25 ENCOUNTER — Emergency Department (HOSPITAL_BASED_OUTPATIENT_CLINIC_OR_DEPARTMENT_OTHER)
Admission: EM | Admit: 2023-03-25 | Discharge: 2023-03-25 | Disposition: A | Discharge status: Home / Self Care | Payer: Medicaid Other | Attending: Emergency Medicine | Admitting: Emergency Medicine

## 2023-03-25 ENCOUNTER — Encounter (HOSPITAL_BASED_OUTPATIENT_CLINIC_OR_DEPARTMENT_OTHER): Payer: Self-pay | Admitting: Emergency Medicine

## 2023-03-25 DIAGNOSIS — M25512 Pain in left shoulder: Secondary | ICD-10-CM | POA: Insufficient documentation

## 2023-03-25 DIAGNOSIS — S46912A Strain of unspecified muscle, fascia and tendon at shoulder and upper arm level, left arm, initial encounter: Secondary | ICD-10-CM

## 2023-03-25 DIAGNOSIS — S43422A Sprain of left rotator cuff capsule, initial encounter: Secondary | ICD-10-CM

## 2023-03-25 MED ORDER — CYCLOBENZAPRINE HCL 5 MG PO TABS: 5.0000 mg | ORAL_TABLET | Freq: Three times a day (TID) | ORAL | 0 refills | Status: DC | PRN

## 2023-03-25 NOTE — ED Triage Notes (Signed)
Pt c/o LT shoulder pain intermittently x 1 month; no injury; no repetitive movements; pain resolves with Tylenol

## 2023-03-25 NOTE — Discharge Instructions (Signed)
You may have rotator cuff injury.  As we discussed, I recommend that you take Tylenol or Motrin for pain and Flexeril for muscle spasms  You need to see orthopedic doctor for follow-up and you may need an MRI to further assess  Return to ER if you have worse shoulder pain

## 2023-03-25 NOTE — ED Provider Notes (Signed)
[place] [facility] Provider Note   CSN: [id number] Arrival date & time: 03/25/23  1537     History  Chief Complaint  Patient presents with   Shoulder Pain    Maureen Cameron is a 32 y.o. female history of previous left shoulder surgery here presenting with left shoulder pain.  Patient states that she has been having left shoulder pain worse with movement that is going on for about a month or so.  Patient denies any trauma or injury.  Patient states that when she was 32 years old, she had shoulder fracture and required surgery.  She states that she has not been following up with orthopedic doctor.  Patient takes Tylenol and it helps with the pain  The history is provided by the patient.       Home Medications Prior to Admission medications   Medication Sig Start Date End Date Taking? Authorizing Provider  amoxicillin-clavulanate (AUGMENTIN) 875-125 MG tablet Take 1 tablet by mouth 2 (two) times daily. 02/17/23   Ward, Tylene Fantasia, PA-C  norgestimate-ethinyl estradiol (ORTHO-CYCLEN) 0.25-35 MG-MCG tablet Take 1 tablet by mouth daily. 12/29/22   Marny Lowenstein, PA-C      Allergies    Patient has no known allergies.    Review of Systems   Review of Systems  Musculoskeletal:        Left shoulder pain   All other systems reviewed and are negative.   Physical Exam Updated Vital Signs BP 121/80   Pulse 80   Temp 97.7 F (36.5 C)   Resp 18   Ht 5\' 3"  (1.6 m)   Wt 61.2 kg   LMP 03/21/2023   SpO2 99%   BMI 23.90 kg/m  Physical Exam Vitals and nursing note reviewed.  Constitutional:      Appearance: Normal appearance.  HENT:     Head: Normocephalic.     Nose: Nose normal.     Mouth/Throat:     Mouth: Mucous membranes are moist.  Eyes:     Extraocular Movements: Extraocular movements intact.     Pupils: Pupils are equal, round, and reactive to light.  Cardiovascular:     Rate and Rhythm: Normal rate and regular rhythm.     Pulses:  Normal pulses.     Heart sounds: Normal heart sounds.  Pulmonary:     Effort: Pulmonary effort is normal.     Breath sounds: Normal breath sounds.  Abdominal:     General: Abdomen is flat.  Musculoskeletal:     Cervical back: Normal range of motion and neck supple.     Comments: Left shoulder normal range of motion.  No obvious dislocation.  Patient does have tenderness of the supraspinatus tendon when she tries to elevate her arm.  Patient has normal bicep flexion and tricep extension.  Neurological:     General: No focal deficit present.     Mental Status: She is alert. Mental status is at baseline.  Psychiatric:        Mood and Affect: Mood normal.        Behavior: Behavior normal.     ED Results / Procedures / Treatments   Labs (all labs ordered are listed, but only abnormal results are displayed) Labs Reviewed - No data to display  EKG None  Radiology DG Shoulder Left  Result Date: 03/25/2023 CLINICAL DATA:  Shoulder pain for a month EXAM: LEFT SHOULDER - 3 VIEW COMPARISON:  None Available. FINDINGS: There is no evidence  of fracture or dislocation. There is no evidence of arthropathy or other focal bone abnormality. Soft tissues are unremarkable. IMPRESSION: No acute osseous abnormality Electronically Signed   By: Karen Kays M.D.   On: 03/25/2023 16:23    Procedures Procedures    Medications Ordered in ED Medications - No data to display  ED Course/ Medical Decision Making/ A&P                             Medical Decision Making Maureen Cameron is a 32 y.o. female here presenting with left shoulder pain.  Patient had previous fracture that required operative repair.  This was done when she was 32 years old.  Patient has been having left shoulder pain for about a month now.  Patient does have some tenderness over the supraspinatus tendon.  Suspect rotator cuff injury.  Will refer to Ortho outpatient and try Flexeril as needed.  Also encouraged NSAID use.  She may need  outpatient MRI.   Amount and/or Complexity of Data Reviewed Radiology: ordered.    Final Clinical Impression(s) / ED Diagnoses Final diagnoses:  None    Rx / DC Orders ED Discharge Orders     None         Charlynne Pander, MD 03/25/23 1708

## 2023-08-20 ENCOUNTER — Telehealth: Payer: Medicaid Other | Admitting: Physician Assistant

## 2023-08-20 DIAGNOSIS — N926 Irregular menstruation, unspecified: Secondary | ICD-10-CM

## 2023-08-20 NOTE — Patient Instructions (Signed)
  Maureen Cameron, thank you for joining Maureen Loveless, PA-C for today's virtual visit.  While this provider is not your primary care provider (PCP), if your PCP is located in our provider database this encounter information will be shared with them immediately following your visit.   A [facility] MyChart account gives you access to today's visit and all your visits, tests, and labs performed at [facility] " click here if you don't have a [facility] MyChart account or go to [email or web address]  Consent: (Patient) Maureen Cameron provided verbal consent for this virtual visit at the beginning of the encounter.  Current Medications:  Current Outpatient Medications:    amoxicillin-clavulanate (AUGMENTIN) 875-125 MG tablet, Take 1 tablet by mouth 2 (two) times daily., Disp: 20 tablet, Rfl: 0   cyclobenzaprine (FLEXERIL) 5 MG tablet, Take 1 tablet (5 mg total) by mouth 3 (three) times daily as needed., Disp: 10 tablet, Rfl: 0   norgestimate-ethinyl estradiol (ORTHO-CYCLEN) 0.25-35 MG-MCG tablet, Take 1 tablet by mouth daily., Disp: 28 tablet, Rfl: 11   Medications ordered in this encounter:  No orders of the defined types were placed in this encounter.    *If you need refills on other medications prior to your next appointment, please contact your pharmacy*  Follow-Up: Call back or seek an in-person evaluation if the symptoms worsen or if the condition fails to improve as anticipated.  [facility] [phone]   If you have been instructed to have an in-person evaluation today at a local Urgent Care facility, please use the link below. It will take you to a list of all of our available [facility], including address, phone number and hours of operation. Please do not delay care.  [facility]  If you or a family member do not have a primary care provider, use the link below to schedule a visit and establish care. When you choose a  [facility] primary care physician or advanced practice provider, you gain a long-term partner in health. Find a Primary Care Provider  Learn more about [facility]'s in-office and virtual care options:  - Get Care Now

## 2023-08-20 NOTE — Progress Notes (Signed)
Virtual Visit Consent   Maureen Cameron, you are scheduled for a virtual visit with a [facility] provider today. Just as with appointments in the office, your consent must be obtained to participate. Your consent will be active for this visit and any virtual visit you may have with one of our providers in the next 365 days. If you have a MyChart account, a copy of this consent can be sent to you electronically.  As this is a virtual visit, video technology does not allow for your provider to perform a traditional examination. This may limit your provider's ability to fully assess your condition. If your provider identifies any concerns that need to be evaluated in person or the need to arrange testing (such as labs, EKG, etc.), we will make arrangements to do so. Although advances in technology are sophisticated, we cannot ensure that it will always work on either your end or our end. If the connection with a video visit is poor, the visit may have to be switched to a telephone visit. With either a video or telephone visit, we are not always able to ensure that we have a secure connection.  By engaging in this virtual visit, you consent to the provision of healthcare and authorize for your insurance to be billed (if applicable) for the services provided during this visit. Depending on your insurance coverage, you may receive a charge related to this service.  I need to obtain your verbal consent now. Are you willing to proceed with your visit today? Maureen Cameron has provided verbal consent on 08/20/2023 for a virtual visit (video or telephone). [name], PA-C  Date: 08/20/2023 1:00 PM  Virtual Visit via Video Note   IMargaretann Loveless, connected with  Maureen Cameron  ([id number], [birth year]) on 08/20/23 at 12:45 PM EST by a video-enabled telemedicine application and verified that I am speaking with the correct person using two identifiers.  Location: Patient: Virtual Visit Location  Patient: Home Provider: Virtual Visit Location Provider: Home Office   I discussed the limitations of evaluation and management by telemedicine and the availability of in person appointments. The patient expressed understanding and agreed to proceed.    History of Present Illness: Maureen Cameron is a 32 y.o. who identifies as a female who was assigned female at birth, and is being seen today for vaginal bleeding. She is on OCP and did miss a few days and started another menstrual just yesterday. It is not heavy and not having severe cramps. She wants to make sure this is normal since she did just have her normal menstrual 2 weeks ago.  She is also interested in stopping birth control as well.    Problems:  Patient Active Problem List   Diagnosis Date Noted   IUFD at less than 20 weeks of gestation 11/24/2022   History of gestational diabetes mellitus (GDM), not currently pregnant 07/02/2020   Alpha thalassemia silent carrier 06/04/2020   History of cesarean section 05/12/2020   Maternal varicella, non-immune 10/04/2015    Allergies: No Known Allergies Medications:  Current Outpatient Medications:    amoxicillin-clavulanate (AUGMENTIN) 875-125 MG tablet, Take 1 tablet by mouth 2 (two) times daily., Disp: 20 tablet, Rfl: 0   cyclobenzaprine (FLEXERIL) 5 MG tablet, Take 1 tablet (5 mg total) by mouth 3 (three) times daily as needed., Disp: 10 tablet, Rfl: 0   norgestimate-ethinyl estradiol (ORTHO-CYCLEN) 0.25-35 MG-MCG tablet, Take 1 tablet by mouth daily., Disp: 28 tablet, Rfl: 11  Observations/Objective: Patient is  well-developed, well-nourished in no acute distress.  Resting comfortably at home.  Head is normocephalic, atraumatic.  No labored breathing.  Speech is clear and coherent with logical content.  Patient is alert and oriented at baseline.    Assessment and Plan: 1. Abnormal menstruation  - Secondary to missed OCP and having break through bleeding - Advised this was  normal when pills are missed. Was discussing that if more than 2 days are missed she should start a new pack but mentioned she would rather stop the OCP as it was started to help with headaches and it has not helped. - Advised she could just stop at this point - Discussed that it may take 3 months or so for her body to regulate and menstrual cycles to fully become back to her baseline again.  - She voiced understanding and all questions were answered.  Follow Up Instructions: I discussed the assessment and treatment plan with the patient. The patient was provided an opportunity to ask questions and all were answered. The patient agreed with the plan and demonstrated an understanding of the instructions.  A copy of instructions were sent to the patient via MyChart unless otherwise noted below.    The patient was advised to call back or seek an in-person evaluation if the symptoms worsen or if the condition fails to improve as anticipated.    [name], PA-C

## 2023-10-01 ENCOUNTER — Telehealth: Payer: Medicaid Other | Admitting: Family Medicine

## 2023-10-01 DIAGNOSIS — J019 Acute sinusitis, unspecified: Secondary | ICD-10-CM | POA: Diagnosis not present

## 2023-10-01 DIAGNOSIS — B9689 Other specified bacterial agents as the cause of diseases classified elsewhere: Secondary | ICD-10-CM | POA: Diagnosis not present

## 2023-10-01 MED ORDER — AMOXICILLIN-POT CLAVULANATE 875-125 MG PO TABS: 1.0000 | ORAL_TABLET | Freq: Two times a day (BID) | ORAL | 0 refills | Status: AC

## 2023-10-01 NOTE — Progress Notes (Signed)
Virtual Visit Consent   Azuree Teal, you are scheduled for a virtual visit with a [facility] provider today. Just as with appointments in the office, your consent must be obtained to participate. Your consent will be active for this visit and any virtual visit you may have with one of our providers in the next 365 days. If you have a MyChart account, a copy of this consent can be sent to you electronically.  As this is a virtual visit, video technology does not allow for your provider to perform a traditional examination. This may limit your provider's ability to fully assess your condition. If your provider identifies any concerns that need to be evaluated in person or the need to arrange testing (such as labs, EKG, etc.), we will make arrangements to do so. Although advances in technology are sophisticated, we cannot ensure that it will always work on either your end or our end. If the connection with a video visit is poor, the visit may have to be switched to a telephone visit. With either a video or telephone visit, we are not always able to ensure that we have a secure connection.  By engaging in this virtual visit, you consent to the provision of healthcare and authorize for your insurance to be billed (if applicable) for the services provided during this visit. Depending on your insurance coverage, you may receive a charge related to this service.  I need to obtain your verbal consent now. Are you willing to proceed with your visit today? Maureen Cameron has provided verbal consent on 10/01/2023 for a virtual visit (video or telephone). Freddy Finner, NP  Date: 10/01/2023 9:51 AM  Virtual Visit via Video Note   I, Freddy Finner, connected with  Maureen Cameron  ([id number], 1991-04-07) on 10/01/23 at 10:00 AM EST by a video-enabled telemedicine application and verified that I am speaking with the correct person using two identifiers.  Location: Patient: Virtual Visit Location Patient:  Home Provider: Virtual Visit Location Provider: Home Office   I discussed the limitations of evaluation and management by telemedicine and the availability of in person appointments. The patient expressed understanding and agreed to proceed.    History of Present Illness: Maureen Cameron is a 32 y.o. who identifies as a female who was assigned female at birth, and is being seen today for headache and jaw pain   Onset was headache last Monday - 8 days ago and stuffy  Associated symptoms are left side sinus, jaw, and inner ear and neck pain, sensitivity to chewing  Modifying factors are Tylenol  Denies chest pain, shortness of breath, fevers, chills No dental injury, or surgery, or previous fillings on that side.   Exposure to sick contacts- unknown  Problems:  Patient Active Problem List   Diagnosis Date Noted   IUFD at less than 20 weeks of gestation 11/24/2022   History of gestational diabetes mellitus (GDM), not currently pregnant 07/02/2020   Alpha thalassemia silent carrier 06/04/2020   History of cesarean section 05/12/2020   Maternal varicella, non-immune 10/04/2015    Allergies: No Known Allergies Medications:  Current Outpatient Medications:    amoxicillin-clavulanate (AUGMENTIN) 875-125 MG tablet, Take 1 tablet by mouth 2 (two) times daily., Disp: 20 tablet, Rfl: 0   cyclobenzaprine (FLEXERIL) 5 MG tablet, Take 1 tablet (5 mg total) by mouth 3 (three) times daily as needed., Disp: 10 tablet, Rfl: 0   norgestimate-ethinyl estradiol (ORTHO-CYCLEN) 0.25-35 MG-MCG tablet, Take 1 tablet by mouth daily., Disp: 28  tablet, Rfl: 11  Observations/Objective: Patient is well-developed, well-nourished in no acute distress.  Resting comfortably  at home.  Head is normocephalic, atraumatic.  No labored breathing.  Speech is clear and coherent with logical content.  Patient is alert and oriented at baseline.    Assessment and Plan:  1. Acute bacterial sinusitis (Primary)  -  amoxicillin-clavulanate (AUGMENTIN) 875-125 MG tablet; Take 1 tablet by mouth 2 (two) times daily for 7 days.  Dispense: 14 tablet; Refill: 0  -no red flags during visit  -DDx cavity that is not known -given symptoms started with URI will treat as such since duration is in time with starting ABX. -strict in person precautions reviewed    Reviewed side effects, risks and benefits of medication.    Patient acknowledged agreement and understanding of the plan.   Past Medical, Surgical, Social History, Allergies, and Medications have been Reviewed.     Follow Up Instructions: I discussed the assessment and treatment plan with the patient. The patient was provided an opportunity to ask questions and all were answered. The patient agreed with the plan and demonstrated an understanding of the instructions.  A copy of instructions were sent to the patient via MyChart unless otherwise noted below.    The patient was advised to call back or seek an in-person evaluation if the symptoms worsen or if the condition fails to improve as anticipated.    Freddy Finner, NP

## 2023-10-01 NOTE — Patient Instructions (Signed)
  Maureen Cameron, thank you for joining Freddy Finner, NP for today's virtual visit.  While this provider is not your primary care provider (PCP), if your PCP is located in our provider database this encounter information will be shared with them immediately following your visit.   A [facility] MyChart account gives you access to today's visit and all your visits, tests, and labs performed at [facility] " click here if you don't have a [facility] MyChart account or go to [email or web address]  Consent: (Patient) Maureen Cameron provided verbal consent for this virtual visit at the beginning of the encounter.  Current Medications:  Current Outpatient Medications:    amoxicillin-clavulanate (AUGMENTIN) 875-125 MG tablet, Take 1 tablet by mouth 2 (two) times daily for 7 days., Disp: 14 tablet, Rfl: 0   cyclobenzaprine (FLEXERIL) 5 MG tablet, Take 1 tablet (5 mg total) by mouth 3 (three) times daily as needed., Disp: 10 tablet, Rfl: 0   norgestimate-ethinyl estradiol (ORTHO-CYCLEN) 0.25-35 MG-MCG tablet, Take 1 tablet by mouth daily., Disp: 28 tablet, Rfl: 11   Medications ordered in this encounter:  Meds ordered this encounter  Medications   amoxicillin-clavulanate (AUGMENTIN) 875-125 MG tablet    Sig: Take 1 tablet by mouth 2 (two) times daily for 7 days.    Dispense:  14 tablet    Refill:  0    Supervising Provider:   Merrilee Jansky [[id number]]     *If you need refills on other medications prior to your next appointment, please contact your pharmacy*  Follow-Up: Call back or seek an in-person evaluation if the symptoms worsen or if the condition fails to improve as anticipated.  [facility] [phone]  Other Instructions  Follow up in person if you develop a fever, trouble swallowing, opening your mouth, facial swelling, or any lumps or bumps   Complete the full course of medication    If you have been instructed to have an in-person evaluation  today at a local Urgent Care facility, please use the link below. It will take you to a list of all of our available [facility], including address, phone number and hours of operation. Please do not delay care.  [facility]  If you or a family member do not have a primary care provider, use the link below to schedule a visit and establish care. When you choose a [facility] primary care physician or advanced practice provider, you gain a long-term partner in health. Find a Primary Care Provider  Learn more about [facility]'s in-office and virtual care options: [facility] - Get Care Now

## 2023-11-22 LAB — PREGNANCY, URINE: Preg Test, Ur: POSITIVE

## 2023-11-29 ENCOUNTER — Other Ambulatory Visit: Payer: Self-pay

## 2023-11-29 ENCOUNTER — Encounter: Payer: Self-pay | Admitting: Physician Assistant

## 2023-11-29 ENCOUNTER — Emergency Department (HOSPITAL_BASED_OUTPATIENT_CLINIC_OR_DEPARTMENT_OTHER): Payer: No Typology Code available for payment source

## 2023-11-29 ENCOUNTER — Emergency Department (HOSPITAL_BASED_OUTPATIENT_CLINIC_OR_DEPARTMENT_OTHER)
Admission: EM | Admit: 2023-11-29 | Discharge: 2023-11-29 | Disposition: A | Discharge status: Home / Self Care | Payer: No Typology Code available for payment source | Attending: Emergency Medicine | Admitting: Emergency Medicine

## 2023-11-29 ENCOUNTER — Telehealth: Payer: Self-pay

## 2023-11-29 DIAGNOSIS — N390 Urinary tract infection, site not specified: Secondary | ICD-10-CM | POA: Insufficient documentation

## 2023-11-29 DIAGNOSIS — O26851 Spotting complicating pregnancy, first trimester: Secondary | ICD-10-CM | POA: Diagnosis present

## 2023-11-29 DIAGNOSIS — N939 Abnormal uterine and vaginal bleeding, unspecified: Secondary | ICD-10-CM

## 2023-11-29 DIAGNOSIS — Z3A01 Less than 8 weeks gestation of pregnancy: Secondary | ICD-10-CM | POA: Diagnosis not present

## 2023-11-29 LAB — URINALYSIS, MICROSCOPIC (REFLEX): WBC, UA: >50 WBC/hpf (ref 0–5)

## 2023-11-29 LAB — HCG, QUANTITATIVE, PREGNANCY: hCG, Beta Chain, Quant, S: 160054 m[IU]/mL — ABNORMAL HIGH (ref <5)

## 2023-11-29 LAB — CBC
HCT: 40.4 % (ref 36.0–46.0)
Hemoglobin: 13.7 g/dL (ref 12.0–15.0)
MCH: 25 pg — ABNORMAL LOW (ref 26.0–34.0)
MCHC: 33.9 g/dL (ref 30.0–36.0)
MCV: 73.7 fL — ABNORMAL LOW (ref 80.0–100.0)
Platelets: 268 10*3/uL (ref 150–400)
RBC: 5.48 MIL/uL — ABNORMAL HIGH (ref 3.87–5.11)
RDW: 14.1 % (ref 11.5–15.5)
WBC: 5.3 10*3/uL (ref 4.0–10.5)
nRBC: 0 % (ref 0.0–0.2)

## 2023-11-29 LAB — URINALYSIS, ROUTINE W REFLEX MICROSCOPIC
Bilirubin Urine: NEGATIVE
Glucose, UA: NEGATIVE mg/dL
Ketones, ur: 40 mg/dL — AB
Nitrite: NEGATIVE
Protein, ur: 30 mg/dL — AB
Specific Gravity, Urine: 1.02 (ref 1.005–1.030)
pH: 6.5 (ref 5.0–8.0)

## 2023-11-29 LAB — ABO/RH: ABO/RH(D): AB POS

## 2023-11-29 MED ORDER — CEPHALEXIN 500 MG PO CAPS: 500.0000 mg | ORAL_CAPSULE | Freq: Three times a day (TID) | ORAL | 0 refills | Status: AC

## 2023-11-29 NOTE — ED Notes (Signed)

## 2023-11-29 NOTE — ED Notes (Signed)
Cannot discharge, registration in chart.

## 2023-11-29 NOTE — ED Provider Notes (Signed)
[place] EMERGENCY DEPARTMENT AT [facility] [place] Provider Note   CSN: [id number] Arrival date & time: 11/29/23  1204     History No chief complaint on file.   HPI Maureen Cameron is a 33 y.o. female presenting for chief complaint of vaginal spotting.  Otherwise healthy 33 year old female.  [redacted] weeks pregnant.  Follows with OB in 2 weeks.  Denies any pain.  Otherwise ambulatory tolerating p.o. intake..   Patient's recorded medical, surgical, social, medication list and allergies were reviewed in the Snapshot window as part of the initial history.   Review of Systems   Review of Systems  Constitutional:  Negative for chills and fever.  HENT:  Negative for ear pain and sore throat.   Eyes:  Negative for pain and visual disturbance.  Respiratory:  Negative for cough and shortness of breath.   Cardiovascular:  Negative for chest pain and palpitations.  Gastrointestinal:  Negative for abdominal pain and vomiting.  Genitourinary:  Positive for vaginal bleeding. Negative for dysuria and hematuria.  Musculoskeletal:  Negative for arthralgias and back pain.  Skin:  Negative for color change and rash.  Neurological:  Negative for seizures and syncope.  All other systems reviewed and are negative.   Physical Exam Updated Vital Signs BP 113/73 (BP Location: Left Arm)   Pulse 100   Temp 98.3 F (36.8 C) (Oral)   Resp 20   SpO2 100%  Physical Exam Vitals and nursing note reviewed.  Constitutional:      General: She is not in acute distress.    Appearance: She is well-developed.  HENT:     Head: Normocephalic and atraumatic.  Eyes:     Conjunctiva/sclera: Conjunctivae normal.  Cardiovascular:     Rate and Rhythm: Normal rate and regular rhythm.     Heart sounds: No murmur heard. Pulmonary:     Effort: Pulmonary effort is normal. No respiratory distress.     Breath sounds: Normal breath sounds.  Abdominal:     General: There is no distension.     Palpations: Abdomen is  soft.     Tenderness: There is no abdominal tenderness. There is no right CVA tenderness or left CVA tenderness.  Musculoskeletal:        General: No swelling or tenderness. Normal range of motion.     Cervical back: Neck supple.  Skin:    General: Skin is warm and dry.  Neurological:     General: No focal deficit present.     Mental Status: She is alert and oriented to person, place, and time. Mental status is at baseline.     Cranial Nerves: No cranial nerve deficit.      ED Course/ Medical Decision Making/ A&P    Procedures Procedures   Medications Ordered in ED Medications - No data to display  Medical Decision Making:   33 year old female with first trimester vaginal bleeding.  Overall well-appearing no acute distress. No bleeding at this time.  Ultrasound performed demonstrates no acute pathology.  Hemoglobin reassuring.  Patient does have a UTI we will treat with Keflex.  Does not appear to be ectopic pregnancy, ovarian torsion or any other complication.  Strict follow-up with gynecology within 48 hours strict return precautions reinforced patient expressed understanding.  Disposition:  I have considered need for hospitalization, however, considering all of the above, I believe this patient is stable for discharge at this time.  Patient/family educated about specific return precautions for given chief complaint and symptoms.  Patient/family educated about  follow-up with PCP.     Patient/family expressed understanding of return precautions and need for follow-up. Patient spoken to regarding all imaging and laboratory results and appropriate follow up for these results. All education provided in verbal form with additional information in written form. Time was allowed for answering of patient questions. Patient discharged.    Emergency Department Medication Summary:   Medications - No data to display      Clinical Impression:  1. Urinary tract infection without  hematuria, site unspecified   2. Vaginal bleeding      Discharge   Final Clinical Impression(s) / ED Diagnoses Final diagnoses:  Urinary tract infection without hematuria, site unspecified  Vaginal bleeding    Rx / DC Orders ED Discharge Orders          Ordered    cephALEXin (KEFLEX) 500 MG capsule  3 times daily        11/29/23 1800              Glyn Ade, MD 11/29/23 2207

## 2023-11-29 NOTE — ED Triage Notes (Signed)
Pt reports that she is currently [redacted] weeks pregnant and having cramping and brown dischargeX 2 days. Pt reports hx of miscarriage at 13 weeks.

## 2023-12-09 ENCOUNTER — Ambulatory Visit
Admission: EM | Admit: 2023-12-09 | Discharge: 2023-12-09 | Disposition: A | Discharge status: Home / Self Care | Payer: No Typology Code available for payment source | Attending: Family Medicine | Admitting: Family Medicine

## 2023-12-09 DIAGNOSIS — J209 Acute bronchitis, unspecified: Secondary | ICD-10-CM | POA: Diagnosis not present

## 2023-12-09 MED ORDER — AMOXICILLIN 875 MG PO TABS: 875.0000 mg | ORAL_TABLET | Freq: Two times a day (BID) | ORAL | 0 refills | Status: AC

## 2023-12-09 NOTE — ED Provider Notes (Signed)
 [facility]    CSN: [id number] Arrival date & time: 12/09/23  1138      History   Chief Complaint Chief Complaint  Patient presents with   Cough   Otalgia    HPI Maureen Cameron is a 33 y.o. female  presents for evaluation of URI symptoms for 2 weeks. Patient reports associated symptoms of dry cough with some left ear pressure. Denies N/V/D, fevers, sore throat, body aches, shortness of breath. Patient does not have a hx of asthma. Patient is not an active smoker.   Reports has been had similar symptoms.  Patient is currently pregnant in first trimester.  Pt has taken a test and OTC for symptoms. Pt has no other concerns at this time.    Cough Associated symptoms: ear pain   Otalgia Associated symptoms: congestion and cough     Past Medical History:  Diagnosis Date   Gestational diabetes    History of  gestational diabetes 11/18/2015    Patient Active Problem List   Diagnosis Date Noted   IUFD at less than 20 weeks of gestation 11/24/2022   History of gestational diabetes mellitus (GDM), not currently pregnant 07/02/2020   Alpha thalassemia silent carrier 06/04/2020   History of cesarean section 05/12/2020   Maternal varicella, non-immune 10/04/2015    Past Surgical History:  Procedure Laterality Date   CESAREAN SECTION N/A 11/19/2015   Procedure: CESAREAN SECTION;  Surgeon: Reva Bores, MD;  Location: [facility];  Service: Obstetrics;  Laterality: N/A;   CESAREAN SECTION N/A 09/11/2020   Procedure: REPEAT CESAREAN SECTION;  Surgeon: Tereso Newcomer, MD;  [facility]: [facility];  Service: Obstetrics;  Laterality: N/A;    OB History     Gravida  4   Para  2   Term  2   Preterm  0   AB  0   Living  2      SAB  0   IAB  0   Ectopic  0   Multiple  0   Live Births  2            Home Medications    Prior to Admission medications   Medication Sig Start Date End Date Taking? Authorizing Provider  amoxicillin (AMOXIL) 875 MG tablet Take  1 tablet (875 mg total) by mouth 2 (two) times daily for 7 days. 12/09/23 12/16/23 Yes Radford Pax, NP  Prenatal Vit-Fe Fumarate-FA (MULTIVITAMIN-PRENATAL) 27-0.8 MG TABS tablet Take 1 tablet by mouth daily at 12 noon.   Yes [provider]  cyclobenzaprine (FLEXERIL) 5 MG tablet Take 1 tablet (5 mg total) by mouth 3 (three) times daily as needed. 03/25/23   Charlynne Pander, MD  norgestimate-ethinyl estradiol (ORTHO-CYCLEN) 0.25-35 MG-MCG tablet Take 1 tablet by mouth daily. 12/29/22   Marny Lowenstein, PA-C    Family History Family History  Problem Relation Age of Onset   Heart disease Mother    Hypertension Mother    Diabetes Mother    Hypertension Father     Social History Social History   Tobacco Use   Smoking status: Never   Smokeless tobacco: Never  Vaping Use   Vaping status: Never Used  Substance Use Topics   Alcohol use: No   Drug use: No     Allergies   Patient has no known allergies.   Review of Systems Review of Systems  HENT:  Positive for congestion and ear pain.   Respiratory:  Positive for cough.  Physical Exam Triage Vital Signs ED Triage Vitals [12/09/23 1225]  Encounter Vitals Group     BP 124/83     Systolic BP Percentile      Diastolic BP Percentile      Pulse Rate 99     Resp 18     Temp 99.6 F (37.6 C)     Temp Source Oral     SpO2 98 %     Weight      Height      Head Circumference      Peak Flow      Pain Score      Pain Loc      Pain Education      Exclude from Growth Chart    No data found.  Updated Vital Signs BP 124/83 (BP Location: Right Arm)   Pulse 99   Temp 99.6 F (37.6 C) (Oral)   Resp 18   LMP 03/21/2023   SpO2 98%   Breastfeeding No   Visual Acuity Right Eye Distance:   Left Eye Distance:   Bilateral Distance:    Right Eye Near:   Left Eye Near:    Bilateral Near:     Physical Exam Vitals and nursing note reviewed.  Constitutional:      General: She is not in acute distress.     Appearance: She is well-developed. She is not ill-appearing.  HENT:     Head: Normocephalic and atraumatic.     Right Ear: Ear canal normal. A middle ear effusion is present. Tympanic membrane is not erythematous.     Left Ear: Tympanic membrane and ear canal normal.     Nose: Congestion present.     Mouth/Throat:     Mouth: Mucous membranes are moist.     Pharynx: Oropharynx is clear. Uvula midline. No posterior oropharyngeal erythema.     Tonsils: No tonsillar exudate or tonsillar abscesses.  Eyes:     Conjunctiva/sclera: Conjunctivae normal.     Pupils: Pupils are equal, round, and reactive to light.  Cardiovascular:     Rate and Rhythm: Normal rate and regular rhythm.     Heart sounds: Normal heart sounds.  Pulmonary:     Effort: Pulmonary effort is normal. No respiratory distress.     Breath sounds: Normal breath sounds. No stridor. No wheezing, rhonchi or rales.  Musculoskeletal:     Cervical back: Normal range of motion and neck supple.  Lymphadenopathy:     Cervical: No cervical adenopathy.  Skin:    General: Skin is warm and dry.  Neurological:     General: No focal deficit present.     Mental Status: She is alert and oriented to person, place, and time.  Psychiatric:        Mood and Affect: Mood normal.        Behavior: Behavior normal.      UC Treatments / Results  Labs (all labs ordered are listed, but only abnormal results are displayed) Labs Reviewed - No data to display  EKG   Radiology No results found.  Procedures Procedures (including critical care time)  Medications Ordered in UC Medications - No data to display  Initial Impression / Assessment and Plan / UC Course  I have reviewed the triage vital signs and the nursing notes.  Pertinent labs & imaging results that were available during my care of the patient were reviewed by me and considered in my medical decision making (see chart for details).  Reviewed exam and symptoms with  patient.  No red flags.  Given length of symptoms will start amoxicillin.  Will defer chest x-ray given pregnancy.  She can continue over-the-counter guaifenesin as needed for cough.  Tylenol OTC as needed for fever.  Lots of rest and fluids.  Advise follow-up with PCP or OB in 2 days for recheck.  Strict ER precautions reviewed and patient verbalized understanding. Final Clinical Impressions(s) / UC Diagnoses   Final diagnoses:  Acute bronchitis, unspecified organism     Discharge Instructions      Start amoxicillin twice daily for 7 days.  Continue over-the-counter plain Robitussin/guaifenesin.  May take Tylenol as needed.  Lots of fluids and rest.  Please follow-up with your PCP or OB in 2 days for recheck.  Please go to the ER for any worsening symptoms.  I hope you feel better soon!    ED Prescriptions     Medication Sig Dispense Auth. Provider   amoxicillin (AMOXIL) 875 MG tablet Take 1 tablet (875 mg total) by mouth 2 (two) times daily for 7 days. 14 tablet Radford Pax, NP      PDMP not reviewed this encounter.   Radford Pax, NP 12/09/23 1300

## 2023-12-09 NOTE — ED Triage Notes (Signed)
 Pt reports cough x 2 weeks; left era pressure x 2 day. Pt reports she is [redacted] weeks pregnant.

## 2023-12-09 NOTE — Discharge Instructions (Addendum)
 Start amoxicillin twice daily for 7 days.  Continue over-the-counter plain Robitussin/guaifenesin.  May take Tylenol as needed.  Lots of fluids and rest.  Please follow-up with your PCP or OB in 2 days for recheck.  Please go to the ER for any worsening symptoms.  I hope you feel better soon!

## 2023-12-27 ENCOUNTER — Telehealth: Payer: No Typology Code available for payment source

## 2023-12-27 DIAGNOSIS — Z8632 Personal history of gestational diabetes: Secondary | ICD-10-CM

## 2023-12-27 DIAGNOSIS — O0990 Supervision of high risk pregnancy, unspecified, unspecified trimester: Secondary | ICD-10-CM | POA: Insufficient documentation

## 2023-12-27 DIAGNOSIS — Z3490 Encounter for supervision of normal pregnancy, unspecified, unspecified trimester: Secondary | ICD-10-CM

## 2023-12-27 DIAGNOSIS — K5900 Constipation, unspecified: Secondary | ICD-10-CM

## 2023-12-27 MED ORDER — POLYETHYLENE GLYCOL 3350 17 GM/SCOOP PO POWD: 17.0000 g | Freq: Every day | ORAL | 0 refills | Status: DC

## 2023-12-27 NOTE — Progress Notes (Signed)
 New OB Intake  I connected with Maureen Cameron  on 12/27/23 at  1:15 PM EDT by MyChart Video Visit and verified that I am speaking with the correct person using two identifiers. Nurse is located at [facility] and pt is located at home.  I discussed the limitations, risks, security and privacy concerns of performing an evaluation and management service by telephone and the availability of in person appointments. I also discussed with the patient that there may be a patient responsible charge related to this service. The patient expressed understanding and agreed to proceed.  I explained I am completing New OB Intake today. We discussed EDD of 07/17/24 based on LMP and confirmed by [place] at 7w. Pt is [id number]. I reviewed her allergies, medications and Medical/Surgical/OB history.    Patient Active Problem List   Diagnosis Date Noted   Supervision of low-risk pregnancy 12/27/2023   IUFD at less than 20 weeks of gestation 11/24/2022   History of gestational diabetes 07/02/2020   Alpha thalassemia silent carrier 06/04/2020   History of cesarean section 05/12/2020   Maternal varicella, non-immune 10/04/2015   Concerns addressed today Constipation: some N/V but feels this is related to constipation, has tried diet changes like increasing water and fiber, will send OTC Miralax prescription for insurance coverage Vaginal discharge: changing between milky/watery and thick/clumpy, mild itching and odor (smells like urine, but doesn't feel she is leaking). Offered self swab today. Patient would prefer to wait until new OB.  Delivery Plans Plans to deliver at [facility]. Discussed the nature of our practice with multiple providers including residents and students. Due to the size of the practice, the delivering provider may not be the same as those providing prenatal care.   MyChart/Babyscripts MyChart access verified. Babyscripts instructions given and order placed.  Blood Pressure Cuff Has BP cuff.   Anatomy  US Anatomy US scheduled for 02/22/24.  Is patient a CenteringPregnancy candidate?  Declined Declined due to Group setting  Is patient a Mom+Baby Combined Care candidate?  Not a candidate    Is patient a candidate for Babyscripts Optimization? Yes, patient declined; would like more frequent appointments for reassurance  First visit review I reviewed new OB appt with patient. Explained pt will be seen by Scheryl Darter, MD at first visit. Discussed Avelina Laine genetic screening with patient; desires Cindy Hazy, previously completed [facility]. Reviewed with patient she is due for Pap smear. Patient is very concerned about discomfort during pelvic exam and does not want to complete this while pregnant. Explained provider will likely review recommendation during new OB.  Last Pap Diagnosis  Date Value Ref Range Status  04/14/2020   Final   - Negative for intraepithelial lesion or malignancy (NILM)   Marjo Bicker, RN 12/27/2023  2:09 PM

## 2023-12-31 ENCOUNTER — Encounter: Payer: Self-pay | Admitting: *Deleted

## 2023-12-31 DIAGNOSIS — Z3490 Encounter for supervision of normal pregnancy, unspecified, unspecified trimester: Secondary | ICD-10-CM

## 2024-01-03 ENCOUNTER — Ambulatory Visit: Payer: Self-pay | Admitting: Obstetrics & Gynecology

## 2024-01-03 ENCOUNTER — Other Ambulatory Visit (HOSPITAL_COMMUNITY)
Admission: RE | Admit: 2024-01-03 | Discharge: 2024-01-03 | Disposition: A | Discharge status: Home / Self Care | Source: Ambulatory Visit | Attending: Obstetrics & Gynecology | Admitting: Obstetrics & Gynecology

## 2024-01-03 ENCOUNTER — Other Ambulatory Visit: Payer: Self-pay

## 2024-01-03 VITALS — BP 120/73 | HR 83 | Wt 152.0 lb

## 2024-01-03 DIAGNOSIS — Z3491 Encounter for supervision of normal pregnancy, unspecified, first trimester: Secondary | ICD-10-CM | POA: Diagnosis present

## 2024-01-03 DIAGNOSIS — Z3A12 12 weeks gestation of pregnancy: Secondary | ICD-10-CM | POA: Diagnosis not present

## 2024-01-03 DIAGNOSIS — Z8632 Personal history of gestational diabetes: Secondary | ICD-10-CM

## 2024-01-03 NOTE — Progress Notes (Signed)
   PRENATAL VISIT NOTE  Subjective:  Maureen Cameron is a 33 y.o. [id number] at [redacted]w[redacted]d being seen today for ongoing prenatal care.  She is currently monitored for the following issues for this low-risk pregnancy and has Maternal varicella, non-immune; History of cesarean section; Alpha thalassemia silent carrier; History of gestational diabetes; IUFD at less than 20 weeks of gestation; and Supervision of low-risk pregnancy on their problem list.  Patient reports nausea and constipation.  Contractions: Not present. Vag. Bleeding: None.  Movement: Absent. Denies leaking of fluid.   The following portions of the patient's history were reviewed and updated as appropriate: allergies, current medications, past family history, past medical history, past social history, past surgical history and problem list.   Objective:   Vitals:   01/03/24 1453  BP: 120/73  Pulse: 83  Weight: 152 lb (68.9 kg)    Fetal Status: Fetal Heart Rate (bpm): 160   Movement: Absent     General:  Alert, oriented and cooperative. Patient is in no acute distress.  Skin: Skin is warm and dry. No rash noted.   Cardiovascular: Normal heart rate noted  Respiratory: Normal respiratory effort, no problems with respiration noted  Abdomen: Soft, gravid, appropriate for gestational age.  Pain/Pressure: Present     Pelvic: Cervical exam deferred        Extremities: Normal range of motion.  Edema: None  Mental Status: Normal mood and affect. Normal behavior. Normal judgment and thought content.   Assessment and Plan:  Pregnancy: [id number] at [redacted]w[redacted]d 1. Encounter for supervision of low-risk pregnancy in first trimester (Primary) - Continue routine prenatal monitoring. - CBC/D/Plt+RPR+Rh+ABO+RubIgG... - Culture, OB Urine - GC/Chlamydia probe amp ([place])not at [facility] - Hemoglobin A1c - PANORAMA PRENATAL TEST  Preterm labor symptoms and general obstetric precautions including but not limited to vaginal bleeding, contractions,  leaking of fluid and fetal movement were reviewed in detail with the patient. Please refer to After Visit Summary for other counseling recommendations.   Return in about 4 weeks (around 01/31/2024).  Future Appointments  Date Time Provider Department Center  02/22/2024  2:15 PM [facility] NURSE [facility]  02/22/2024  2:30 PM [facility] US4 [facility]    Kerrin Mo, Medical Student

## 2024-01-03 NOTE — Progress Notes (Signed)
  Subjective:nervous about having a pelvic exam at this time    Maureen Cameron is a [id number] [redacted]w[redacted]d being seen today for her first obstetrical visit.  Her obstetrical history is significant for  gestational diabetes history . Patient does intend to breast feed. Pregnancy history fully reviewed.  Patient reports nausea.  Vitals:   01/03/24 1453  BP: 120/73  Pulse: 83  Weight: 152 lb (68.9 kg)    HISTORY: OB History  Gravida Para Term Preterm AB Living  4 2 2  0 1 2  SAB IAB Ectopic Multiple Live Births  1 0 0 0 2    # Outcome Date GA Lbr Len/2nd Weight Sex Type Anes PTL Lv  4 Current           3 SAB 11/25/22 [redacted]w[redacted]d       FD  2 Term 09/11/20 [redacted]w[redacted]d  8 lb 2.3 oz (3.695 kg) F CS-LTranv Spinal  LIV     Complications: Gestational diabetes  1 Term 11/19/15 [redacted]w[redacted]d 26:28 / 09:02 7 lb 11.1 oz (3.49 kg) F CS-LTranv EPI  LIV     Complications: Gestational diabetes   Past Medical History:  Diagnosis Date   Gestational diabetes    Past Surgical History:  Procedure Laterality Date   CESAREAN SECTION N/A 11/19/2015   Procedure: CESAREAN SECTION;  Surgeon: Reva Bores, MD;  Location: [facility];  Service: Obstetrics;  Laterality: N/A;   CESAREAN SECTION N/A 09/11/2020   Procedure: REPEAT CESAREAN SECTION;  Surgeon: Tereso Newcomer, MD;  Location: [facility];  Service: Obstetrics;  Laterality: N/A;   Family History  Problem Relation Age of Onset   Heart disease Mother    Hypertension Mother    Diabetes Mother    Hypertension Father      Exam    Uterus:   12  Pelvic Exam: Patient declines pelvic exam                              System: Breast:  deferred   Skin: normal coloration and turgor, no rashes    Neurologic: oriented, normal mood   Extremities: normal strength, tone, and muscle mass   HEENT PERRLA   Mouth/Teeth mucous membranes moist, pharynx normal without lesions and dental hygiene good   Neck supple and no masses   Cardiovascular: regular rate and rhythm, no murmurs  or gallops   Respiratory:  appears well, vitals normal, no respiratory distress, acyanotic, normal RR, chest clear, no wheezing, crepitations, rhonchi, normal symmetric air entry   Abdomen: soft, non-tender; bowel sounds normal; no masses,  no organomegaly   Urinary:       Assessment:    Pregnancy: [id number] Patient Active Problem List   Diagnosis Date Noted   Supervision of low-risk pregnancy 12/27/2023   IUFD at less than 20 weeks of gestation 11/24/2022   History of gestational diabetes 07/02/2020   Alpha thalassemia silent carrier 06/04/2020   History of cesarean section 05/12/2020   Maternal varicella, non-immune 10/04/2015        Plan:     Initial labs drawn. Prenatal vitamins. Problem list reviewed and updated. Genetic Screening discussed : ordered.  Ultrasound discussed; fetal survey: ordered.  Follow up in 4 weeks. 50% of 30 min visit spent on counseling and coordination of care.  Still needs pap to be up to date   Scheryl Darter 01/03/2024

## 2024-01-04 LAB — GC/CHLAMYDIA PROBE AMP (~~LOC~~) NOT AT ARMC
Chlamydia: NEGATIVE
Comment: NEGATIVE
Comment: NORMAL
Neisseria Gonorrhea: NEGATIVE

## 2024-01-05 LAB — CBC/D/PLT+RPR+RH+ABO+RUBIGG...
Antibody Screen: NEGATIVE
Basophils Absolute: 0 10*3/uL (ref 0.0–0.2)
Basos: 0 %
EOS (ABSOLUTE): 0.1 10*3/uL (ref 0.0–0.4)
Eos: 1 %
HCV Ab: NONREACTIVE
HIV Screen 4th Generation wRfx: NONREACTIVE
Hematocrit: 38.8 % (ref 34.0–46.6)
Hemoglobin: 12.8 g/dL (ref 11.1–15.9)
Hepatitis B Surface Ag: NEGATIVE
Immature Grans (Abs): 0.1 10*3/uL (ref 0.0–0.1)
Immature Granulocytes: 1 %
Lymphocytes Absolute: 1.7 10*3/uL (ref 0.7–3.1)
Lymphs: 21 %
MCH: 25.7 pg — ABNORMAL LOW (ref 26.6–33.0)
MCHC: 33 g/dL (ref 31.5–35.7)
MCV: 78 fL — ABNORMAL LOW (ref 79–97)
Monocytes Absolute: 0.4 10*3/uL (ref 0.1–0.9)
Monocytes: 5 %
Neutrophils Absolute: 5.5 10*3/uL (ref 1.4–7.0)
Neutrophils: 72 %
Platelets: 278 10*3/uL (ref 150–450)
RBC: 4.98 x10E6/uL (ref 3.77–5.28)
RDW: 14.9 % (ref 11.7–15.4)
RPR Ser Ql: NONREACTIVE
Rh Factor: POSITIVE
Rubella Antibodies, IGG: 1.15 {index} (ref >0.99)
WBC: 7.8 10*3/uL (ref 3.4–10.8)

## 2024-01-05 LAB — HEMOGLOBIN A1C
Est. average glucose Bld gHb Est-mCnc: 94 mg/dL
Hgb A1c MFr Bld: 4.9 % (ref 4.8–5.6)

## 2024-01-05 LAB — HCV INTERPRETATION

## 2024-01-07 ENCOUNTER — Encounter: Payer: Self-pay | Admitting: Obstetrics & Gynecology

## 2024-01-09 ENCOUNTER — Encounter: Payer: Self-pay | Admitting: *Deleted

## 2024-01-10 LAB — PANORAMA PRENATAL TEST FULL PANEL:PANORAMA TEST PLUS 5 ADDITIONAL MICRODELETIONS: FETAL FRACTION: 7.1

## 2024-02-04 ENCOUNTER — Ambulatory Visit: Admitting: Advanced Practice Midwife

## 2024-02-04 ENCOUNTER — Other Ambulatory Visit: Payer: Self-pay

## 2024-02-04 ENCOUNTER — Encounter: Payer: Self-pay | Admitting: Family Medicine

## 2024-02-04 VITALS — BP 126/77 | HR 84 | Wt 154.1 lb

## 2024-02-04 DIAGNOSIS — Z3A16 16 weeks gestation of pregnancy: Secondary | ICD-10-CM

## 2024-02-04 DIAGNOSIS — R519 Headache, unspecified: Secondary | ICD-10-CM | POA: Diagnosis not present

## 2024-02-04 DIAGNOSIS — Z3491 Encounter for supervision of normal pregnancy, unspecified, first trimester: Secondary | ICD-10-CM

## 2024-02-04 DIAGNOSIS — Z8632 Personal history of gestational diabetes: Secondary | ICD-10-CM

## 2024-02-04 DIAGNOSIS — O26892 Other specified pregnancy related conditions, second trimester: Secondary | ICD-10-CM | POA: Diagnosis not present

## 2024-02-04 DIAGNOSIS — K0889 Other specified disorders of teeth and supporting structures: Secondary | ICD-10-CM

## 2024-02-04 NOTE — Progress Notes (Signed)
   PRENATAL VISIT NOTE  Subjective:  Maureen Cameron is a 33 y.o. [id number] at [redacted]w[redacted]d being seen today for ongoing prenatal care.  She is currently monitored for the following issues for this low-risk pregnancy and has Maternal varicella, non-immune; History of cesarean section; Alpha thalassemia silent carrier; History of gestational diabetes; IUFD at less than 20 weeks of gestation; and Supervision of low-risk pregnancy on their problem list.  Patient reports  tooth pain/headaches .  Contractions: Not present.  .  Movement: Absent. Denies leaking of fluid.   The following portions of the patient's history were reviewed and updated as appropriate: allergies, current medications, past family history, past medical history, past social history, past surgical history and problem list.   Objective:   Vitals:   02/04/24 1541  BP: 126/77  Pulse: 84  Weight: 154 lb 1 oz (69.9 kg)    Fetal Status: Fetal Heart Rate (bpm): 153   Movement: Absent     General:  Alert, oriented and cooperative. Patient is in no acute distress.  Skin: Skin is warm and dry. No rash noted.   Cardiovascular: Normal heart rate noted  Respiratory: Normal respiratory effort, no problems with respiration noted  Abdomen: Soft, gravid, appropriate for gestational age.  Pain/Pressure: Absent     Pelvic: Cervical exam deferred        Extremities: Normal range of motion.  Edema: None  Mental Status: Normal mood and affect. Normal behavior. Normal judgment and thought content.   Assessment and Plan:  Pregnancy: [id number] at [redacted]w[redacted]d 1. Encounter for supervision of low-risk pregnancy in first trimester (Primary) --Anticipatory guidance about next visits/weeks of pregnancy given.  - AFP, Serum, Open Spina Bifida  2. History of gestational diabetes --Early A1C 4.9  3. [redacted] weeks gestation of pregnancy  4. Tooth pain --Pt with cracked tooth on left lower side, reports tooth pain and related h/a on left side.   --Pt does not have  dental provider, has dental insurance separate from private medical insurance.  --Recommend calling in-network providers for soonest appointment --Dental letter provided  5. Pregnancy headache in second trimester --Pt thinks h/a related to tooth, Tylenol  helps with pain.  F/U with dentist. --Go to MAU/ED for emergencies  Preterm labor symptoms and general obstetric precautions including but not limited to vaginal bleeding, contractions, leaking of fluid and fetal movement were reviewed in detail with the patient. Please refer to After Visit Summary for other counseling recommendations.   Return in about 4 weeks (around 03/03/2024) for LOB, As scheduled.  Future Appointments  Date Time Provider Department Center  02/22/2024  2:15 PM [facility] NURSE [facility]  02/22/2024  2:30 PM [facility] US4 [facility]  03/03/2024  3:15 PM Marcelino Sera [facility]  03/31/2024  3:15 PM Left[place]-Kirby, Darren Em, CNM [facility]    Arlester Bence, CNM

## 2024-02-06 LAB — AFP, SERUM, OPEN SPINA BIFIDA
AFP MoM: 1.46
AFP Value: 51.6 ng/mL
Gest. Age on Collection Date: 16.6 wk
Maternal Age At EDD: 34.8 a
OSBR Risk 1 IN: 3048
Test Results:: NEGATIVE
Weight: 154 [lb_av]

## 2024-02-22 ENCOUNTER — Ambulatory Visit: Admitting: *Deleted

## 2024-02-22 ENCOUNTER — Other Ambulatory Visit: Payer: Self-pay | Admitting: *Deleted

## 2024-02-22 ENCOUNTER — Ambulatory Visit: Attending: Obstetrics & Gynecology

## 2024-02-22 ENCOUNTER — Ambulatory Visit (HOSPITAL_BASED_OUTPATIENT_CLINIC_OR_DEPARTMENT_OTHER): Admitting: Obstetrics

## 2024-02-22 VITALS — BP 123/67 | HR 80

## 2024-02-22 DIAGNOSIS — O321XX0 Maternal care for breech presentation, not applicable or unspecified: Secondary | ICD-10-CM | POA: Insufficient documentation

## 2024-02-22 DIAGNOSIS — O09292 Supervision of pregnancy with other poor reproductive or obstetric history, second trimester: Secondary | ICD-10-CM | POA: Insufficient documentation

## 2024-02-22 DIAGNOSIS — Z369 Encounter for antenatal screening, unspecified: Secondary | ICD-10-CM | POA: Insufficient documentation

## 2024-02-22 DIAGNOSIS — O358XX0 Maternal care for other (suspected) fetal abnormality and damage, not applicable or unspecified: Secondary | ICD-10-CM | POA: Diagnosis not present

## 2024-02-22 DIAGNOSIS — Z3A19 19 weeks gestation of pregnancy: Secondary | ICD-10-CM

## 2024-02-22 DIAGNOSIS — Z8632 Personal history of gestational diabetes: Secondary | ICD-10-CM | POA: Diagnosis not present

## 2024-02-22 DIAGNOSIS — Z3689 Encounter for other specified antenatal screening: Secondary | ICD-10-CM | POA: Diagnosis not present

## 2024-02-22 DIAGNOSIS — O34219 Maternal care for unspecified type scar from previous cesarean delivery: Secondary | ICD-10-CM | POA: Diagnosis not present

## 2024-02-22 DIAGNOSIS — Z3490 Encounter for supervision of normal pregnancy, unspecified, unspecified trimester: Secondary | ICD-10-CM | POA: Insufficient documentation

## 2024-02-22 DIAGNOSIS — O021 Missed abortion: Secondary | ICD-10-CM

## 2024-02-22 DIAGNOSIS — Z98891 History of uterine scar from previous surgery: Secondary | ICD-10-CM

## 2024-02-22 DIAGNOSIS — O24112 Pre-existing diabetes mellitus, type 2, in pregnancy, second trimester: Secondary | ICD-10-CM | POA: Diagnosis not present

## 2024-02-22 DIAGNOSIS — Z3492 Encounter for supervision of normal pregnancy, unspecified, second trimester: Secondary | ICD-10-CM | POA: Insufficient documentation

## 2024-02-22 DIAGNOSIS — E119 Type 2 diabetes mellitus without complications: Secondary | ICD-10-CM | POA: Insufficient documentation

## 2024-02-22 NOTE — Progress Notes (Signed)
 MFM Consult Note  Maureen Cameron is currently at 19 weeks and 1 day.  She was seen for a detailed fetal anatomy scan due to a prior IUFD at 18 weeks.  Her past pregnancies have also been complicated by gestational diabetes.  She denies any problems in her current pregnancy.    She had a cell free DNA test earlier in her pregnancy which indicated a low risk for trisomy 42, 20, and 13. A female fetus is predicted.   She was informed that the fetal growth and amniotic fluid level were appropriate for her gestational age.   There were no obvious fetal anomalies noted on today's ultrasound exam.  However, today's exam was limited due to the fetal position.  The patient was informed that anomalies may be missed due to technical limitations. If the fetus is in a suboptimal position or maternal habitus is increased, visualization of the fetus in the maternal uterus may be impaired.  A follow-up exam was scheduled in 6 weeks to reassess the views of the fetal anatomy and to follow the fetal growth.    The patient stated that all of her questions were answered today.  A total of 30 minutes was spent counseling and coordinating the care for this patient.  Greater than 50% of the time was spent in direct face-to-face contact.

## 2024-03-03 ENCOUNTER — Ambulatory Visit: Admitting: Advanced Practice Midwife

## 2024-03-03 ENCOUNTER — Other Ambulatory Visit: Payer: Self-pay

## 2024-03-03 VITALS — BP 112/70 | HR 96 | Wt 160.0 lb

## 2024-03-03 DIAGNOSIS — Z3492 Encounter for supervision of normal pregnancy, unspecified, second trimester: Secondary | ICD-10-CM | POA: Diagnosis not present

## 2024-03-03 DIAGNOSIS — O021 Missed abortion: Secondary | ICD-10-CM

## 2024-03-03 DIAGNOSIS — Z3A20 20 weeks gestation of pregnancy: Secondary | ICD-10-CM

## 2024-03-03 DIAGNOSIS — Z8632 Personal history of gestational diabetes: Secondary | ICD-10-CM | POA: Diagnosis not present

## 2024-03-03 DIAGNOSIS — K0889 Other specified disorders of teeth and supporting structures: Secondary | ICD-10-CM

## 2024-03-03 DIAGNOSIS — Z3491 Encounter for supervision of normal pregnancy, unspecified, first trimester: Secondary | ICD-10-CM

## 2024-03-03 NOTE — Progress Notes (Signed)
   PRENATAL VISIT NOTE  Subjective:  Maureen Cameron is a 33 y.o. [id number] at [redacted]w[redacted]d being seen today for ongoing prenatal care.  She is currently monitored for the following issues for this low-risk pregnancy and has Maternal varicella, non-immune; History of cesarean section; Alpha thalassemia silent carrier; History of gestational diabetes; IUFD at less than 20 weeks of gestation; and Supervision of low-risk pregnancy on their problem list.  Patient reports no complaints.  Contractions: Not present. Vag. Bleeding: None.  Movement: Present. Denies leaking of fluid.   The following portions of the patient's history were reviewed and updated as appropriate: allergies, current medications, past family history, past medical history, past social history, past surgical history and problem list.   Objective:    Vitals:   03/03/24 1523  BP: 112/70  Pulse: 96  Weight: 160 lb (72.6 kg)    Fetal Status:  Fetal Heart Rate (bpm): 158   Movement: Present    General: Alert, oriented and cooperative. Patient is in no acute distress.  Skin: Skin is warm and dry. No rash noted.   Cardiovascular: Normal heart rate noted  Respiratory: Normal respiratory effort, no problems with respiration noted  Abdomen: Soft, gravid, appropriate for gestational age.  Pain/Pressure: Absent     Pelvic: Cervical exam deferred        Extremities: Normal range of motion.  Edema: None  Mental Status: Normal mood and affect. Normal behavior. Normal judgment and thought content.   Assessment and Plan:  Pregnancy: [id number] at [redacted]w[redacted]d 1. [redacted] weeks gestation of pregnancy (Primary)   2. Encounter for supervision of low-risk pregnancy in first trimester --Anticipatory guidance about next visits/weeks of pregnancy given.   3. History of gestational diabetes --A1C 4.9, 2 hour GTT at 28 weeks  4. Tooth pain --Discussed at previous visit. Now resolved.   5. IUFD at less than 20 weeks of gestation --US  follow up with  MFM  Preterm labor symptoms and general obstetric precautions including but not limited to vaginal bleeding, contractions, leaking of fluid and fetal movement were reviewed in detail with the patient. Please refer to After Visit Summary for other counseling recommendations.   Return in about 4 weeks (around 03/31/2024) for LOB.  Future Appointments  Date Time Provider Department Center  03/31/2024  3:15 PM Marcelino Sera [facility]  04/04/2024  3:00 PM [facility] PROVIDER 1 [facility]  04/04/2024  3:30 PM [facility] US5 [facility]    Arlester Bence, CNM

## 2024-03-19 ENCOUNTER — Encounter: Payer: Self-pay | Admitting: *Deleted

## 2024-03-19 ENCOUNTER — Encounter: Payer: Self-pay | Admitting: Advanced Practice Midwife

## 2024-03-20 ENCOUNTER — Encounter: Payer: Self-pay | Admitting: Family Medicine

## 2024-03-20 ENCOUNTER — Encounter: Payer: Self-pay | Admitting: *Deleted

## 2024-03-31 ENCOUNTER — Other Ambulatory Visit: Payer: Self-pay

## 2024-03-31 ENCOUNTER — Ambulatory Visit: Admitting: Obstetrics and Gynecology

## 2024-03-31 VITALS — BP 105/71 | HR 90 | Wt 165.0 lb

## 2024-03-31 DIAGNOSIS — O34219 Maternal care for unspecified type scar from previous cesarean delivery: Secondary | ICD-10-CM | POA: Diagnosis not present

## 2024-03-31 DIAGNOSIS — O99112 Other diseases of the blood and blood-forming organs and certain disorders involving the immune mechanism complicating pregnancy, second trimester: Secondary | ICD-10-CM | POA: Diagnosis not present

## 2024-03-31 DIAGNOSIS — D563 Thalassemia minor: Secondary | ICD-10-CM

## 2024-03-31 DIAGNOSIS — Z3A24 24 weeks gestation of pregnancy: Secondary | ICD-10-CM

## 2024-03-31 DIAGNOSIS — O09892 Supervision of other high risk pregnancies, second trimester: Secondary | ICD-10-CM

## 2024-03-31 DIAGNOSIS — O09899 Supervision of other high risk pregnancies, unspecified trimester: Secondary | ICD-10-CM

## 2024-03-31 DIAGNOSIS — Z3492 Encounter for supervision of normal pregnancy, unspecified, second trimester: Secondary | ICD-10-CM

## 2024-03-31 DIAGNOSIS — Z98891 History of uterine scar from previous surgery: Secondary | ICD-10-CM

## 2024-03-31 DIAGNOSIS — Z8632 Personal history of gestational diabetes: Secondary | ICD-10-CM

## 2024-03-31 DIAGNOSIS — Z2839 Other underimmunization status: Secondary | ICD-10-CM

## 2024-03-31 NOTE — Progress Notes (Signed)
   PRENATAL VISIT NOTE  Subjective:  Maureen Cameron is a 33 y.o. [id number] at [redacted]w[redacted]d being seen today for ongoing prenatal care.  She is currently monitored for the following issues for this high-risk pregnancy and has Maternal varicella, non-immune; History of 2 cesarean sections; Alpha thalassemia silent carrier; History of gestational diabetes; IUFD at less than 20 weeks of gestation; and Supervision of low-risk pregnancy on their problem list.  Patient doing well with no acute concerns today. She reports no complaints.  Contractions: Not present. Vag. Bleeding: None.  Movement: Present. Denies leaking of fluid.   The following portions of the patient's history were reviewed and updated as appropriate: allergies, current medications, past family history, past medical history, past social history, past surgical history and problem list. Problem list updated.  Objective:   Vitals:   03/31/24 1527  BP: 105/71  Pulse: 90  Weight: 165 lb (74.8 kg)    Fetal Status: Fetal Heart Rate (bpm): 153   Movement: Present     General:  Alert, oriented and cooperative. Patient is in no acute distress.  Skin: Skin is warm and dry. No rash noted.   Cardiovascular: Normal heart rate noted  Respiratory: Normal respiratory effort, no problems with respiration noted  Abdomen: Soft, gravid, appropriate for gestational age.  Pain/Pressure: Present (pressure)     Pelvic: Cervical exam deferred        Extremities: Normal range of motion.  Edema: Trace  Mental Status:  Normal mood and affect. Normal behavior. Normal judgment and thought content.   Assessment and Plan:  Pregnancy: [id number] at [redacted]w[redacted]d  1. [redacted] weeks gestation of pregnancy (Primary)   2. Encounter for supervision of low-risk pregnancy in second trimester Continue routine prenatal care  3. Maternal varicella, non-immune Offer vaccine post delivery  4. History of gestational diabetes 2 hour GTT next visit  5. History of 2 cesarean  sections Discussed VBAC versus repeat c section.  Pt was unaware she could get BTL at time of c section.  Will discuss with spouse .  VBAC form given to review.  6. Alpha thalassemia silent carrier   Preterm labor symptoms and general obstetric precautions including but not limited to vaginal bleeding, contractions, leaking of fluid and fetal movement were reviewed in detail with the patient.  Please refer to After Visit Summary for other counseling recommendations.   Return in about 3 weeks (around 04/21/2024) for ROB, in person, 2 hr GTT, 3rd trim labs.   Avie Boeck, MD Faculty Attending Center for [facility]

## 2024-04-04 ENCOUNTER — Ambulatory Visit: Admitting: Obstetrics and Gynecology

## 2024-04-04 ENCOUNTER — Ambulatory Visit: Attending: Obstetrics and Gynecology

## 2024-04-04 VITALS — BP 115/54 | HR 76

## 2024-04-04 DIAGNOSIS — O99012 Anemia complicating pregnancy, second trimester: Secondary | ICD-10-CM

## 2024-04-04 DIAGNOSIS — Z8632 Personal history of gestational diabetes: Secondary | ICD-10-CM | POA: Diagnosis not present

## 2024-04-04 DIAGNOSIS — O34219 Maternal care for unspecified type scar from previous cesarean delivery: Secondary | ICD-10-CM

## 2024-04-04 DIAGNOSIS — Z98891 History of uterine scar from previous surgery: Secondary | ICD-10-CM

## 2024-04-04 DIAGNOSIS — D563 Thalassemia minor: Secondary | ICD-10-CM

## 2024-04-04 DIAGNOSIS — Z3A25 25 weeks gestation of pregnancy: Secondary | ICD-10-CM

## 2024-04-04 DIAGNOSIS — O021 Missed abortion: Secondary | ICD-10-CM | POA: Diagnosis present

## 2024-04-04 DIAGNOSIS — O09292 Supervision of pregnancy with other poor reproductive or obstetric history, second trimester: Secondary | ICD-10-CM

## 2024-04-04 NOTE — Progress Notes (Signed)
 Maternal-Fetal Medicine Consultation Name: Juaquina Machnik MRN: [id number]  G4 W0981 at 25w 1d gestation.  Patient is here for fetal growth assessment.  Obstetric history significant for 2 term cesarean deliveries.  Both her pregnancies were complicated by gestational diabetes.  In 2024, she had 18-week pregnancy loss.  Intrauterine fetal demise was noted on ultrasound.  Patient reports she had fetal trisomy 75 (genetics reports not available).  Ultrasound Fetal growth is appropriate for gestational age.  Amniotic fluid normal good fetal activity seen.  Placenta is right lateral and there is no evidence of previa or placenta accreta spectrum.  Previous cesarean delivery I counseled the patient that repeat cesarean deliveries increase the risks of placenta previa and/or placenta accreta spectrum.  Vaginal delivery after 2 cesarean sections is associated with with less than 2% incidence of scar rupture. Patient is keen on having repeat cesarean delivery.  She was counseled that there is a high likelihood of gestational diabetes occurring in this pregnancy.  Recommendations - Follow-up scans as clinically indicated.  Consultation including face-to-face (more than 50%) counseling 10 minutes.

## 2024-04-28 ENCOUNTER — Other Ambulatory Visit: Payer: Self-pay

## 2024-04-28 DIAGNOSIS — Z3493 Encounter for supervision of normal pregnancy, unspecified, third trimester: Secondary | ICD-10-CM

## 2024-05-01 ENCOUNTER — Ambulatory Visit (INDEPENDENT_AMBULATORY_CARE_PROVIDER_SITE_OTHER): Payer: Self-pay | Admitting: Student

## 2024-05-01 ENCOUNTER — Other Ambulatory Visit

## 2024-05-01 ENCOUNTER — Other Ambulatory Visit: Payer: Self-pay

## 2024-05-01 VITALS — BP 110/76 | HR 99 | Wt 172.0 lb

## 2024-05-01 DIAGNOSIS — O99113 Other diseases of the blood and blood-forming organs and certain disorders involving the immune mechanism complicating pregnancy, third trimester: Secondary | ICD-10-CM | POA: Diagnosis not present

## 2024-05-01 DIAGNOSIS — Z2839 Other underimmunization status: Secondary | ICD-10-CM

## 2024-05-01 DIAGNOSIS — Z23 Encounter for immunization: Secondary | ICD-10-CM | POA: Diagnosis not present

## 2024-05-01 DIAGNOSIS — Z8759 Personal history of other complications of pregnancy, childbirth and the puerperium: Secondary | ICD-10-CM

## 2024-05-01 DIAGNOSIS — O09899 Supervision of other high risk pregnancies, unspecified trimester: Secondary | ICD-10-CM

## 2024-05-01 DIAGNOSIS — O34219 Maternal care for unspecified type scar from previous cesarean delivery: Secondary | ICD-10-CM

## 2024-05-01 DIAGNOSIS — D563 Thalassemia minor: Secondary | ICD-10-CM | POA: Diagnosis not present

## 2024-05-01 DIAGNOSIS — O09293 Supervision of pregnancy with other poor reproductive or obstetric history, third trimester: Secondary | ICD-10-CM

## 2024-05-01 DIAGNOSIS — Z3493 Encounter for supervision of normal pregnancy, unspecified, third trimester: Secondary | ICD-10-CM

## 2024-05-01 DIAGNOSIS — Z98891 History of uterine scar from previous surgery: Secondary | ICD-10-CM

## 2024-05-01 DIAGNOSIS — Z3A29 29 weeks gestation of pregnancy: Secondary | ICD-10-CM

## 2024-05-01 DIAGNOSIS — Z8632 Personal history of gestational diabetes: Secondary | ICD-10-CM

## 2024-05-01 NOTE — Progress Notes (Signed)
   PRENATAL VISIT NOTE  Subjective:  Maureen Cameron is a 33 y.o. [id number] at [redacted]w[redacted]d being seen today for ongoing prenatal care.  She is currently monitored for the following issues for this low-risk pregnancy and has Maternal varicella, non-immune; History of 2 cesarean sections; Alpha thalassemia silent carrier; History of gestational diabetes; IUFD at less than 20 weeks of gestation; and Supervision of low-risk pregnancy on their problem list.  Patient reports no complaints.  Contractions: Irritability. Vag. Bleeding: None.  Movement: Present. Denies leaking of fluid.   The following portions of the patient's history were reviewed and updated as appropriate: allergies, current medications, past family history, past medical history, past social history, past surgical history and problem list.   Objective:    Vitals:   05/01/24 0844  BP: 110/76  Pulse: 99  Weight: 172 lb (78 kg)    Fetal Status:  Fetal Heart Rate (bpm): 150 Fundal Height: 29 cm Movement: Present    General: Alert, oriented and cooperative. Patient is in no acute distress.  Skin: Skin is warm and dry. No rash noted.   Cardiovascular: Normal heart rate noted  Respiratory: Normal respiratory effort, no problems with respiration noted  Abdomen: Soft, gravid, appropriate for gestational age.  Pain/Pressure: Absent     Pelvic: Cervical exam deferred        Extremities: Normal range of motion.  Edema: None  Mental Status: Normal mood and affect. Normal behavior. Normal judgment and thought content.   Assessment and Plan:  Pregnancy: [id number] at [redacted]w[redacted]d 1. Encounter for supervision of low-risk pregnancy in third trimester (Primary) - Frequent fetal movement - continue routine care  2. [redacted] weeks gestation of pregnancy - Third trimester labs today - Tdap vaccine  3. Maternal varicella, non-immune - recommend vaccine PP  4. Alpha thalassemia silent carrier - Testing completed in prior pregnancy , partner negative  5.  History of IUFD - @ 18 weeks  6. History of 2 cesarean sections - Patient would like repeat , will need consent signed  7. History of gestational diabetes - GTT today  Preterm labor symptoms and general obstetric precautions including but not limited to vaginal bleeding, contractions, leaking of fluid and fetal movement were reviewed in detail with the patient. Please refer to After Visit Summary for other counseling recommendations.   Return in about 2 weeks (around 05/15/2024) for LOB, IN-PERSON.  Future Appointments  Date Time Provider Department Center  05/01/2024  9:35 AM Hoyle Garre, NP [facility]    Garre Hoyle, NP

## 2024-05-02 LAB — CBC
Hematocrit: 39.2 % (ref 34.0–46.6)
Hemoglobin: 12.7 g/dL (ref 11.1–15.9)
MCH: 26.2 pg — ABNORMAL LOW (ref 26.6–33.0)
MCHC: 32.4 g/dL (ref 31.5–35.7)
MCV: 81 fL (ref 79–97)
Platelets: 214 x10E3/uL (ref 150–450)
RBC: 4.85 x10E6/uL (ref 3.77–5.28)
RDW: 14.7 % (ref 11.7–15.4)
WBC: 7.5 x10E3/uL (ref 3.4–10.8)

## 2024-05-02 LAB — GLUCOSE TOLERANCE, 2 HOURS W/ 1HR
Glucose, 1 hour: 244 mg/dL — ABNORMAL HIGH (ref 70–179)
Glucose, 2 hour: 229 mg/dL — ABNORMAL HIGH (ref 70–152)
Glucose, Fasting: 91 mg/dL (ref 70–91)

## 2024-05-02 LAB — HIV ANTIBODY (ROUTINE TESTING W REFLEX): HIV Screen 4th Generation wRfx: NONREACTIVE

## 2024-05-02 LAB — RPR: RPR Ser Ql: NONREACTIVE

## 2024-05-06 ENCOUNTER — Ambulatory Visit: Payer: Self-pay | Admitting: Student

## 2024-05-06 DIAGNOSIS — O24410 Gestational diabetes mellitus in pregnancy, diet controlled: Secondary | ICD-10-CM

## 2024-05-06 MED ORDER — ACCU-CHEK SOFTCLIX LANCETS MISC: 12 refills | Status: AC

## 2024-05-06 MED ORDER — ACCU-CHEK GUIDE TEST VI STRP: ORAL_STRIP | 12 refills | Status: AC

## 2024-05-06 MED ORDER — ACCU-CHEK GUIDE W/DEVICE KIT: 1.0000 | PACK | 0 refills | Status: AC | PRN

## 2024-05-06 NOTE — Telephone Encounter (Addendum)
-----   Message from Nat Dauer sent at 05/06/2024  8:27 AM EDT ----- Please set patient up with GDM management. She will need supplies and consult with nutritionist.  ----- Message ----- From: Interface, [facility] Lab Results In Sent: 05/02/2024   6:37 AM EDT To: Nat Dauer, NP   Pt notified.  Testing supplies send to pt's requested pharmacy.  Pt agreed to Diabetes Education appt on 05/14/24 at 1445.  Pt reports that she has had GDM in her prior pregnancies.  Pt encouraged to start check BS qid- fasting and two hours after breakfast, lunch, and dinner, and document them to bring to her appt.   Pt verbalized understanding with no further questions.   Maureen Mathers,RN

## 2024-05-12 NOTE — Progress Notes (Unsigned)
 Patient was seen on *** for Gestational Diabetes self-management class at the [facility]. The following learning objectives were met by the patient during this course:  States the definition of Gestational Diabetes States why dietary management is important in controlling blood glucose Describes the effects each nutrient has on blood glucose levels Demonstrates ability to create a balanced meal plan Demonstrates carbohydrate counting  States when to check blood glucose levels Demonstrates proper blood glucose monitoring techniques States the effect of stress and exercise on blood glucose levels States the importance of limiting caffeine and abstaining from alcohol and smoking  Blood glucose monitor given: *** Lot # *** Exp: *** Blood glucose reading: ***  *** Patient has a meter prior to visit. Patient is *** testing pre breakfast and 2 hours after each meal. FBS: *** Postprandial: *** Blood glucose today in class ***  Patient instructed to monitor glucose levels:  QID FBS: 60 - <95 1 hour: <140 2 hour: <120  *Patient received handouts: Nutrition Diabetes and Pregnancy Carbohydrate Counting List Blood glucose log Snack ideas for diabetes during pregnancy Plate Planner  Patient will be seen for follow-up as needed.

## 2024-05-14 ENCOUNTER — Telehealth: Admitting: Family Medicine

## 2024-05-14 ENCOUNTER — Encounter: Payer: Self-pay | Admitting: Dietician

## 2024-05-14 ENCOUNTER — Encounter: Attending: Student | Admitting: Dietician

## 2024-05-14 VITALS — BP 110/64

## 2024-05-14 DIAGNOSIS — O24410 Gestational diabetes mellitus in pregnancy, diet controlled: Secondary | ICD-10-CM | POA: Diagnosis present

## 2024-05-14 DIAGNOSIS — O24419 Gestational diabetes mellitus in pregnancy, unspecified control: Secondary | ICD-10-CM

## 2024-05-14 DIAGNOSIS — O0990 Supervision of high risk pregnancy, unspecified, unspecified trimester: Secondary | ICD-10-CM

## 2024-05-14 DIAGNOSIS — Z2839 Other underimmunization status: Secondary | ICD-10-CM

## 2024-05-14 DIAGNOSIS — O0993 Supervision of high risk pregnancy, unspecified, third trimester: Secondary | ICD-10-CM | POA: Diagnosis not present

## 2024-05-14 DIAGNOSIS — Z98891 History of uterine scar from previous surgery: Secondary | ICD-10-CM | POA: Diagnosis not present

## 2024-05-14 DIAGNOSIS — Z3A30 30 weeks gestation of pregnancy: Secondary | ICD-10-CM

## 2024-05-14 DIAGNOSIS — O09893 Supervision of other high risk pregnancies, third trimester: Secondary | ICD-10-CM

## 2024-05-14 DIAGNOSIS — D563 Thalassemia minor: Secondary | ICD-10-CM

## 2024-05-14 MED ORDER — METFORMIN HCL 500 MG PO TABS: 500.0000 mg | ORAL_TABLET | Freq: Two times a day (BID) | ORAL | 5 refills | Status: DC

## 2024-05-14 NOTE — Progress Notes (Signed)
 OBSTETRICS PRENATAL VIRTUAL VISIT ENCOUNTER NOTE  Provider location: Center for [facility]   Patient location: Home  I connected with Maureen Cameron on 05/14/24 at  8:55 AM EDT by MyChart Video Encounter and verified that I am speaking with the correct person using two identifiers. I discussed the limitations, risks, security and privacy concerns of performing an evaluation and management service virtually and the availability of in person appointments. I also discussed with the patient that there may be a patient responsible charge related to this service. The patient expressed understanding and agreed to proceed. Subjective:  Maureen Cameron is a 33 y.o. [id number] at [redacted]w[redacted]d being seen today for ongoing prenatal care.  She is currently monitored for the following issues for this high-risk pregnancy and has Maternal varicella, non-immune; History of 2 cesarean sections; Alpha thalassemia silent carrier; Gestational diabetes mellitus (GDM) affecting third pregnancy; IUFD at less than 20 weeks of gestation; and Supervision of high risk pregnancy, antepartum on their problem list.  Patient reports no complaints.  Contractions: Not present. Vag. Bleeding: None.  Movement: Present. Denies any leaking of fluid.   The following portions of the patient's history were reviewed and updated as appropriate: allergies, current medications, past family history, past medical history, past social history, past surgical history and problem list.   Objective:    Vitals:   05/14/24 0859  BP: 110/64    Fetal Status:      Movement: Present    General: Alert, oriented and cooperative. Patient is in no acute distress.  Respiratory: Normal respiratory effort, no problems with respiration noted  Mental Status: Normal mood and affect. Normal behavior. Normal judgment and thought content.  Rest of physical exam deferred due to type of encounter  Imaging: No results found.  Assessment  and Plan:  Pregnancy: [id number] at [redacted]w[redacted]d 1. [redacted] weeks gestation of pregnancy   2. History of 2 cesarean sections Scheduled for RCS - Ambulatory Referral For Surgery Scheduling  3. Supervision of high risk pregnancy, antepartum (Primary) Continue prenatal care.  4. Maternal varicella, non-immune Consider Varicella pp  5. Alpha thalassemia silent carrier Partner testing is negative  6. Gestational diabetes mellitus (GDM) affecting third pregnancy Fasting CBGs 80-90s 2 hours after breakfast and lunch are 135 2 hour post dinner 170-175 Begin metformin , declined insulin today - US  MFM OB FOLLOW UP; Future - metFORMIN  (GLUCOPHAGE ) 500 MG tablet; Take 1 tablet (500 mg total) by mouth 2 (two) times daily with a meal.  Dispense: 60 tablet; Refill: 5  Preterm labor symptoms and general obstetric precautions including but not limited to vaginal bleeding, contractions, leaking of fluid and fetal movement were reviewed in detail with the patient. I discussed the assessment and treatment plan with the patient. The patient was provided an opportunity to ask questions and all were answered. The patient agreed with the plan and demonstrated an understanding of the instructions. The patient was advised to call back or seek an in-person office evaluation/go to [facility] at [facility] for any urgent or concerning symptoms. Please refer to After Visit Summary for other counseling recommendations.   I provided 9 minutes of face-to-face time during this encounter.  Return in 2 weeks (on 05/28/2024) for [facility], OB visit and BPP.  Future Appointments  Date Time Provider Department Center  05/14/2024  2:45 PM [facility] GDM CLASS [facility] NDM  05/30/2024  8:55 AM Lola Donnice HERO, MD [facility]  06/13/2024 11:15 AM Eveline Lynwood MATSU, MD [facility]  06/20/2024  8:55 AM Ilean Norleen GAILS, MD [facility]  06/27/2024  8:55 AM Eveline Lynwood MATSU, MD [facility]  07/04/2024  8:15 AM Eveline Lynwood MATSU, MD [facility]   07/11/2024 11:15 AM Lola Donnice HERO, MD [facility]    Glenys GORMAN Birk, MD Center for [facility], [facility]

## 2024-05-15 DIAGNOSIS — Z0289 Encounter for other administrative examinations: Secondary | ICD-10-CM

## 2024-05-16 ENCOUNTER — Other Ambulatory Visit: Payer: Self-pay | Admitting: Family Medicine

## 2024-05-16 DIAGNOSIS — Z98891 History of uterine scar from previous surgery: Secondary | ICD-10-CM

## 2024-05-16 DIAGNOSIS — O0990 Supervision of high risk pregnancy, unspecified, unspecified trimester: Secondary | ICD-10-CM

## 2024-05-16 DIAGNOSIS — O24419 Gestational diabetes mellitus in pregnancy, unspecified control: Secondary | ICD-10-CM

## 2024-05-28 ENCOUNTER — Other Ambulatory Visit: Payer: Self-pay | Admitting: Family Medicine

## 2024-05-28 ENCOUNTER — Ambulatory Visit: Attending: Family Medicine | Admitting: Maternal & Fetal Medicine

## 2024-05-28 ENCOUNTER — Other Ambulatory Visit: Payer: Self-pay | Admitting: *Deleted

## 2024-05-28 ENCOUNTER — Encounter: Admitting: Obstetrics and Gynecology

## 2024-05-28 ENCOUNTER — Ambulatory Visit

## 2024-05-28 VITALS — BP 121/67 | HR 89

## 2024-05-28 DIAGNOSIS — O09293 Supervision of pregnancy with other poor reproductive or obstetric history, third trimester: Secondary | ICD-10-CM | POA: Insufficient documentation

## 2024-05-28 DIAGNOSIS — O3663X0 Maternal care for excessive fetal growth, third trimester, not applicable or unspecified: Secondary | ICD-10-CM

## 2024-05-28 DIAGNOSIS — O0990 Supervision of high risk pregnancy, unspecified, unspecified trimester: Secondary | ICD-10-CM

## 2024-05-28 DIAGNOSIS — Z148 Genetic carrier of other disease: Secondary | ICD-10-CM | POA: Diagnosis not present

## 2024-05-28 DIAGNOSIS — O24415 Gestational diabetes mellitus in pregnancy, controlled by oral hypoglycemic drugs: Secondary | ICD-10-CM

## 2024-05-28 DIAGNOSIS — Z3A32 32 weeks gestation of pregnancy: Secondary | ICD-10-CM | POA: Insufficient documentation

## 2024-05-28 DIAGNOSIS — O34211 Maternal care for low transverse scar from previous cesarean delivery: Secondary | ICD-10-CM | POA: Insufficient documentation

## 2024-05-28 DIAGNOSIS — O24419 Gestational diabetes mellitus in pregnancy, unspecified control: Secondary | ICD-10-CM

## 2024-05-28 DIAGNOSIS — O34219 Maternal care for unspecified type scar from previous cesarean delivery: Secondary | ICD-10-CM | POA: Diagnosis not present

## 2024-05-28 DIAGNOSIS — Z7984 Long term (current) use of oral hypoglycemic drugs: Secondary | ICD-10-CM | POA: Diagnosis not present

## 2024-05-28 DIAGNOSIS — Z98891 History of uterine scar from previous surgery: Secondary | ICD-10-CM | POA: Diagnosis not present

## 2024-05-28 DIAGNOSIS — O021 Missed abortion: Secondary | ICD-10-CM

## 2024-05-28 DIAGNOSIS — D563 Thalassemia minor: Secondary | ICD-10-CM

## 2024-05-28 DIAGNOSIS — O99013 Anemia complicating pregnancy, third trimester: Secondary | ICD-10-CM

## 2024-05-28 NOTE — Progress Notes (Signed)
 Patient information  Patient Name: Maureen Cameron  Patient MRN:   [id number]  Referring practice: MFM Referring Provider: [facility])  Problem List   Patient Active Problem List   Diagnosis Date Noted   Supervision of high risk pregnancy, antepartum 12/27/2023   IUFD at less than 20 weeks of gestation 11/24/2022   Gestational diabetes mellitus (GDM) affecting third pregnancy 07/02/2020   Alpha thalassemia silent carrier 06/04/2020   History of 2 cesarean sections 05/12/2020   Maternal varicella, non-immune 10/04/2015    Maternal Fetal medicine Consult  MEGEAN FABIO is a 33 y.o. H5E7987 at [redacted]w[redacted]d here for ultrasound and consultation. Rondell Adkins is doing well today with no acute concerns. Today we focused on the following:   GDM:  Blood sugars are improving with metformin  but the fetus is LGA.  I discussed the complications associated with large for gestational age babies in the setting of diabetes as well as the threshold for cesarean delivery being 4500 g.  She will continue to bring her blood sugar log to her OB provider and try to optimize nearly 100% of her blood sugar values.  The patient had time to ask questions that were answered to her satisfaction.  She verbalized understanding and agrees to proceed with the plan below.  Sonographic findings Single intrauterine pregnancy. Fetal cardiac activity: Observed. Presentation: Cephalic. Interval fetal anatomy appears normal. Fetal biometry shows the estimated fetal weight at the 92 percentile. Amniotic fluid: Within normal limits.  MVP: 3.4 cm. Placenta: Right lateral. BPP: 8/8.   There are limitations of prenatal ultrasound such as the inability to detect certain abnormalities due to poor visualization. Various factors such as fetal position, gestational age and maternal body habitus may increase the difficulty in visualizing the fetal anatomy.    Recommendations - Growth ultrasound in 4 weeks  with MFM -Antenatal testing to start around 32 weeks - Schedule with [facility] -Delivery around 37-[redacted] weeks gestation  based on future growth  Review of Systems: A review of systems was performed and was negative except per HPI   Vitals and Physical Exam    05/28/2024    3:17 PM 05/14/2024    8:59 AM 05/01/2024    8:44 AM  Vitals with BMI  Weight   172 lbs  Systolic 121 110 889  Diastolic 67 64 76  Pulse 89  99    Sitting comfortably on the sonogram table Nonlabored breathing Normal rate and rhythm Abdomen is nontender  Past pregnancies OB History  Gravida Para Term Preterm AB Living  4 2 2  0 1 2  SAB IAB Ectopic Multiple Live Births  1 0 0 0 2    # Outcome Date GA Lbr Len/2nd Weight Sex Type Anes PTL Lv  4 Current           3 SAB 11/25/22 [redacted]w[redacted]d       FD  2 Term 09/11/20 [redacted]w[redacted]d  8 lb 2.3 oz (3.695 kg) F CS-LTranv Spinal  LIV     Complications: Gestational diabetes  1 Term 11/19/15 [redacted]w[redacted]d 26:28 / 09:02 7 lb 11.1 oz (3.49 kg) F CS-LTranv EPI  LIV     Complications: Gestational diabetes     I spent 20 minutes reviewing the patients chart, including labs and images as well as counseling the patient about her medical conditions. Greater than 50% of the time was spent in direct face-to-face patient counseling.  Delora Smaller  MFM, [facility]   05/28/2024  3:55  PM

## 2024-05-29 ENCOUNTER — Ambulatory Visit: Payer: Self-pay | Admitting: Family Medicine

## 2024-05-30 ENCOUNTER — Other Ambulatory Visit: Payer: Self-pay

## 2024-05-30 ENCOUNTER — Ambulatory Visit: Admitting: Family Medicine

## 2024-05-30 VITALS — BP 109/72 | HR 82 | Wt 174.9 lb

## 2024-05-30 DIAGNOSIS — O0993 Supervision of high risk pregnancy, unspecified, third trimester: Secondary | ICD-10-CM | POA: Diagnosis not present

## 2024-05-30 DIAGNOSIS — O24419 Gestational diabetes mellitus in pregnancy, unspecified control: Secondary | ICD-10-CM | POA: Diagnosis not present

## 2024-05-30 DIAGNOSIS — O021 Missed abortion: Secondary | ICD-10-CM

## 2024-05-30 DIAGNOSIS — Z98891 History of uterine scar from previous surgery: Secondary | ICD-10-CM

## 2024-05-30 DIAGNOSIS — Z3A33 33 weeks gestation of pregnancy: Secondary | ICD-10-CM

## 2024-05-30 DIAGNOSIS — O0990 Supervision of high risk pregnancy, unspecified, unspecified trimester: Secondary | ICD-10-CM

## 2024-05-30 MED ORDER — METFORMIN HCL 500 MG PO TABS: 1000.0000 mg | ORAL_TABLET | Freq: Two times a day (BID) | ORAL | 5 refills | Status: AC

## 2024-05-30 NOTE — Patient Instructions (Signed)

## 2024-05-30 NOTE — Progress Notes (Signed)
   Subjective:  Maureen Cameron is a 33 y.o. H5E7987 at [redacted]w[redacted]d being seen today for ongoing prenatal care.  She is currently monitored for the following issues for this high-risk pregnancy and has Maternal varicella, non-immune; History of 2 cesarean sections; Alpha thalassemia silent carrier; Gestational diabetes mellitus (GDM) affecting third pregnancy; IUFD at less than 20 weeks of gestation; and Supervision of high risk pregnancy, antepartum on their problem list.  Patient reports no complaints.  Contractions: Irritability. Vag. Bleeding: None.  Movement: Present. Denies leaking of fluid.   The following portions of the patient's history were reviewed and updated as appropriate: allergies, current medications, past family history, past medical history, past social history, past surgical history and problem list. Problem list updated.  Objective:   Vitals:   05/30/24 0906  BP: 109/72  Pulse: 82  Weight: 174 lb 14.4 oz (79.3 kg)    Fetal Status:     Movement: Present     General:  Alert, oriented and cooperative. Patient is in no acute distress.  Skin: Skin is warm and dry. No rash noted.   Cardiovascular: Normal heart rate noted  Respiratory: Normal respiratory effort, no problems with respiration noted  Abdomen: Soft, gravid, appropriate for gestational age. Pain/Pressure: Present     Pelvic: Vag. Bleeding: None     Cervical exam deferred        Extremities: Normal range of motion.  Edema: None  Mental Status: Normal mood and affect. Normal behavior. Normal judgment and thought content.   Urinalysis:      Assessment and Plan:  Pregnancy: [id number] at [redacted]w[redacted]d  1. [redacted] weeks gestation of pregnancy (Primary)   2. Supervision of high risk pregnancy, antepartum BP and FHR normal  3. Gestational diabetes mellitus (GDM) affecting third pregnancy Log forgotten at home On recall fastings are 85-90, breakfast/lunch are <120, dinner values are 150+ Currently on metformin  500 mg BID,  increase to 1g BID Last growth US  05/28/2024, EFW 92%, 2505g, AC >99%, AFI wnl Has growth US  06/30/2024, discussed cesarean may be moved up from 39 weeks to 37-38 depending on that scan  4. History of 2 cesarean sections Scheduled for RCS at 39 weeks, does not want BTL at present though we did discuss it at length and she is considering it  5. IUFD at less than 20 weeks of gestation At 18 wks with last pregnancy, two prior term deliveries before this  Preterm labor symptoms and general obstetric precautions including but not limited to vaginal bleeding, contractions, leaking of fluid and fetal movement were reviewed in detail with the patient. Please refer to After Visit Summary for other counseling recommendations.  Return in 2 weeks (on 06/13/2024) for [facility], ob visit.   Lola Donnice HERO, MD

## 2024-06-02 ENCOUNTER — Other Ambulatory Visit: Payer: Self-pay | Admitting: Student

## 2024-06-04 ENCOUNTER — Ambulatory Visit (HOSPITAL_BASED_OUTPATIENT_CLINIC_OR_DEPARTMENT_OTHER): Admitting: Obstetrics and Gynecology

## 2024-06-04 ENCOUNTER — Ambulatory Visit (HOSPITAL_BASED_OUTPATIENT_CLINIC_OR_DEPARTMENT_OTHER): Attending: Maternal & Fetal Medicine

## 2024-06-04 VITALS — BP 116/63 | HR 81

## 2024-06-04 DIAGNOSIS — O021 Missed abortion: Secondary | ICD-10-CM | POA: Insufficient documentation

## 2024-06-04 DIAGNOSIS — O24419 Gestational diabetes mellitus in pregnancy, unspecified control: Secondary | ICD-10-CM | POA: Insufficient documentation

## 2024-06-04 DIAGNOSIS — O3663X0 Maternal care for excessive fetal growth, third trimester, not applicable or unspecified: Secondary | ICD-10-CM | POA: Diagnosis present

## 2024-06-04 DIAGNOSIS — Z98891 History of uterine scar from previous surgery: Secondary | ICD-10-CM

## 2024-06-04 DIAGNOSIS — O0990 Supervision of high risk pregnancy, unspecified, unspecified trimester: Secondary | ICD-10-CM | POA: Insufficient documentation

## 2024-06-04 DIAGNOSIS — Z3A33 33 weeks gestation of pregnancy: Secondary | ICD-10-CM

## 2024-06-04 DIAGNOSIS — Z0289 Encounter for other administrative examinations: Secondary | ICD-10-CM

## 2024-06-04 DIAGNOSIS — O24415 Gestational diabetes mellitus in pregnancy, controlled by oral hypoglycemic drugs: Secondary | ICD-10-CM

## 2024-06-04 NOTE — Progress Notes (Signed)
 Maternal-Fetal Medicine Consultation Name: Maureen Cameron MRN: [id number]  G4 E7987 at 33w 6d gestation.  Patient is here for antenatal testing.  She has gestational diabetes and takes metformin  500 mg twice daily.  Her physician had advised to increase metformin  to 1000 mg twice daily.  Patient reports her blood glucose levels are within normal range.  Patient has not brought her logbook today.  Blood pressure today at our office is 116/63 mmHg.  Ultrasound performed last week showed estimated fetal weight at the 92nd percentile.  Ultrasound Amniotic fluid normal good fetal activity seen.  Cephalic presentation.  Antenatal testing is reassuring.  BPP 8/8.  Gestational diabetes I discussed the importance of good blood glucose control to prevent adverse outcomes.  We discussed normal blood glucose parameters.  I informed the patient that her provider had recommended to increase metformin  to 1,000 mg twice daily.  If her blood glucose levels are not well-controlled, she should increase the dosage. I counseled the patient that type 2 diabetes more common in women with gestational diabetes.  I recommended screening in the postpartum.  At 6 to [redacted] weeks gestation with glucose challenge test.  Recommendations - Patient has appointments for weekly antenatal testing at her office. - Fetal growth assessment and BPP at our office in 4 weeks.   Consultation including face-to-face (more than 50%) counseling 20 minutes.

## 2024-06-11 ENCOUNTER — Other Ambulatory Visit

## 2024-06-11 ENCOUNTER — Encounter: Admitting: Family Medicine

## 2024-06-11 ENCOUNTER — Telehealth: Payer: Self-pay

## 2024-06-11 NOTE — Telephone Encounter (Signed)
 RN spoke with pt in regards to [facility] paperwork that was given to office for completion.  After reviewing paperwork, RN noted forms are for pt spouse to complete and did not contain any areas that stated to be completed by office staff.  RN advised pt that it appears that her spouse needs to complete form.  Pt verbalized understanding and states she will pick up papers while at her OB appointment on 06/13/24.  Pt had no further questions at this time.    [place], RN

## 2024-06-13 ENCOUNTER — Ambulatory Visit: Admitting: Obstetrics & Gynecology

## 2024-06-13 ENCOUNTER — Other Ambulatory Visit: Payer: Self-pay

## 2024-06-13 VITALS — BP 121/79 | HR 91 | Wt 177.0 lb

## 2024-06-13 DIAGNOSIS — O0990 Supervision of high risk pregnancy, unspecified, unspecified trimester: Secondary | ICD-10-CM

## 2024-06-13 DIAGNOSIS — O24419 Gestational diabetes mellitus in pregnancy, unspecified control: Secondary | ICD-10-CM | POA: Diagnosis not present

## 2024-06-13 DIAGNOSIS — O0993 Supervision of high risk pregnancy, unspecified, third trimester: Secondary | ICD-10-CM

## 2024-06-13 DIAGNOSIS — Z3A35 35 weeks gestation of pregnancy: Secondary | ICD-10-CM

## 2024-06-13 DIAGNOSIS — Z98891 History of uterine scar from previous surgery: Secondary | ICD-10-CM | POA: Diagnosis not present

## 2024-06-13 MED ORDER — INSULIN ASPART (W/NIACINAMIDE) 100 UNIT/ML ~~LOC~~ SOPN: 4.0000 [IU] | PEN_INJECTOR | Freq: Every day | SUBCUTANEOUS | 1 refills | Status: AC

## 2024-06-13 MED ORDER — INSULIN ASPART 100 UNIT/ML IJ SOLN: 4.0000 [IU] | Freq: Every day | INTRAMUSCULAR | 12 refills | Status: DC

## 2024-06-13 NOTE — Progress Notes (Signed)
   PRENATAL VISIT NOTE  Subjective:  Maureen Cameron is a 33 y.o. H5E7987 at [redacted]w[redacted]d being seen today for ongoing prenatal care.  She is currently monitored for the following issues for this high-risk pregnancy and has Maternal varicella, non-immune; History of 2 cesarean sections; Alpha thalassemia silent carrier; Gestational diabetes mellitus (GDM) affecting third pregnancy; IUFD at less than 20 weeks of gestation; and Supervision of high risk pregnancy, antepartum on their problem list.  Patient reports no complaints.  Contractions: Irritability. Vag. Bleeding: None.  Movement: Present. Denies leaking of fluid.   The following portions of the patient's history were reviewed and updated as appropriate: allergies, current medications, past family history, past medical history, past social history, past surgical history and problem list.   Objective:    Vitals:   06/13/24 1138  BP: 121/79  Pulse: 91  Weight: 177 lb (80.3 kg)    Fetal Status:      Movement: Present    General: Alert, oriented and cooperative. Patient is in no acute distress.  Skin: Skin is warm and dry. No rash noted.   Cardiovascular: Normal heart rate noted  Respiratory: Normal respiratory effort, no problems with respiration noted  Abdomen: Soft, gravid, appropriate for gestational age.  Pain/Pressure: Present     Pelvic: Cervical exam deferred        Extremities: Normal range of motion.  Edema: None  Mental Status: Normal mood and affect. Normal behavior. Normal judgment and thought content.   Assessment and Plan:  Pregnancy: [id number] at [redacted]w[redacted]d 1. History of 2 cesarean sections (Primary) Repeat at 39 weeks is scheduled  2. Gestational diabetes mellitus (GDM) affecting third pregnancy FBS 90, PP in evening >150, will add insulin  at supper - insulin  aspart (FIASP ) 100 UNIT/ML FlexTouch Pen; Inject 4 Units into the skin daily with supper.  Dispense: 15 mL; Refill: 1  3. Supervision of high risk pregnancy,  antepartum Weekly testing  Preterm labor symptoms and general obstetric precautions including but not limited to vaginal bleeding, contractions, leaking of fluid and fetal movement were reviewed in detail with the patient. Please refer to After Visit Summary for other counseling recommendations.   Return in about 1 week (around 06/20/2024).  Future Appointments  Date Time Provider Department Center  06/19/2024  1:15 PM [facility] US2 [facility]  06/20/2024  8:55 AM Ilean Norleen GAILS, MD [facility]  06/26/2024  1:15 PM [facility] US2 [facility]  06/27/2024  8:55 AM Eveline Lynwood MATSU, MD [facility]  06/30/2024 11:15 AM [facility] PROVIDER 1 [facility]  06/30/2024 11:30 AM [facility] US2 [facility]  07/03/2024  1:15 PM [facility] US2 [facility]  07/04/2024  8:15 AM Eveline Lynwood MATSU, MD [facility]  07/10/2024  1:15 PM [facility] US2 [facility]  07/11/2024 10:15 AM Anyanwu, Gloris LABOR, MD [facility]  07/17/2024  1:15 PM [facility] US[facility]  07/18/2024  9:35 AM Cresenzo, Norleen GAILS, MD [facility]    Lynwood Eveline, MD

## 2024-06-17 ENCOUNTER — Other Ambulatory Visit: Payer: Self-pay | Admitting: *Deleted

## 2024-06-17 ENCOUNTER — Encounter: Attending: Student | Admitting: Skilled Nursing Facility1

## 2024-06-17 DIAGNOSIS — O24419 Gestational diabetes mellitus in pregnancy, unspecified control: Secondary | ICD-10-CM | POA: Diagnosis present

## 2024-06-17 DIAGNOSIS — O24414 Gestational diabetes mellitus in pregnancy, insulin controlled: Secondary | ICD-10-CM

## 2024-06-17 NOTE — Progress Notes (Signed)
 Patient was seen for Gestational Diabetes: insulin  start Start time 9:37 and End time 10:15   Estimated due date: 35 weeks and 5 days a long   Clinical: Medications: Fiasp  4 units with dinner Medical History: GDM  Dietary and Lifestyle History:  Pt states she has had controlled blood sugars throughout the day expect with dinner stating her fasting's are always around 90.   Physical Activity:  Stress:  Sleep:   24 hr Recall:  First Meal:   Snack:   Second meal:   Snack:   Third meal:  cereal or pasta or rice or naan Snack:   Beverages:    NUTRITION INTERVENTION  Nutrition education (E-1) on the following topics:   Initial Follow-up  []  []  Definition of Gestational Diabetes []  []  Why dietary management is important in controlling blood glucose []  []  Effects each nutrient has on blood glucose levels []  []  Simple carbohydrates vs complex carbohydrates []  []  Fluid intake []  [x]  Creating a balanced meal plan []  []  Carbohydrate counting  []  []  When to check blood glucose levels []  []  Proper blood glucose monitoring techniques []  []  Effect of stress and stress reduction techniques  []  []  Exercise effect on blood glucose levels, appropriate exercise during pregnancy []  []  Importance of limiting caffeine and abstaining from alcohol and smoking []  [x]  Medications used for blood sugar control during pregnancy; if you have taken your insulin  you need to eat, do not get distracted this is rapid acting insulin  it is taken with meals not 15 minutes before []  [x]  Hypoglycemia and rule of 15 []  []  Postpartum self care   Patient instructed to monitor glucose levels: FBS: 60 - <= 95 mg/dL; 2 hour: <= 879 mg/dL  Patient received handouts: Insulin  pen use with images   Patient will be seen for follow-up as needed.

## 2024-06-18 ENCOUNTER — Encounter: Payer: Self-pay | Admitting: Obstetrics & Gynecology

## 2024-06-18 ENCOUNTER — Other Ambulatory Visit: Payer: Self-pay | Admitting: Lactation Services

## 2024-06-18 MED ORDER — PEN NEEDLES 33G X 4 MM MISC: 1.0000 | Freq: Four times a day (QID) | 1 refills | Status: AC

## 2024-06-19 ENCOUNTER — Encounter: Admitting: Obstetrics and Gynecology

## 2024-06-19 ENCOUNTER — Ambulatory Visit (INDEPENDENT_AMBULATORY_CARE_PROVIDER_SITE_OTHER)

## 2024-06-19 ENCOUNTER — Other Ambulatory Visit: Payer: Self-pay

## 2024-06-19 DIAGNOSIS — Z0289 Encounter for other administrative examinations: Secondary | ICD-10-CM

## 2024-06-19 DIAGNOSIS — O24414 Gestational diabetes mellitus in pregnancy, insulin controlled: Secondary | ICD-10-CM

## 2024-06-19 DIAGNOSIS — Z3A36 36 weeks gestation of pregnancy: Secondary | ICD-10-CM

## 2024-06-20 ENCOUNTER — Ambulatory Visit: Admitting: Family Medicine

## 2024-06-20 ENCOUNTER — Other Ambulatory Visit (HOSPITAL_COMMUNITY)
Admission: RE | Admit: 2024-06-20 | Discharge: 2024-06-20 | Disposition: A | Discharge status: Home / Self Care | Source: Ambulatory Visit | Attending: Family Medicine | Admitting: Family Medicine

## 2024-06-20 VITALS — BP 118/65 | HR 106 | Wt 177.8 lb

## 2024-06-20 DIAGNOSIS — O0990 Supervision of high risk pregnancy, unspecified, unspecified trimester: Secondary | ICD-10-CM | POA: Insufficient documentation

## 2024-06-20 DIAGNOSIS — O24414 Gestational diabetes mellitus in pregnancy, insulin controlled: Secondary | ICD-10-CM

## 2024-06-20 DIAGNOSIS — Z23 Encounter for immunization: Secondary | ICD-10-CM | POA: Diagnosis not present

## 2024-06-20 DIAGNOSIS — O0993 Supervision of high risk pregnancy, unspecified, third trimester: Secondary | ICD-10-CM | POA: Diagnosis not present

## 2024-06-20 DIAGNOSIS — O021 Missed abortion: Secondary | ICD-10-CM

## 2024-06-20 DIAGNOSIS — Z3A36 36 weeks gestation of pregnancy: Secondary | ICD-10-CM

## 2024-06-20 DIAGNOSIS — Z98891 History of uterine scar from previous surgery: Secondary | ICD-10-CM | POA: Diagnosis not present

## 2024-06-20 DIAGNOSIS — O24419 Gestational diabetes mellitus in pregnancy, unspecified control: Secondary | ICD-10-CM

## 2024-06-20 NOTE — Progress Notes (Signed)
 PRENATAL VISIT NOTE  Subjective:  Maureen Cameron is a 33 y.o. H5E7987 at [redacted]w[redacted]d being seen today for ongoing prenatal care.  She is currently monitored for the following issues for this high-risk pregnancy and has Maternal varicella, non-immune; History of 2 cesarean sections; Alpha thalassemia silent carrier; Gestational diabetes mellitus (GDM) affecting third pregnancy; IUFD at less than 20 weeks of gestation; and Supervision of high risk pregnancy, antepartum on their problem list.  Patient reports no bleeding, no contractions, no cramping, and no leaking.   . Vag. Bleeding: None.  Movement: Present. Denies leaking of fluid.   The following portions of the patient's history were reviewed and updated as appropriate: allergies, current medications, past family history, past medical history, past social history, past surgical history and problem list.   Objective:    Vitals:   06/20/24 0854  BP: 118/65  Pulse: (!) 106  Weight: 177 lb 12.8 oz (80.6 kg)    Fetal Status:  Fetal Heart Rate (bpm): 143   Movement: Present    General: Alert, oriented and cooperative. Patient is in no acute distress.  Skin: Skin is warm and dry. No rash noted.   Cardiovascular: Normal heart rate noted  Respiratory: Normal respiratory effort, no problems with respiration noted  Abdomen: Soft, gravid, appropriate for gestational age.  Pain/Pressure: Absent     Pelvic: Cervical exam deferred        Extremities: Normal range of motion.  Edema: Mild pitting, slight indentation  Mental Status: Normal mood and affect. Normal behavior. Normal judgment and thought content.   Assessment and Plan:  Pregnancy: H5E7987 at [redacted]w[redacted]d 1. Supervision of high risk pregnancy, antepartum (Primary) FHR and BP appropriate today - GC/Chlamydia probe amp ([place])not at [facility] - Culture, beta strep (group b only) - Flu vaccine trivalent PF, 6mos and older(Flulaval,Afluria,Fluarix,Fluzone)  2. Gestational diabetes mellitus (GDM)  affecting third pregnancy 3. Insulin  controlled gestational diabetes mellitus (GDM) in third trimester Patient currently on short acting insulin  after dinner.  Takes 4 units.  Reports postprandial blood sugars after dinner are 130s to 140s which is improved from the 160s.  She takes 1 g of metformin  in the morning but thought that she was supposed to stop taking her metformin  at night with the insulin  so has not been taking it nightly.  Discussed starting back the metformin  at night as well and patient agreeable.  Spoke with MFM on-call regarding patient's delivery scheduling.  Per the note recommended delivery between 37 and 38 weeks given LGA and poorly controlled gestational diabetes.  She was originally scheduled for 39 weeks.  Move C-section to 37 weeks 2 days which is first available date after her 37-week mark.  Scheduled for C-section on 9/13.  Patient will have BPP on 9/11 and R OB visit on 9/12.  4. History of 2 cesarean sections Repeat C-section scheduled  5. IUFD at less than 20 weeks of gestation  51. [redacted] weeks gestation of pregnancy 36 weeks swabs  collected today  Preterm labor symptoms and general obstetric precautions including but not limited to vaginal bleeding, contractions, leaking of fluid and fetal movement were reviewed in detail with the patient. Please refer to After Visit Summary for other counseling recommendations.   No follow-ups on file.  Future Appointments  Date Time Provider Department Center  06/26/2024  1:15 PM [facility] US2 [facility]  06/27/2024  8:55 AM Eveline Lynwood MATSU, MD [facility]  06/30/2024 11:15 AM [facility] PROVIDER 1 [facility]  06/30/2024 11:30 AM [facility] US2  [facility]  07/03/2024  1:15 PM [facility] US2 [facility]  07/04/2024  8:15 AM Eveline Lynwood MATSU, MD [facility]  07/10/2024  1:15 PM [facility] US2 [facility]  07/11/2024 10:15 AM Herchel, Gloris LABOR, MD [facility]  07/17/2024  1:15 PM [facility] US2 [facility]  07/18/2024  9:35 AM Auset Fritzler, Norleen GAILS, MD  [facility]    Norleen GAILS Rover, MD

## 2024-06-23 LAB — GC/CHLAMYDIA PROBE AMP (~~LOC~~) NOT AT ARMC
Chlamydia: NEGATIVE
Comment: NEGATIVE
Comment: NORMAL
Neisseria Gonorrhea: NEGATIVE

## 2024-06-24 ENCOUNTER — Encounter (HOSPITAL_COMMUNITY): Payer: Self-pay

## 2024-06-24 LAB — CULTURE, BETA STREP (GROUP B ONLY): Strep Gp B Culture: NEGATIVE

## 2024-06-24 NOTE — Patient Instructions (Signed)
 Maureen Cameron  06/24/2024   Your procedure is scheduled on:  06/28/2024  Arrive at 0945 at Entrance C on [employer] at [facility]  and [employer]. You are invited to use the FREE valet parking or use the Visitor's parking deck.  Pick up the phone at the desk and dial [phone].  Call this number if you have problems the morning of surgery: [phone]  Remember:   Do not eat food:(After Midnight) Desps de medianoche.  You may drink clear liquids until  ___0745__.  Clear liquids means a liquid you can see thru.  It can have color such as Cola or Kool aid.  Tea is OK and coffee as long as no milk or creamer of any kind.  Take these medicines the morning of surgery with A SIP OF WATER:  none   Do not wear jewelry, make-up or nail polish.  Do not wear lotions, powders, or perfumes. Do not wear deodorant.  Do not shave 48 hours prior to surgery.  Do not bring valuables to the hospital.  [facility] is not   responsible for any belongings or valuables brought to the hospital.  Contacts, dentures or bridgework may not be worn into surgery.  Leave suitcase in the car. After surgery it may be brought to your room.  For patients admitted to the hospital, checkout time is 11:00 AM the day of              discharge.      Please read over the following fact sheets that you were given:     Preparing for Surgery

## 2024-06-25 ENCOUNTER — Other Ambulatory Visit: Payer: Self-pay

## 2024-06-25 DIAGNOSIS — O24419 Gestational diabetes mellitus in pregnancy, unspecified control: Secondary | ICD-10-CM

## 2024-06-26 ENCOUNTER — Encounter: Admitting: Obstetrics & Gynecology

## 2024-06-26 ENCOUNTER — Encounter (HOSPITAL_COMMUNITY)
Admission: RE | Admit: 2024-06-26 | Discharge: 2024-06-26 | Disposition: A | Discharge status: Home / Self Care | Source: Ambulatory Visit | Attending: Family Medicine | Admitting: Family Medicine

## 2024-06-26 ENCOUNTER — Ambulatory Visit (INDEPENDENT_AMBULATORY_CARE_PROVIDER_SITE_OTHER)

## 2024-06-26 DIAGNOSIS — Z01812 Encounter for preprocedural laboratory examination: Secondary | ICD-10-CM | POA: Insufficient documentation

## 2024-06-26 DIAGNOSIS — O24419 Gestational diabetes mellitus in pregnancy, unspecified control: Secondary | ICD-10-CM | POA: Insufficient documentation

## 2024-06-26 DIAGNOSIS — Z3A37 37 weeks gestation of pregnancy: Secondary | ICD-10-CM

## 2024-06-26 DIAGNOSIS — Z3A00 Weeks of gestation of pregnancy not specified: Secondary | ICD-10-CM | POA: Insufficient documentation

## 2024-06-26 DIAGNOSIS — Z98891 History of uterine scar from previous surgery: Secondary | ICD-10-CM | POA: Insufficient documentation

## 2024-06-26 DIAGNOSIS — Z0289 Encounter for other administrative examinations: Secondary | ICD-10-CM

## 2024-06-26 LAB — CBC
HCT: 39.9 % (ref 36.0–46.0)
Hemoglobin: 13.2 g/dL (ref 12.0–15.0)
MCH: 25.8 pg — ABNORMAL LOW (ref 26.0–34.0)
MCHC: 33.1 g/dL (ref 30.0–36.0)
MCV: 78.1 fL — ABNORMAL LOW (ref 80.0–100.0)
Platelets: 260 K/uL (ref 150–400)
RBC: 5.11 MIL/uL (ref 3.87–5.11)
RDW: 15.7 % — ABNORMAL HIGH (ref 11.5–15.5)
WBC: 6.1 K/uL (ref 4.0–10.5)
nRBC: 0 % (ref 0.0–0.2)

## 2024-06-26 LAB — TYPE AND SCREEN
ABO/RH(D): AB POS
Antibody Screen: NEGATIVE

## 2024-06-26 LAB — RAPID HIV SCREEN (HIV 1/2 AB+AG)
HIV 1/2 Antibodies: NONREACTIVE
HIV-1 P24 Antigen - HIV24: NONREACTIVE

## 2024-06-27 ENCOUNTER — Other Ambulatory Visit: Payer: Self-pay

## 2024-06-27 ENCOUNTER — Ambulatory Visit: Admitting: Obstetrics & Gynecology

## 2024-06-27 VITALS — BP 122/76 | HR 85 | Wt 178.0 lb

## 2024-06-27 DIAGNOSIS — Z3A37 37 weeks gestation of pregnancy: Secondary | ICD-10-CM

## 2024-06-27 DIAGNOSIS — O24419 Gestational diabetes mellitus in pregnancy, unspecified control: Secondary | ICD-10-CM | POA: Diagnosis not present

## 2024-06-27 DIAGNOSIS — Z98891 History of uterine scar from previous surgery: Secondary | ICD-10-CM

## 2024-06-27 DIAGNOSIS — O0993 Supervision of high risk pregnancy, unspecified, third trimester: Secondary | ICD-10-CM

## 2024-06-27 DIAGNOSIS — O0990 Supervision of high risk pregnancy, unspecified, unspecified trimester: Secondary | ICD-10-CM

## 2024-06-27 LAB — RPR: RPR Ser Ql: NONREACTIVE

## 2024-06-27 NOTE — Progress Notes (Signed)
   PRENATAL VISIT NOTE  Subjective:  Maureen Cameron is a 33 y.o. H5E7987 at [redacted]w[redacted]d being seen today for ongoing prenatal care.  She is currently monitored for the following issues for this high-risk pregnancy and has Maternal varicella, non-immune; History of 2 cesarean sections; Alpha thalassemia silent carrier; Gestational diabetes mellitus (GDM) affecting third pregnancy; and Supervision of high risk pregnancy, antepartum on their problem list.  Patient reports no complaints.  Contractions: Not present. Vag. Bleeding: None.  Movement: Present. Denies leaking of fluid.   The following portions of the patient's history were reviewed and updated as appropriate: allergies, current medications, past family history, past medical history, past social history, past surgical history and problem list.   Objective:    Vitals:   06/27/24 0909  BP: (!) 141/79  Pulse: 85  Weight: 178 lb (80.7 kg)    Fetal Status:  Fetal Heart Rate (bpm): 156   Movement: Present    General: Alert, oriented and cooperative. Patient is in no acute distress.  Skin: Skin is warm and dry. No rash noted.   Cardiovascular: Normal heart rate noted  Respiratory: Normal respiratory effort, no problems with respiration noted  Abdomen: Soft, gravid, appropriate for gestational age.  Pain/Pressure: Absent     Pelvic: Cervical exam deferred        Extremities: Normal range of motion.  Edema: None  Mental Status: Normal mood and affect. Normal behavior. Normal judgment and thought content.   Assessment and Plan:  Pregnancy: H5E7987 at [redacted]w[redacted]d 1. [redacted] weeks gestation of pregnancy (Primary) RCS BTL tomorrow  2. History of 2 cesarean sections RCS [redacted] weeks gestation of pregnancy  History of 2 cesarean sections  Gestational diabetes mellitus (GDM) affecting third pregnancy  Supervision of high risk pregnancy, antepartum   Preterm labor symptoms and general obstetric precautions including but not limited to vaginal bleeding,  contractions, leaking of fluid and fetal movement were reviewed in detail with the patient. Please refer to After Visit Summary for other counseling recommendations.   Return if symptoms worsen or fail to improve.  Future Appointments  Date Time Provider Department Center  06/30/2024 11:15 AM [facility] PROVIDER 1 [facility]  06/30/2024 11:30 AM [facility] US2 [facility]  07/03/2024  1:15 PM [facility] US2 [facility]  07/04/2024  8:15 AM Eveline Lynwood MATSU, MD [facility]  07/10/2024  1:15 PM [facility] US2 [facility]  07/11/2024 10:15 AM Anyanwu, Gloris LABOR, MD [facility]  07/17/2024  1:15 PM [facility] US2 [facility]  07/18/2024  9:35 AM Cresenzo, Norleen GAILS, MD [facility]    Lynwood Eveline, MD

## 2024-06-28 ENCOUNTER — Other Ambulatory Visit: Payer: Self-pay

## 2024-06-28 ENCOUNTER — Encounter (HOSPITAL_COMMUNITY)
Admission: RE | Disposition: A | Discharge status: Home / Self Care | Payer: Self-pay | Source: Home / Self Care | Attending: Obstetrics and Gynecology

## 2024-06-28 ENCOUNTER — Encounter (HOSPITAL_COMMUNITY): Payer: Self-pay | Admitting: Family Medicine

## 2024-06-28 ENCOUNTER — Inpatient Hospital Stay (HOSPITAL_COMMUNITY)
Admission: RE | Admit: 2024-06-28 | Discharge: 2024-06-30 | DRG: 785 | Disposition: A | Discharge status: Home / Self Care | Attending: Obstetrics and Gynecology | Admitting: Obstetrics and Gynecology

## 2024-06-28 ENCOUNTER — Inpatient Hospital Stay (HOSPITAL_COMMUNITY): Admitting: Anesthesiology

## 2024-06-28 DIAGNOSIS — Z833 Family history of diabetes mellitus: Secondary | ICD-10-CM | POA: Diagnosis not present

## 2024-06-28 DIAGNOSIS — O24424 Gestational diabetes mellitus in childbirth, insulin controlled: Secondary | ICD-10-CM | POA: Diagnosis present

## 2024-06-28 DIAGNOSIS — Z148 Genetic carrier of other disease: Secondary | ICD-10-CM

## 2024-06-28 DIAGNOSIS — Z3A37 37 weeks gestation of pregnancy: Secondary | ICD-10-CM

## 2024-06-28 DIAGNOSIS — O34211 Maternal care for low transverse scar from previous cesarean delivery: Secondary | ICD-10-CM | POA: Diagnosis present

## 2024-06-28 DIAGNOSIS — O3663X0 Maternal care for excessive fetal growth, third trimester, not applicable or unspecified: Secondary | ICD-10-CM | POA: Diagnosis present

## 2024-06-28 DIAGNOSIS — Z8249 Family history of ischemic heart disease and other diseases of the circulatory system: Secondary | ICD-10-CM | POA: Diagnosis not present

## 2024-06-28 DIAGNOSIS — Z9851 Tubal ligation status: Secondary | ICD-10-CM

## 2024-06-28 DIAGNOSIS — Z98891 History of uterine scar from previous surgery: Principal | ICD-10-CM

## 2024-06-28 DIAGNOSIS — Z302 Encounter for sterilization: Secondary | ICD-10-CM

## 2024-06-28 DIAGNOSIS — O24419 Gestational diabetes mellitus in pregnancy, unspecified control: Secondary | ICD-10-CM

## 2024-06-28 HISTORY — PX: BILATERAL SALPINGECTOMY: SHX5743

## 2024-06-28 LAB — CREATININE, SERUM
Creatinine, Ser: 0.49 mg/dL (ref 0.44–1.00)
GFR, Estimated: >60 mL/min (ref >60)

## 2024-06-28 LAB — CBC
HCT: 40.8 % (ref 36.0–46.0)
Hemoglobin: 13.7 g/dL (ref 12.0–15.0)
MCH: 25.8 pg — ABNORMAL LOW (ref 26.0–34.0)
MCHC: 33.6 g/dL (ref 30.0–36.0)
MCV: 77 fL — ABNORMAL LOW (ref 80.0–100.0)
Platelets: 250 K/uL (ref 150–400)
RBC: 5.3 MIL/uL — ABNORMAL HIGH (ref 3.87–5.11)
RDW: 15.9 % — ABNORMAL HIGH (ref 11.5–15.5)
WBC: 13 K/uL — ABNORMAL HIGH (ref 4.0–10.5)
nRBC: 0 % (ref 0.0–0.2)

## 2024-06-28 LAB — GLUCOSE, CAPILLARY
Glucose-Capillary: 66 mg/dL — ABNORMAL LOW (ref 70–99)
Glucose-Capillary: 74 mg/dL (ref 70–99)

## 2024-06-28 SURGERY — Surgical Case
Anesthesia: Spinal | Site: Abdomen

## 2024-06-28 MED ORDER — PHENYLEPHRINE HCL-NACL 20-0.9 MG/250ML-% IV SOLN
INTRAVENOUS | Status: DC | PRN
  Administered 2024-06-28: 60 ug/min via INTRAVENOUS

## 2024-06-28 MED ORDER — WITCH HAZEL-GLYCERIN EX PADS: 1.0000 | MEDICATED_PAD | CUTANEOUS | Status: DC | PRN

## 2024-06-28 MED ORDER — IBUPROFEN 600 MG PO TABS
600.0000 mg | ORAL_TABLET | Freq: Four times a day (QID) | ORAL | Status: DC
  Administered 2024-06-29 – 2024-06-30 (×4): 600 mg via ORAL
  Filled 2024-06-28 (×4): qty 1

## 2024-06-28 MED ORDER — ZOLPIDEM TARTRATE 5 MG PO TABS: 5.0000 mg | ORAL_TABLET | Freq: Every evening | ORAL | Status: DC | PRN

## 2024-06-28 MED ORDER — ACETAMINOPHEN 10 MG/ML IV SOLN
INTRAVENOUS | Status: AC
  Filled 2024-06-28: qty 100

## 2024-06-28 MED ORDER — GABAPENTIN 100 MG PO CAPS
100.0000 mg | ORAL_CAPSULE | Freq: Three times a day (TID) | ORAL | Status: DC
  Administered 2024-06-28 – 2024-06-30 (×5): 100 mg via ORAL
  Filled 2024-06-28 (×5): qty 1

## 2024-06-28 MED ORDER — COCONUT OIL OIL: 1.0000 | TOPICAL_OIL | Status: DC | PRN

## 2024-06-28 MED ORDER — DEXAMETHASONE SODIUM PHOSPHATE 10 MG/ML IJ SOLN
INTRAMUSCULAR | Status: DC | PRN
  Administered 2024-06-28: 10 mg via INTRAVENOUS

## 2024-06-28 MED ORDER — KETOROLAC TROMETHAMINE 30 MG/ML IJ SOLN: 30.0000 mg | Freq: Four times a day (QID) | INTRAMUSCULAR | Status: DC

## 2024-06-28 MED ORDER — SIMETHICONE 80 MG PO CHEW
80.0000 mg | CHEWABLE_TABLET | Freq: Three times a day (TID) | ORAL | Status: DC
  Administered 2024-06-28 – 2024-06-30 (×4): 80 mg via ORAL
  Filled 2024-06-28 (×3): qty 1

## 2024-06-28 MED ORDER — DIPHENHYDRAMINE HCL 25 MG PO CAPS
25.0000 mg | ORAL_CAPSULE | ORAL | Status: DC | PRN
  Administered 2024-06-28: 25 mg via ORAL
  Filled 2024-06-28: qty 1

## 2024-06-28 MED ORDER — BUPIVACAINE IN DEXTROSE 0.75-8.25 % IT SOLN
INTRATHECAL | Status: DC | PRN
  Administered 2024-06-28: 1.6 mL via INTRATHECAL

## 2024-06-28 MED ORDER — TRANEXAMIC ACID-NACL 1000-0.7 MG/100ML-% IV SOLN
INTRAVENOUS | Status: DC | PRN
  Administered 2024-06-28: 1000 mg via INTRAVENOUS

## 2024-06-28 MED ORDER — SIMETHICONE 80 MG PO CHEW
80.0000 mg | CHEWABLE_TABLET | ORAL | Status: DC | PRN
  Filled 2024-06-28: qty 1

## 2024-06-28 MED ORDER — CEFAZOLIN SODIUM-DEXTROSE 2-4 GM/100ML-% IV SOLN
INTRAVENOUS | Status: AC
  Filled 2024-06-28: qty 100

## 2024-06-28 MED ORDER — MENTHOL 3 MG MT LOZG: 1.0000 | LOZENGE | OROMUCOSAL | Status: DC | PRN

## 2024-06-28 MED ORDER — ACETAMINOPHEN 10 MG/ML IV SOLN
INTRAVENOUS | Status: DC | PRN
Start: 2024-06-28 — End: 2024-06-28
  Administered 2024-06-28: 1000 mg via INTRAVENOUS

## 2024-06-28 MED ORDER — KETOROLAC TROMETHAMINE 30 MG/ML IJ SOLN
INTRAMUSCULAR | Status: DC | PRN
Start: 2024-06-28 — End: 2024-06-28
  Administered 2024-06-28: 30 mg via INTRAVENOUS

## 2024-06-28 MED ORDER — DEXMEDETOMIDINE HCL IN NACL 80 MCG/20ML IV SOLN
INTRAVENOUS | Status: AC
Start: 2024-06-28 — End: 2024-06-28
  Filled 2024-06-28: qty 20

## 2024-06-28 MED ORDER — SOD CITRATE-CITRIC ACID 500-334 MG/5ML PO SOLN
ORAL | Status: AC
Start: 2024-06-28 — End: 2024-06-28
  Filled 2024-06-28: qty 30

## 2024-06-28 MED ORDER — LACTATED RINGERS IV SOLN
INTRAVENOUS | Status: DC | PRN
Start: 2024-06-28 — End: 2024-06-28

## 2024-06-28 MED ORDER — ONDANSETRON HCL 4 MG/2ML IJ SOLN
INTRAMUSCULAR | Status: DC | PRN
  Administered 2024-06-28: 4 mg via INTRAVENOUS

## 2024-06-28 MED ORDER — NALOXONE HCL 4 MG/10ML IJ SOLN: 1.0000 ug/kg/h | INTRAVENOUS | Status: DC | PRN

## 2024-06-28 MED ORDER — DIPHENHYDRAMINE HCL 50 MG/ML IJ SOLN: 12.5000 mg | INTRAMUSCULAR | Status: DC | PRN

## 2024-06-28 MED ORDER — FENTANYL CITRATE (PF) 100 MCG/2ML IJ SOLN
INTRAMUSCULAR | Status: AC
  Filled 2024-06-28: qty 2

## 2024-06-28 MED ORDER — TRANEXAMIC ACID-NACL 1000-0.7 MG/100ML-% IV SOLN
INTRAVENOUS | Status: AC
Start: 2024-06-28 — End: 2024-06-28
  Filled 2024-06-28: qty 100

## 2024-06-28 MED ORDER — SOD CITRATE-CITRIC ACID 500-334 MG/5ML PO SOLN
30.0000 mL | ORAL | Status: AC
  Administered 2024-06-28: 30 mL via ORAL

## 2024-06-28 MED ORDER — NALOXONE HCL 0.4 MG/ML IJ SOLN: 0.4000 mg | INTRAMUSCULAR | Status: DC | PRN

## 2024-06-28 MED ORDER — MORPHINE SULFATE (PF) 0.5 MG/ML IJ SOLN
INTRAMUSCULAR | Status: DC | PRN
  Administered 2024-06-28: 150 ug via INTRATHECAL

## 2024-06-28 MED ORDER — OXYCODONE HCL 5 MG PO TABS: 5.0000 mg | ORAL_TABLET | Freq: Once | ORAL | Status: DC | PRN

## 2024-06-28 MED ORDER — ACETAMINOPHEN 500 MG PO TABS
1000.0000 mg | ORAL_TABLET | Freq: Four times a day (QID) | ORAL | Status: AC
  Administered 2024-06-28 – 2024-06-29 (×4): 1000 mg via ORAL
  Filled 2024-06-28 (×4): qty 2

## 2024-06-28 MED ORDER — OXYTOCIN-SODIUM CHLORIDE 30-0.9 UT/500ML-% IV SOLN: 2.5000 [IU]/h | INTRAVENOUS | Status: AC

## 2024-06-28 MED ORDER — DIPHENHYDRAMINE HCL 25 MG PO CAPS: 25.0000 mg | ORAL_CAPSULE | Freq: Four times a day (QID) | ORAL | Status: DC | PRN

## 2024-06-28 MED ORDER — DEXAMETHASONE SODIUM PHOSPHATE 10 MG/ML IJ SOLN
INTRAMUSCULAR | Status: AC
  Filled 2024-06-28: qty 1

## 2024-06-28 MED ORDER — OXYTOCIN-SODIUM CHLORIDE 30-0.9 UT/500ML-% IV SOLN
INTRAVENOUS | Status: DC | PRN
  Administered 2024-06-28: 30 [IU] via INTRAVENOUS

## 2024-06-28 MED ORDER — CEFAZOLIN SODIUM-DEXTROSE 2-4 GM/100ML-% IV SOLN
2.0000 g | INTRAVENOUS | Status: AC
  Administered 2024-06-28: 2 g via INTRAVENOUS

## 2024-06-28 MED ORDER — OXYTOCIN-SODIUM CHLORIDE 30-0.9 UT/500ML-% IV SOLN
INTRAVENOUS | Status: AC
  Filled 2024-06-28: qty 500

## 2024-06-28 MED ORDER — KETOROLAC TROMETHAMINE 30 MG/ML IJ SOLN
INTRAMUSCULAR | Status: AC
  Filled 2024-06-28: qty 1

## 2024-06-28 MED ORDER — DIBUCAINE (PERIANAL) 1 % EX OINT: 1.0000 | TOPICAL_OINTMENT | CUTANEOUS | Status: DC | PRN

## 2024-06-28 MED ORDER — DEXMEDETOMIDINE HCL IN NACL 80 MCG/20ML IV SOLN
INTRAVENOUS | Status: DC | PRN
  Administered 2024-06-28: 12 ug via INTRAVENOUS

## 2024-06-28 MED ORDER — KETOROLAC TROMETHAMINE 30 MG/ML IJ SOLN
30.0000 mg | Freq: Four times a day (QID) | INTRAMUSCULAR | Status: AC
  Administered 2024-06-28 – 2024-06-29 (×3): 30 mg via INTRAVENOUS
  Filled 2024-06-28 (×3): qty 1

## 2024-06-28 MED ORDER — PRENATAL MULTIVITAMIN CH
1.0000 | ORAL_TABLET | Freq: Every day | ORAL | Status: DC
  Administered 2024-06-29 – 2024-06-30 (×2): 1 via ORAL
  Filled 2024-06-28 (×2): qty 1

## 2024-06-28 MED ORDER — SODIUM CHLORIDE 0.9% FLUSH: 3.0000 mL | INTRAVENOUS | Status: DC | PRN

## 2024-06-28 MED ORDER — FENTANYL CITRATE (PF) 100 MCG/2ML IJ SOLN
INTRAMUSCULAR | Status: DC | PRN
  Administered 2024-06-28: 15 ug via INTRATHECAL

## 2024-06-28 MED ORDER — ONDANSETRON HCL 4 MG/2ML IJ SOLN
INTRAMUSCULAR | Status: AC
  Filled 2024-06-28: qty 2

## 2024-06-28 MED ORDER — ENOXAPARIN SODIUM 40 MG/0.4ML IJ SOSY
40.0000 mg | PREFILLED_SYRINGE | INTRAMUSCULAR | Status: DC
  Administered 2024-06-29 – 2024-06-30 (×2): 40 mg via SUBCUTANEOUS
  Filled 2024-06-28 (×2): qty 0.4

## 2024-06-28 MED ORDER — DROPERIDOL 2.5 MG/ML IJ SOLN: 0.6250 mg | Freq: Once | INTRAMUSCULAR | Status: DC | PRN

## 2024-06-28 MED ORDER — ONDANSETRON HCL 4 MG/2ML IJ SOLN: 4.0000 mg | Freq: Three times a day (TID) | INTRAMUSCULAR | Status: DC | PRN

## 2024-06-28 MED ORDER — SENNOSIDES-DOCUSATE SODIUM 8.6-50 MG PO TABS
2.0000 | ORAL_TABLET | Freq: Every day | ORAL | Status: DC
  Administered 2024-06-30: 2 via ORAL
  Filled 2024-06-28: qty 2

## 2024-06-28 MED ORDER — OXYCODONE HCL 5 MG/5ML PO SOLN: 5.0000 mg | Freq: Once | ORAL | Status: DC | PRN

## 2024-06-28 MED ORDER — FENTANYL CITRATE (PF) 100 MCG/2ML IJ SOLN: 25.0000 ug | INTRAMUSCULAR | Status: DC | PRN

## 2024-06-28 MED ORDER — MORPHINE SULFATE (PF) 0.5 MG/ML IJ SOLN
INTRAMUSCULAR | Status: AC
  Filled 2024-06-28: qty 10

## 2024-06-28 SURGICAL SUPPLY — 28 items
BENZOIN TINCTURE PRP APPL 2/3 (GAUZE/BANDAGES/DRESSINGS) IMPLANT
CHLORAPREP W/TINT 26 (MISCELLANEOUS) ×4 IMPLANT
CLAMP UMBILICAL CORD (MISCELLANEOUS) ×2 IMPLANT
CLOTH BEACON ORANGE TIMEOUT ST (SAFETY) ×2 IMPLANT
DRESSING PREVENA PLUS CUSTOM (GAUZE/BANDAGES/DRESSINGS) IMPLANT
DRSG OPSITE POSTOP 4X10 (GAUZE/BANDAGES/DRESSINGS) ×2 IMPLANT
ELECTRODE REM PT RTRN 9FT ADLT (ELECTROSURGICAL) ×2 IMPLANT
EXTRACTOR VACUUM BELL STYLE (SUCTIONS) IMPLANT
GLOVE BIOGEL PI IND STRL 6.5 (GLOVE) ×4 IMPLANT
GLOVE ECLIPSE 6.5 STRL STRAW (GLOVE) ×4 IMPLANT
GOWN STRL REUS W/TWL LRG LVL3 (GOWN DISPOSABLE) ×6 IMPLANT
HEMOSTAT ARISTA ABSORB 1G (HEMOSTASIS) IMPLANT
KIT ABG SYR 3ML LUER SLIP (SYRINGE) IMPLANT
NDL HYPO 25X1 1.5 SAFETY (NEEDLE) IMPLANT
NEEDLE HYPO 22GX1.5 SAFETY (NEEDLE) ×2 IMPLANT
NEEDLE HYPO 25X1 1.5 SAFETY (NEEDLE) IMPLANT
NS IRRIG 1000ML POUR BTL (IV SOLUTION) ×2 IMPLANT
PACK C SECTION WH (CUSTOM PROCEDURE TRAY) ×2 IMPLANT
PAD OB MATERNITY 4.3X12.25 (PERSONAL CARE ITEMS) ×2 IMPLANT
STRIP CLOSURE SKIN 1/2X4 (GAUZE/BANDAGES/DRESSINGS) IMPLANT
SUT MON AB 4-0 PS1 27 (SUTURE) ×2 IMPLANT
SUT PLAIN ABS 2-0 CT1 27XMFL (SUTURE) ×2 IMPLANT
SUT VIC AB 0 CT1 36 (SUTURE) ×4 IMPLANT
SUT VIC AB 0 CTX36XBRD ANBCTRL (SUTURE) ×2 IMPLANT
SYR CONTROL 10ML LL (SYRINGE) ×2 IMPLANT
TOWEL OR 17X24 6PK STRL BLUE (TOWEL DISPOSABLE) ×2 IMPLANT
TRAY FOLEY W/BAG SLVR 14FR LF (SET/KITS/TRAYS/PACK) ×2 IMPLANT
WATER STERILE IRR 1000ML POUR (IV SOLUTION) ×2 IMPLANT

## 2024-06-28 NOTE — Anesthesia Preprocedure Evaluation (Signed)
 Anesthesia Evaluation  Patient identified by MRN, date of birth, ID band Patient awake  General Assessment Comment:  Third c/s.  Reviewed: Allergy & Precautions, NPO status , Patient's Chart, lab work & pertinent test results  History of Anesthesia Complications Negative for: history of anesthetic complications  Airway Mallampati: II  TM Distance: >3 FB Neck ROM: Full    Dental no notable dental hx. (+) Teeth Intact   Pulmonary neg pulmonary ROS, neg sleep apnea, neg COPD, Patient abstained from smoking.Not current smoker   Pulmonary exam normal breath sounds clear to auscultation       Cardiovascular Exercise Tolerance: Good METS(-) hypertension(-) CAD and (-) Past MI negative cardio ROS (-) dysrhythmias  Rhythm:Regular Rate:Normal - Systolic murmurs    Neuro/Psych negative neurological ROS  negative psych ROS   GI/Hepatic ,neg GERD  ,,(+)     (-) substance abuse    Endo/Other  diabetes, Gestational, Insulin  Dependent    Renal/GU negative Renal ROS     Musculoskeletal   Abdominal   Peds  Hematology Denies blood thinner use or bleeding disorders.    Anesthesia Other Findings Denies blood thinner use or bleeding diatheses. Recent labs reviewed. Past Medical History: No date: Gestational diabetes   Reproductive/Obstetrics (+) Pregnancy                              Anesthesia Physical Anesthesia Plan  ASA: 3  Anesthesia Plan: Spinal   Post-op Pain Management:    Induction:   PONV Risk Score and Plan: 4 or greater and Ondansetron  and Dexamethasone   Airway Management Planned: Natural Airway  Additional Equipment:   Intra-op Plan:   Post-operative Plan:   Informed Consent: I have reviewed the patients History and Physical, chart, labs and discussed the procedure including the risks, benefits and alternatives for the proposed anesthesia with the patient or authorized  representative who has indicated his/her understanding and acceptance.       Plan Discussed with: CRNA and Surgeon  Anesthesia Plan Comments: (Discussed R/B/A of neuraxial anesthesia technique with patient: - rare risks of spinal/epidural hematoma, nerve damage, infection - Risk of PDPH - Risk of itching - Risk of nausea and vomiting - Risk of conversion to general anesthesia and its associated risks, including sore throat, damage to lips/teeth/oropharynx, and rare risks such as cardiac and respiratory events. - Risk of surgical bleeding requiring blood products - Risk of allergic reactions Discussed the role of CRNA in patient's perioperative care.  Patient voiced understanding.)        Anesthesia Quick Evaluation

## 2024-06-28 NOTE — Discharge Summary (Shared)
 Postpartum Discharge Summary  Date of Service updated***     Patient Name: Maureen Cameron DOB: 1990/12/01 MRN: [id number]  Date of admission: 06/28/2024 Delivery date:06/28/2024 Delivering provider: NICHOLAUS BURNARD HERO Date of discharge: 06/28/2024  Admitting diagnosis: Maternal care due to low transverse uterine scar from previous cesarean delivery [O34.211] S/P repeat low transverse C-section [Z98.891] Intrauterine pregnancy: [redacted]w[redacted]d     Secondary diagnosis:  Principal Problem:   S/P repeat low transverse C-section Active Problems:   History of 3 cesarean sections   Hx of tubal ligation  Additional problems: GDM    Discharge diagnosis: Term Pregnancy Delivered and GDM A2                                              Post partum procedures:{Postpartum procedures:23558} Augmentation: N/A Complications: None  Hospital course: Sceduled C/S   33 y.o. yo H5E6986 at [redacted]w[redacted]d was admitted to the hospital 06/28/2024 for scheduled cesarean section with the following indication:Elective Repeat.Delivery details are as follows:  Membrane Rupture Time/Date:  ,   Delivery Method:C-Section, Low Transverse Operative Delivery:N/A Details of operation can be found in separate operative note.  Patient had a postpartum course complicated by***.  She is ambulating, tolerating a regular diet, passing flatus, and urinating well. Patient is discharged home in stable condition on  06/28/24        Newborn Data: Birth date:06/28/2024 Birth time:12:48 PM Gender:Female Living status:Living Apgars:8 ,9  Weight:3160 g    Magnesium  Sulfate received: No BMZ received: No Rhophylac:N/A MMR:N/A T-DaP:Given prenatally Flu: No RSV Vaccine received: No Transfusion:{Transfusion received:30440034}  Immunizations received: Immunization History  Administered Date(s) Administered   Influenza, Seasonal, Injecte, Preservative Fre 06/20/2024   Influenza,inj,Quad PF,6+ Mos 07/01/2020   Influenza-Unspecified 08/06/2015    MMR 11/22/2015   Tdap 09/20/2015, 07/01/2020, 05/01/2024    Physical exam  Vitals:   06/28/24 1008  BP: 120/75  Pulse: 84  Resp: 17  Temp: 98.2 F (36.8 C)  TempSrc: Oral  SpO2: 98%  Weight: 80.1 kg  Height: 5' 4 (1.626 m)   General: {Exam; general:21111117} Lochia: {Desc; appropriate/inappropriate:30686::appropriate} Uterine Fundus: {Desc; firm/soft:30687} Incision: {Exam; incision:21111123} DVT Evaluation: {Exam; dvt:2111122} Labs: Lab Results  Component Value Date   WBC 6.1 06/26/2024   HGB 13.2 06/26/2024   HCT 39.9 06/26/2024   MCV 78.1 (L) 06/26/2024   PLT 260 06/26/2024      Latest Ref Rng & Units 09/12/2020    5:14 AM  CMP  Glucose 70 - 99 mg/dL 74   BUN 6 - 20 mg/dL 5   Creatinine 9.55 - 8.99 mg/dL 9.40   Sodium 864 - 854 mmol/L 135   Potassium 3.5 - 5.1 mmol/L 4.0   Chloride 98 - 111 mmol/L 104   CO2 22 - 32 mmol/L 22   Calcium 8.9 - 10.3 mg/dL 8.6   Total Protein 6.5 - 8.1 g/dL 5.0   Total Bilirubin 0.3 - 1.2 mg/dL 0.4   Alkaline Phos 38 - 126 U/L 107   AST 15 - 41 U/L 27   ALT 0 - 44 U/L 27    Edinburgh Score:    12/06/2022    3:35 PM  Edinburgh Postnatal Depression Scale Screening Tool  I have been able to laugh and see the funny side of things. 0  I have looked forward with enjoyment to things. 1  I have blamed  myself unnecessarily when things went wrong. 2  I have been anxious or worried for no good reason. 1  I have felt scared or panicky for no good reason. 2  Things have been getting on top of me. 0  I have been so unhappy that I have had difficulty sleeping. 2  I have felt sad or miserable. 2  I have been so unhappy that I have been crying. 1  The thought of harming myself has occurred to me. 0  Edinburgh Postnatal Depression Scale Total 11      Data saved with a previous flowsheet row definition   No data recorded  After visit meds:  Allergies as of 06/28/2024   No Known Allergies   Med Rec must be completed prior to using  this [facility]***        Discharge home in stable condition Infant Feeding: Breast Infant Disposition:home with mother Discharge instruction: per After Visit Summary and Postpartum booklet. Activity: Advance as tolerated. Pelvic rest for 6 weeks.  Diet: routine diet Future Appointments: Future Appointments  Date Time Provider Department Center  06/30/2024 11:15 AM [facility] PROVIDER 1 [facility]  06/30/2024 11:30 AM [facility] US2 [facility]  07/03/2024  1:15 PM [facility] US2 [facility]  07/04/2024  8:15 AM Eveline Lynwood MATSU, MD [facility]  07/10/2024  1:15 PM [facility] US2 [facility]  07/11/2024 10:15 AM Anyanwu, Gloris LABOR, MD [facility]  07/17/2024  1:15 PM [facility] US2 [facility]  07/18/2024  9:35 AM Cresenzo, Norleen GAILS, MD [facility]   Follow up Visit: Note sent to [facility] 9/13  Please schedule this patient for a In person postpartum visit in 4-6 weeks with the following provider: Any provider. Additional Postpartum F/U:2 hour GTT and Incision check 1 week  High risk pregnancy complicated by: [id number] Delivery mode:  C-Section, Low Transverse Anticipated Birth Control:  s/p b/l tubal ligation   06/28/2024 Leeroy KATHEE Pouch, MD

## 2024-06-28 NOTE — Lactation Note (Signed)
 This note was copied from a baby's chart. Lactation Consultation Note  Patient Name: Maureen Cameron Unijb'd Date: 06/28/2024 Age:35 hours Reason for consult: Initial assessment;Mother's request;Early term 37-38.6wks;Maternal endocrine disorder;Breastfeeding assistance  P3- MOB requested assistance with latching infant. Per RN, infant would not suck on MOB's breast or RN's finger. Instead infant would tongue thrust. Infant had just had a bottle feeding of 15 mL of formula, so LC warned MOB that infant may not latch for us  at this time. MOB verbalized understanding. LC first assessed infant's suck with a gloved finger. Infant at first tongue thrusted, then after 2 seconds he started nutritively sucking. LC assisted with placing infant on the right breast in the cross cradle hold. Infant was not willing to latch or suck. We tried for 10 minutes before agreeing to stop. LC reported that it was reassuring that infant was able to suck on my finger. LC encouraged MOB to immediately put infant to breast for next feeding before offering formula or EBM.  LC offered to set up a DEBP or manual pump for when infant does not latch. MOB agreed to using a manual pump. LC reviewed how to use it and clean it. LC reviewed the first 24 hr birthday nap, day 2 cluster feeding, feeding infant on cue 8-12x in 24 hrs, not allowing infant to go over 3 hrs without a feeding, CDC milk storage guidelines, LC services handout and engorgement/breast care. LC encouraged MOB to call for further assistance as needed.  Maternal Data Has patient been taught Hand Expression?: Yes Does the patient have breastfeeding experience prior to this delivery?: Yes How long did the patient breastfeed?: 6 months with first child and 8 months with second child  Feeding Mother's Current Feeding Choice: Breast Milk Nipple Type: Nfant Slow Flow (purple)  LATCH Score Latch: Too sleepy or reluctant, no latch achieved, no sucking  elicited.  Audible Swallowing: None  Type of Nipple: Everted at rest and after stimulation  Comfort (Breast/Nipple): Soft / non-tender  Hold (Positioning): Full assist, staff holds infant at breast  LATCH Score: 4   Lactation Tools Discussed/Used Tools: Pump;Flanges Flange Size: 18;21 Breast pump type: Manual Pump Education: Setup, frequency, and cleaning;Milk Storage Reason for Pumping: MOB request Pumping frequency: 15-20 min every 3 hrs  Interventions Interventions: Breast feeding basics reviewed;Assisted with latch;Hand express;Breast compression;Adjust position;Support pillows;Position options;Hand pump;Education;LC Services brochure  Discharge Discharge Education: Engorgement and breast care;Warning signs for feeding baby Pump: Manual;Hands Free;Personal  Consult Status Consult Status: Follow-up Date: 06/29/24 Follow-up type: In-patient    Recardo Hoit BS, IBCLC 06/28/2024, 6:49 PM

## 2024-06-28 NOTE — Anesthesia Procedure Notes (Signed)
 Spinal  Patient location during procedure: OR Start time: 06/28/2024 12:08 PM End time: 06/28/2024 12:11 PM Reason for block: surgical anesthesia Staffing Performed: anesthesiologist  Anesthesiologist: Boone Fess, MD Performed by: Boone Fess, MD Authorized by: Boone Fess, MD   Preanesthetic Checklist Completed: patient identified, IV checked, site marked, risks and benefits discussed, surgical consent, monitors and equipment checked, pre-op evaluation and timeout performed Spinal Block Patient position: sitting Prep: ChloraPrep and site prepped and draped Patient monitoring: heart rate, continuous pulse ox, blood pressure and cardiac monitor Approach: midline Location: L3-4 Injection technique: single-shot Needle Needle type: Whitacre and Introducer  Needle gauge: 24 G Needle length: 9 cm Assessment Sensory level: T10 Events: CSF return Additional Notes Meticulous sterile technique used throughout (CHG prep, sterile gloves, sterile drape). Negative paresthesia. Negative blood return. Positive free-flowing CSF. Expiration date of kit checked and confirmed. Patient tolerated procedure well, without complications.

## 2024-06-28 NOTE — Anesthesia Postprocedure Evaluation (Signed)
 Anesthesia Post Note  Patient: Radio producer  Procedure(s) Performed: CESAREAN DELIVERY SALPINGECTOMY, BILATERAL, OPEN (Bilateral: Abdomen)     Patient location during evaluation: L&D Anesthesia Type: Spinal Level of consciousness: oriented and awake and alert Pain management: pain level controlled Vital Signs Assessment: post-procedure vital signs reviewed and stable Respiratory status: spontaneous breathing, respiratory function stable and patient connected to nasal cannula oxygen Cardiovascular status: blood pressure returned to baseline and stable Postop Assessment: no headache, no backache, no apparent nausea or vomiting and spinal receding Anesthetic complications: no   No notable events documented.  Last Vitals:  Vitals:   06/28/24 1445 06/28/24 1500  BP: (!) 101/58 106/62  Pulse: 68 60  Resp: 12 18  Temp:    SpO2: 99% 98%    Last Pain:  Vitals:   06/28/24 1500  TempSrc:   PainSc: 0-No pain   Pain Goal:    LLE Motor Response: Purposeful movement (06/28/24 1500) LLE Sensation: Tingling (06/28/24 1500) RLE Motor Response: Purposeful movement (06/28/24 1500) RLE Sensation: Tingling (06/28/24 1500)        Rome Ade

## 2024-06-28 NOTE — Transfer of Care (Signed)
 Immediate Anesthesia Transfer of Care Note  Patient: Maureen Cameron  Procedure(s) Performed: CESAREAN DELIVERY SALPINGECTOMY, BILATERAL, OPEN (Bilateral: Abdomen)  Patient Location: PACU  Anesthesia Type:Spinal  Level of Consciousness: awake, alert , and oriented  Airway & Oxygen Therapy: Patient Spontanous Breathing  Post-op Assessment: Report given to RN and Post -op Vital signs reviewed and stable  Post vital signs: Reviewed and stable  Last Vitals:  Vitals Value Taken Time  BP 92/56 06/28/24 14:00  Temp    Pulse 68 06/28/24 14:01  Resp 14 06/28/24 14:01  SpO2 99 % 06/28/24 14:01  Vitals shown include unfiled device data.  Last Pain:  Vitals:   06/28/24 1008  TempSrc: Oral  PainSc: 0-No pain         Complications: No notable events documented.

## 2024-06-28 NOTE — Op Note (Addendum)
 Maureen Cameron PROCEDURE DATE: 06/28/2024  PREOPERATIVE DIAGNOSES: Intrauterine pregnancy at [redacted]w[redacted]d weeks gestation; patient declines vag del attempt, 2 prior CS, undesired fertility  POSTOPERATIVE DIAGNOSES: The same  PROCEDURE: Repeat Low Transverse Cesarean Section, bilateral tubal ligation  SURGEON:  LOIS Yolanda Moats, MD  ASSISTANT:  Leeroy Pouch, MD  An experienced assistant was required given the standard of surgical care given the complexity of the case.  This assistant was needed for exposure, dissection, suctioning, retraction, instrument exchange, assisting with delivery with administration of fundal pressure, and for overall help during the procedure.  ANESTHESIOLOGY TEAM: Anesthesiologist: Boone Fess, MD CRNA: Edelmiro Elenor NOVAK, CRNA  INDICATIONS: Maureen Cameron is a 33 y.o. [phone] at [redacted]w[redacted]d here for cesarean section secondary to the indications listed under preoperative diagnoses; please see preoperative note for further details.  The risks of cesarean section were discussed with the patient including but were not limited to: bleeding which may require transfusion or reoperation; infection which may require antibiotics; injury to bowel, bladder, ureters or other surrounding organs; injury to the fetus; need for additional procedures including hysterectomy in the event of a life-threatening hemorrhage; placental abnormalities wth subsequent pregnancies, incisional problems, thromboembolic phenomenon and other postoperative/anesthesia complications.  She consents to blood transfusion in the event of an emergency. The patient verbalized understanding of the plan, giving informed written consent for the procedure.    FINDINGS:  Viable female infant in cephalic presentation.  Apgars 8 and 9. Weight: 3160g. Clear amniotic fluid.  Spontaneously removed intact placenta, three vessel cord.  Bladder tacked onto lower uterine segment, otherwise normal uterus, fallopian tubes and ovaries  bilaterally.  ANESTHESIA: Spinal INTRAVENOUS FLUIDS: 1200 ml   ESTIMATED BLOOD LOSS: 425 ml URINE OUTPUT:  150 ml SPECIMENS: Placenta sent to L&D and fallopian tubes sent to pathology COMPLICATIONS: None immediate  PROCEDURE IN DETAIL:  The patient preoperatively received intravenous antibiotics and had sequential compression devices applied to her lower extremities.  She was then taken to the operating room where spinal anesthesia was administered and was found to be adequate. She was then placed in a dorsal supine position with a leftward tilt. She was prepped and draped in a sterile manner.  A foley catheter was placed into her bladder with sterile technique and attached to constant gravity.  After a timeout was performed, a Pfannenstiel skin incision was made with scalpel over her preexisting scar and carried through to the underlying layer of fascia. The fascia was incised in the midline, and this incision was extended bilaterally using the Mayo scissors.  Kocher clamps were applied to the superior aspect of the fascial incision and the underlying rectus muscles were dissected off bluntly and sharply.  A similar process was carried out on the inferior aspect of the fascial incision. Mild adhesions noted. The rectus muscles were separated in the midline bluntly and the peritoneum was entered bluntly. The peritoneal incision was carefully extended bluntly laterally and caudad with good visualization of the bladder. The uterus appeared normal. The Alexis O-ring retractor was placed into the incision, taking care not to incorporate bowel or omentum. Bladder adhesions to the uterus were taken down. The bladder blade was inserted. Attention was turned to the lower uterine segment where a low transverse hysterotomy was made with a scalpel and extended bilaterally bluntly.  The infant was delivered from cephalad position, nose and mouth were bulb suctioned, and the cord clamped and cut after 1 minute. The  infant was then handed over to the waiting neonatology team. Uterine  massage was then performed, and the placenta delivered intact with a three-vessel cord. The uterus was then cleared of clots and debris.  The hysterotomy was closed with 0 Vicryl in a running locked fashion, and an imbricating layer was also placed with 0 Vicryl a quarter length of hysterotomy to control bleeding.   Attention was turned to the fallopian tubes. Fallopian tubes and ovaries were visualized bilaterally and normal appearing. The right fallopian tube was grasped with babcocks and followed out to the fimbriated end. The distal 5 cm portion of the tube and mesosalpinz was doubly clamped using Nimah Uphoff clamps and the distal portion of the tube removed using Metzenbaum scissors. The remaining pedicle was suture ligated using a 2-0 Vicryl free tie and a subsequent suture ligation with 2-0 Vicryl. The pedicle was inspected and found to be hemostatic. The same procedure was completed with the left tube. The excised portions of tubes were sent to pathology.   Then the hysterotomy and tubal ligation sites were check and hemostasis confirmed. The Alexis retractor was removed.  The pelvis was cleared of all clot and debris. Arista was placed over the hysterotomy for additional hemostasis.  Hemostasis was again confirmed on all surfaces. The fascia was then closed using 0 PDS in a running fashion.  The subcutaneous layer was irrigated, then reapproximated with 2-0 plain gut.  The skin was closed with a 4-0 Monocryl subcuticular stitch. The patient tolerated the procedure well. Sponge, lap, instrument and needle counts were correct x 3.  She was taken to the recovery room in stable condition.    Leeroy Pouch, M.D. Family Medicine-OB Fellow [facility] for [employer], [facility]   Attestation of Attending Supervision of MAINE Fellow: Evaluation, management, and procedures were performed by the [facility] Fellow  under my supervision and collaboration. I was scrubbed and present for all portions of this procedure. I agree with the documentation and plan.  LOIS Yolanda Moats, MD, [facility] Attending Center for [employer] (Faculty Practice)  06/28/2024 2:32 PM

## 2024-06-28 NOTE — H&P (Signed)
 Obstetric Preoperative History and Physical  Maureen Cameron is a 33 y.o. [id number] with IUP at [redacted]w[redacted]d presenting for scheduled cesarean section and bilateral tubal ligation.  No acute concerns.   Prenatal Course Source of Care: [facility] with onset of care at 11 weeks Pregnancy complications or risks: Patient Active Problem List   Diagnosis Date Noted   S/P repeat low transverse C-section 06/28/2024   Supervision of high risk pregnancy, antepartum 12/27/2023   Gestational diabetes mellitus (GDM) affecting third pregnancy 07/02/2020   Alpha thalassemia silent carrier 06/04/2020   History of 2 cesarean sections 05/12/2020   Maternal varicella, non-immune 10/04/2015   She plans to breastfeed She desires bilateral tubal ligation for postpartum contraception.   Prenatal labs and studies: ABO, Rh: --/--/AB POS (09/11 1032) Antibody: NEG (09/11 1032) Rubella: 1.15 (03/20 1603) RPR: NON REACTIVE (09/11 1025)  HBsAg: Negative (03/20 1603)  HIV: NON REACTIVE (09/11 1025)  HAD:Wzhjupcz/-- (09/05 1215) 2 hr Glucola  abnormal Genetic screening normal Anatomy US  abnormal with LGA  Prenatal Transfer Tool  Fetal Ultrasounds or other Referrals:  Referred to Materal Fetal Medicine  Maternal Substance Abuse:  No Significant Maternal Medications:  Meds include: Other: metformin  and insulin  Significant Maternal Lab Results: Group B Strep negative  Past Medical History:  Diagnosis Date   Gestational diabetes     Past Surgical History:  Procedure Laterality Date   CESAREAN SECTION N/A 11/19/2015   Procedure: CESAREAN SECTION;  Surgeon: Glenys GORMAN Birk, MD;  Location: [facility] ORS;  Service: Obstetrics;  Laterality: N/A;   CESAREAN SECTION N/A 09/11/2020   Procedure: REPEAT CESAREAN SECTION;  Surgeon: Herchel Gloris LABOR, MD;  Location: [facility] LD ORS;  Service: Obstetrics;  Laterality: N/A;    OB History  Gravida Para Term Preterm AB Living  4 2 2  0 1 2  SAB IAB Ectopic Multiple Live Births  1 0 0 0 2     # Outcome Date GA Lbr Len/2nd Weight Sex Type Anes PTL Lv  4 Current           3 SAB 11/25/22 [redacted]w[redacted]d       FD  2 Term 09/11/20 [redacted]w[redacted]d  3695 g F CS-LTranv Spinal  LIV     Complications: Gestational diabetes  1 Term 11/19/15 [redacted]w[redacted]d 26:28 / 09:02 3490 g F CS-LTranv EPI  LIV     Complications: Gestational diabetes    Social History   Socioeconomic History   Marital status: Married    Spouse name: Not on file   Number of children: Not on file   Years of education: Not on file   Highest education level: Bachelor's degree (e.g., BA, AB, BS)  Occupational History   Not on file  Tobacco Use   Smoking status: Never   Smokeless tobacco: Never  Vaping Use   Vaping status: Never Used  Substance and Sexual Activity   Alcohol use: No   Drug use: No   Sexual activity: Yes    Birth control/protection: None  Other Topics Concern   Not on file  Social History Narrative   Not on file   Social Drivers of Health   Financial Resource Strain: Low Risk  (05/30/2024)   Overall Financial Resource Strain (CARDIA)    Difficulty of Paying Living Expenses: Not hard at all  Food Insecurity: No Food Insecurity (06/28/2024)   Hunger Vital Sign    Worried About Running Out of Food in the Last Year: Never true    Ran Out of Food in the Last  Year: Never true  Transportation Needs: No Transportation Needs (06/28/2024)   PRAPARE - Administrator, Civil Service (Medical): No    Lack of Transportation (Non-Medical): No  Physical Activity: Unknown (05/30/2024)   Exercise Vital Sign    Days of Exercise per Week: Patient declined    Minutes of Exercise per Session: Not on file  Stress: No Stress Concern Present (05/30/2024)   [employer] of Occupational Health - Occupational Stress Questionnaire    Feeling of Stress: Not at all  Social Connections: Moderately Integrated (05/30/2024)   Social Connection and Isolation Panel    Frequency of Communication with Friends and Family: Three times a week     Frequency of Social Gatherings with Friends and Family: Three times a week    Attends Religious Services: More than 4 times per year    Active Member of Clubs or Organizations: No    Attends Engineer, structural: Not on file    Marital Status: Married    Family History  Problem Relation Age of Onset   Heart disease Mother    Hypertension Mother    Diabetes Mother    Hypertension Father     Medications Prior to Admission  Medication Sig Dispense Refill Last Dose/Taking   Accu-Chek Softclix Lancets lancets Use as instructed 100 each 12 06/27/2024   Blood Glucose Monitoring Suppl (ACCU-CHEK GUIDE) w/Device KIT 1 Device by Does not apply route as needed. 1 kit 0 06/27/2024   glucose blood (ACCU-CHEK GUIDE TEST) test strip Use as instructed 100 each 12 06/27/2024   insulin  aspart (FIASP ) 100 UNIT/ML FlexTouch Pen Inject 4 Units into the skin daily with supper. 15 mL 1 06/27/2024   Insulin  Pen Needle (PEN NEEDLES) 33G X 4 MM MISC 1 Needle by Does not apply route in the morning, at noon, in the evening, and at bedtime. 100 each 1 06/27/2024   metFORMIN  (GLUCOPHAGE ) 500 MG tablet Take 2 tablets (1,000 mg total) by mouth 2 (two) times daily with a meal. 120 tablet 5 06/27/2024   Prenatal Vit-Fe Fumarate-FA (MULTIVITAMIN-PRENATAL) 27-0.8 MG TABS tablet Take 1 tablet by mouth at bedtime.   06/27/2024    No Known Allergies  Review of Systems: Negative except for what is mentioned in HPI.  Physical Exam: BP 120/75   Pulse 84   Temp 98.2 F (36.8 C) (Oral)   Resp 17   Ht 5' 4 (1.626 m)   Wt 80.1 kg   LMP 10/11/2023 (Exact Date)   SpO2 98%   BMI 30.31 kg/m  FHR by Doppler: 140 bpm CONSTITUTIONAL: Well-developed, well-nourished female in no acute distress.  HENT:  Normocephalic, atraumatic, External right and left ear normal. Oropharynx is clear and moist EYES: Conjunctivae and EOM are normal. Pupils are equal, round, and reactive to light. No scleral icterus.  NECK: Normal range  of motion, supple, no masses SKIN: Skin is warm and dry. No rash noted. Not diaphoretic. No erythema. No pallor. NEUROLGIC: Alert and oriented to person, place, and time. Normal reflexes, muscle tone coordination. No cranial nerve deficit noted. PSYCHIATRIC: Normal mood and affect. Normal behavior. Normal judgment and thought content. CARDIOVASCULAR: Normal heart rate noted, regular rhythm RESPIRATORY: Effort and breath sounds normal, no problems with respiration noted ABDOMEN: Soft, nontender, nondistended, gravid. Well-healed Pfannenstiel incision. PELVIC: Deferred MUSCULOSKELETAL: Normal range of motion. No edema and no tenderness.    Pertinent Labs/Studies:   Results for orders placed or performed during the hospital encounter of 06/28/24 (from the past  72 hours)  Glucose, capillary     Status: Abnormal   Collection Time: 06/28/24 10:38 AM  Result Value Ref Range   Glucose-Capillary 66 (L) 70 - 99 mg/dL    Comment: Glucose reference range applies only to samples taken after fasting for at least 8 hours.    Assessment and Plan :Shanelle Clontz is a 33 y.o. [id number] at [redacted]w[redacted]d being admitted for scheduled repeat cesarean section. No acute issues today, reviewed recommendation for delivery at 37 weeks with uncontrolled gDM and risks of early term delivery, she is agreeable.   *poorly controlled gDMA2 on metformin  and insulin , EFW 92%tile *unwanted fertility, for BTL  The risks of cesarean section were discussed with the patient; including but not limited to: infection which may require antibiotics; bleeding which may require transfusion or re-operation; injury to bowel, bladder, ureters or other surrounding organs; injury to the fetus; need for additional procedures including hysterectomy in the event of a life-threatening hemorrhage; placental abnormalities wth subsequent pregnancies,  risk of needing c-sections in future pregnancies, incisional problems, thromboembolic phenomenon and other  postoperative/anesthesia complications. Answered all questions. The patient verbalized understanding of the plan, giving informed consent for the procedure. She is agreeable to blood transfusion in the event of emergency.  Reviewed risks of bilateral tubal ligation including infection, hemorrhage, damage to surrounding tissue and organs, risk of regret. Reviewed bilateral tubal ligation failure rate of ~1%, and that there is a slightly higher risk of ectopic after tubal ligation; if she has any reason to believe she is pregnant, she should take a pregnancy test. She understands this is an elective procedure and again affirms her desire. Answered all questions. The patient verbalized understanding of the plan, giving informed consent for the procedure. She is agreeable to blood transfusion in the event of emergency.  Patient has been NPO since midnight, she will remain NPO for procedure Anesthesia and OR aware Preoperative prophylactic antibiotics and SCDs ordered on call to the OR  To OR when ready   K. Yolanda Moats, M.D. Attending Obstetrician & Gynecologist, [facility] for [employer], [facility]

## 2024-06-29 LAB — CBC
HCT: 34.4 % — ABNORMAL LOW (ref 36.0–46.0)
Hemoglobin: 11.6 g/dL — ABNORMAL LOW (ref 12.0–15.0)
MCH: 26.2 pg (ref 26.0–34.0)
MCHC: 33.7 g/dL (ref 30.0–36.0)
MCV: 77.7 fL — ABNORMAL LOW (ref 80.0–100.0)
Platelets: 209 K/uL (ref 150–400)
RBC: 4.43 MIL/uL (ref 3.87–5.11)
RDW: 15.7 % — ABNORMAL HIGH (ref 11.5–15.5)
WBC: 9.4 K/uL (ref 4.0–10.5)
nRBC: 0 % (ref 0.0–0.2)

## 2024-06-29 LAB — GLUCOSE, RANDOM: Glucose, Bld: 100 mg/dL — ABNORMAL HIGH (ref 70–99)

## 2024-06-29 NOTE — Progress Notes (Addendum)
 POSTPARTUM PROGRESS NOTE  Post Operative Day 1  Subjective:  Maureen Cameron is a 33 y.o. [id number] s/p rLTCS with BLTL at [redacted]w[redacted]d.  She reports she is doing well. No acute events overnight. She denies any problems with ambulating, voiding or po intake. Denies nausea or vomiting.  Pain is well controlled.  Lochia is Normal.  Notably fasting BG this AM was 100mg /dL.  Objective: Blood pressure (!) 96/55, pulse 73, temperature 98.1 F (36.7 C), resp. rate 18, height 5' 4 (1.626 m), weight 80.1 kg, last menstrual period 10/11/2023, SpO2 98%, unknown if currently breastfeeding.  BP Readings from Last 3 Encounters:  06/29/24 (!) 96/55  06/27/24 122/76  06/20/24 118/65    Physical Exam:  General: alert, cooperative and no distress Chest: no respiratory distress Heart:regular rate, distal pulses intact Uterine Fundus: firm, appropriately tender DVT Evaluation: No calf swelling or tenderness Extremities: no edema Skin: warm, dry  Recent Labs    06/28/24 1636 06/29/24 0530  HGB 13.7 11.6*  HCT 40.8 34.4*    Assessment/Plan: Maureen Cameron is a 33 y.o. [id number] s/p RCS with BLTL at [redacted]w[redacted]d   POD# 1 - Doing well  Routine postpartum care  Delivery Complications: none and gestational diabetes classA2 Blood Pressure: normal Anemia/Hb Status: Stable, appropriate postpartum Hb, no intervention indicated Contraception: s/p Tubal Ligation Feeding: breast feeding #GDMA2--AM fasting slightly elev, repeat tomorrow. Need 2hr GTT postpartum  Dispo: Plan for discharge tomorrow.   LOS: 1 day   Leeroy KATHEE Pouch, MD OB Fellow  06/29/2024, 4:40 PM

## 2024-06-29 NOTE — Lactation Note (Signed)
 This note was copied from a baby's chart. Lactation Consultation Note  Patient Name: Maureen Cameron Unijb'd Date: 06/29/2024 Age:35 hours Reason for consult: Follow-up assessment;Primapara;Early term 1-38.6wks  LC asked mom how the BF was going that I only saw where she is formula feeding. Mom stated the baby wouldn't latch that he is only formula feed because he won't latch. LC asked mom if I could assist in latch mom stated yes. Mom stated she had difficulty w/her last child BF. LC un-swaddled and took outfit off to keep baby awake. Mom stated he will suck once or twice then go to sleep at the breast. That is exactly what he done w/me assisting. Noted when he cried his tongue is laying low and curls on the side. Assessed suck w/gloved finger. Baby bites, he doesn't extend tongue past gums. Baby has high narrow palate and labial frenulum. LC discussed probably the baby can't get the nipple deep in his mouth so he isn't interested in working at the breast when formula is much easier. Mom stated she goes back to work in 2 months and will be pumping and bottle feeding then as well as BF when she is at home. LC asked mom if I can set up DEBP and get her started on pumping mom agreed. Recommended OP LC appt. Mom has mom cozy pump at home. LC not able to express colostrum at this time. Breast are tender w/hand expression. Mom hasn't seen any colostrum yet.  Maternal Data Has patient been taught Hand Expression?: Yes Does the patient have breastfeeding experience prior to this delivery?: Yes  Feeding Nipple Type: Slow - flow  LATCH Score Latch: Repeated attempts needed to sustain latch, nipple held in mouth throughout feeding, stimulation needed to elicit sucking reflex.  Audible Swallowing: None  Type of Nipple: Everted at rest and after stimulation  Comfort (Breast/Nipple): Soft / non-tender  Hold (Positioning): Assistance needed to correctly position infant at breast and maintain  latch.  LATCH Score: 6   Lactation Tools Discussed/Used Tools: Pump;Flanges Flange Size: 21 Breast pump type: Double-Electric Breast Pump Pump Education: Setup, frequency, and cleaning;Milk Storage Reason for Pumping: baby won't latch well Pumping frequency: q 3hr  Interventions Interventions: Breast feeding basics reviewed;Assisted with latch;Skin to skin;Breast massage;Hand express;Breast compression;Adjust position;Support pillows;Position options;DEBP;Education  Discharge Pump: DEBP  Consult Status Consult Status: Follow-up Date: 06/30/24 Follow-up type: In-patient    Binh Doten G 06/29/2024, 9:52 PM

## 2024-06-30 ENCOUNTER — Encounter (HOSPITAL_COMMUNITY): Payer: Self-pay | Admitting: Obstetrics and Gynecology

## 2024-06-30 ENCOUNTER — Ambulatory Visit

## 2024-06-30 ENCOUNTER — Other Ambulatory Visit

## 2024-06-30 LAB — GLUCOSE, CAPILLARY: Glucose-Capillary: 66 mg/dL — ABNORMAL LOW (ref 70–99)

## 2024-06-30 MED ORDER — DOCUSATE SODIUM 100 MG PO CAPS: 100.0000 mg | ORAL_CAPSULE | Freq: Two times a day (BID) | ORAL | 0 refills | Status: AC

## 2024-06-30 MED ORDER — GABAPENTIN 100 MG PO CAPS: 100.0000 mg | ORAL_CAPSULE | Freq: Three times a day (TID) | ORAL | 1 refills | Status: AC

## 2024-06-30 MED ORDER — ACETAMINOPHEN 500 MG PO TABS
1000.0000 mg | ORAL_TABLET | Freq: Once | ORAL | Status: AC
  Administered 2024-06-30: 1000 mg via ORAL
  Filled 2024-06-30: qty 2

## 2024-06-30 MED ORDER — DOCUSATE SODIUM 100 MG PO CAPS: 100.0000 mg | ORAL_CAPSULE | Freq: Two times a day (BID) | ORAL | Status: DC

## 2024-06-30 MED ORDER — ACETAMINOPHEN 500 MG PO TABS: 1000.0000 mg | ORAL_TABLET | Freq: Once | ORAL | 0 refills | Status: AC

## 2024-06-30 MED ORDER — IBUPROFEN 600 MG PO TABS: 600.0000 mg | ORAL_TABLET | Freq: Four times a day (QID) | ORAL | 0 refills | Status: AC

## 2024-06-30 NOTE — Patient Instructions (Signed)
 Your appointment with Outpatient Lactation is: Date: 07/18/2024 Time: 8:30 AM [facility] (First Floor) [address] [place]  Check in under baby's name.  Please bring your baby hungry along with your pump and a bottle of either formula or expressed breast milk. Please also bring your pump flanges and we welcome support people! If you need lactation assistance before your appointment, please call [phone] and press [phone] for lactation.    Lactation support groups:  [facility], Tuesdays 10:00 am -12:00 pm at [address] on the second floor in the conference room, lactating parents and lap babies welcome.  [email or web address]  [email or web address]    Geraldina Louder, [facility] for [facility]

## 2024-06-30 NOTE — Lactation Note (Signed)
 This note was copied from a baby's chart. Lactation Consultation Note  Patient Name: Maureen Cameron Unijb'd Date: 06/30/2024 Age:35 hours Reason for consult: Follow-up assessment;Maternal discharge;Early term 37-38.6wks  P3, 37 wks, @ 45 hrs of life. Mom feeding breast and bottle by choice. Per mom baby not latching. Discussed differences between breast and bottle, having to keep infant awake/working @ breast. Encouraged hand expression and breast compression to keep baby interested @ breast. Encouraged LC expects breasts to respond easier/faster with each baby, but breast stimulation is key to milk production. Encouraged mom to work @ breast, baby understands what they practice- like learning two different languages. With milk transitioning it may become easier to latch, a small syringe can even be used with breast feeding to get infant started- like a supplemental system.  Encouraged mom to keep working on big mouth latch with baby and use EBM or coconut oil after each feed. Discussed cluster feeding overnight/ early morning brings in our milk supply, shared expectations of milk coming in. Highlighted risk of engorgement. Discussed hand pump/express to soften breasts, motrin  as anti-inflammatory, and ice packs for 10-20 minutes post feed/pumping if still over-full is the best treatments for inflamed/engorged breasts.  Maternal Data Does the patient have breastfeeding experience prior to this delivery?: Yes How long did the patient breastfeed?: 6 months with first child and 8 months with second child  Feeding Mother's Current Feeding Choice: Breast Milk and Formula Nipple Type: Slow - flow  Interventions Interventions: Breast feeding basics reviewed;Hand express;Breast compression;Expressed milk;Hand pump;Education;LC Services brochure;CDC milk storage guidelines  Discharge Discharge Education: Engorgement and breast care Pump: DEBP;Manual;Personal  Consult Status Consult Status:  Complete Date: 06/30/24 Follow-up type: In-patient    [facility] 06/30/2024, 10:38 AM

## 2024-06-30 NOTE — Progress Notes (Signed)
 Subjective: Postpartum Day 2: Cesarean Delivery Patient reports incisional pain, tolerating PO, + flatus, and no problems voiding.    Objective: Vital signs in last 24 hours: Temp:  [97.8 F (36.6 C)-98.5 F (36.9 C)] 98.5 F (36.9 C) (09/15 0530) Pulse Rate:  [72-75] 75 (09/15 0530) Resp:  [18] 18 (09/15 0530) BP: (96-111)/(55-78) 111/78 (09/15 0530)  Physical Exam:  General: alert, cooperative, and appears stated age Lochia: appropriate Uterine Fundus: firm Incision: covered with clean dry and intact with wound vac. No significant drainage noted in wound vac.  DVT Evaluation: No evidence of DVT seen on physical exam. No cords or calf tenderness. No significant calf/ankle edema.  Recent Labs    06/28/24 1636 06/29/24 0530  HGB 13.7 11.6*  HCT 40.8 34.4*    Assessment/Plan: Status post Cesarean section. Doing well postoperatively.  Discharge home with standard precautions and return to clinic in 4-6 weeks. Encouraged frequent movement and stay on top of medication for soreness and gut motility. Patient desires discharge today.   Claris CHRISTELLA Cedar, CNM 06/30/2024, 7:04 AM

## 2024-06-30 NOTE — Discharge Summary (Signed)
 Postpartum Discharge Summary  Date of Service updated     Patient Name: Maureen Cameron DOB: 07/20/1991 MRN: [id number]  Date of admission: 06/28/2024 Delivery date:06/28/2024 Delivering provider: NICHOLAUS BURNARD HERO Date of discharge: 06/30/2024  Admitting diagnosis: Maternal care due to low transverse uterine scar from previous cesarean delivery [O34.211] S/P repeat low transverse C-section [Z98.891] Intrauterine pregnancy: [redacted]w[redacted]d     Secondary diagnosis:  Principal Problem:   S/P repeat low transverse C-section Active Problems:   History of 3 cesarean sections   Hx of tubal ligation  Additional problems:  Patient Active Problem List   Diagnosis Date Noted   S/P repeat low transverse C-section 06/28/2024   History of 3 cesarean sections 06/28/2024   Hx of tubal ligation 06/28/2024   Supervision of high risk pregnancy, antepartum 12/27/2023   Gestational diabetes mellitus (GDM) affecting third pregnancy 07/02/2020   Alpha thalassemia silent carrier 06/04/2020   History of 2 cesarean sections 05/12/2020   Maternal varicella, non-immune 10/04/2015       Discharge diagnosis: Term Pregnancy Delivered and GDM A2                                              Post partum procedures:postpartum tubal ligation Augmentation: N/A Complications: None  Hospital course: Sceduled C/S   33 y.o. yo [id number] at [redacted]w[redacted]d was admitted to the hospital 06/28/2024 for scheduled cesarean section with the following indication:Elective Repeat.Delivery details are as follows:  Membrane Rupture Time/Date:  ,   Delivery Method:C-Section, Low Transverse Operative Delivery:N/A Details of operation can be found in separate operative note.  Patient had a postpartum course uncomplicated.  She is ambulating, tolerating a regular diet, passing flatus, and urinating well. Patient is discharged home in stable condition on  06/30/24        Newborn Data: Birth date:06/28/2024 Birth time:12:48 PM Gender:Female Living  status:Living Apgars:8 ,9  Weight:3160 g    Magnesium  Sulfate received: No BMZ received: No Rhophylac:N/A MMR:N/A T-DaP:Given prenatally Flu: No RSV Vaccine received: No Transfusion:No Immunizations administered: Immunization History  Administered Date(s) Administered   Influenza, Seasonal, Injecte, Preservative Fre 06/20/2024   Influenza,inj,Quad PF,6+ Mos 07/01/2020   Influenza-Unspecified 08/06/2015   MMR 11/22/2015   Tdap 09/20/2015, 07/01/2020, 05/01/2024    Physical exam  Vitals:   06/29/24 0439 06/29/24 1417 06/29/24 2019 06/30/24 0530  BP: (!) 98/56 (!) 96/55 107/65 111/78  Pulse: 60 73 72 75  Resp: 18 18 18 18   Temp: 98.2 F (36.8 C) 98.1 F (36.7 C) 97.8 F (36.6 C) 98.5 F (36.9 C)  TempSrc: Oral  Oral Oral  SpO2: 98%     Weight:      Height:       General: alert, cooperative, and no distress Lochia: appropriate Uterine Fundus: firm Incision: Covered with a clean dry intact wound vac with a good suction seal. No significant drainage.  DVT Evaluation: No evidence of DVT seen on physical exam. No cords or calf tenderness. No significant calf/ankle edema. Labs: Lab Results  Component Value Date   WBC 9.4 06/29/2024   HGB 11.6 (L) 06/29/2024   HCT 34.4 (L) 06/29/2024   MCV 77.7 (L) 06/29/2024   PLT 209 06/29/2024      Latest Ref Rng & Units 06/29/2024    5:30 AM  CMP  Glucose 70 - 99 mg/dL 899    Edinburgh Score:  12/06/2022    3:35 PM  Edinburgh Postnatal Depression Scale Screening Tool  I have been able to laugh and see the funny side of things. 0  I have looked forward with enjoyment to things. 1  I have blamed myself unnecessarily when things went wrong. 2  I have been anxious or worried for no good reason. 1  I have felt scared or panicky for no good reason. 2  Things have been getting on top of me. 0  I have been so unhappy that I have had difficulty sleeping. 2  I have felt sad or miserable. 2  I have been so unhappy that I have  been crying. 1  The thought of harming myself has occurred to me. 0  Edinburgh Postnatal Depression Scale Total 11      Data saved with a previous flowsheet row definition      After visit meds:  Allergies as of 06/30/2024   No Known Allergies      Medication List     TAKE these medications    Accu-Chek Guide Test test strip Generic drug: glucose blood Use as instructed   Accu-Chek Guide w/Device Kit 1 Device by Does not apply route as needed.   Accu-Chek Softclix Lancets lancets Use as instructed   acetaminophen  500 MG tablet Commonly known as: TYLENOL  Take 2 tablets (1,000 mg total) by mouth once for 1 dose.   docusate sodium  100 MG capsule Commonly known as: Colace Take 1 capsule (100 mg total) by mouth 2 (two) times daily.   gabapentin  100 MG capsule Commonly known as: NEURONTIN  Take 1 capsule (100 mg total) by mouth every 8 (eight) hours.   ibuprofen  600 MG tablet Commonly known as: ADVIL  Take 1 tablet (600 mg total) by mouth every 6 (six) hours.   insulin  aspart 100 UNIT/ML FlexTouch Pen Commonly known as: FIASP  Inject 4 Units into the skin daily with supper.   metFORMIN  500 MG tablet Commonly known as: GLUCOPHAGE  Take 2 tablets (1,000 mg total) by mouth 2 (two) times daily with a meal.   multivitamin-prenatal 27-0.8 MG Tabs tablet Take 1 tablet by mouth at bedtime.   Pen Needles 33G X 4 MM Misc 1 Needle by Does not apply route in the morning, at noon, in the evening, and at bedtime.         Discharge home in stable condition Infant Feeding: Bottle Infant Disposition:home with mother Discharge instruction: per After Visit Summary and Postpartum booklet. Activity: Advance as tolerated. Pelvic rest for 6 weeks.  Diet: carb modified diet Anticipated Birth Control: BTL done PP Postpartum Appointment:4 weeks Additional Postpartum F/U: 2 hour GTT and Incision check 2-3 days to assess provena.  Future Appointments: Future Appointments  Date  Time Provider Department Center  06/30/2024 11:15 AM [facility] PROVIDER 1 [facility]  06/30/2024 11:30 AM [facility] US2 [facility]  07/03/2024  1:15 PM [facility] US2 [facility]  07/04/2024  8:15 AM Eveline Lynwood MATSU, MD [facility]  07/10/2024  1:15 PM [facility] US2 [facility]  07/11/2024 10:15 AM Anyanwu, Gloris LABOR, MD [facility]  07/17/2024  1:15 PM [facility] US2 [facility]  07/18/2024  9:35 AM Cresenzo, Norleen GAILS, MD [facility]   Follow up Visit:   Please schedule this patient for a Virtual postpartum visit in 6 weeks with the following provider: Any provider. Additional Postpartum F/U: 2 hr GTT in 4-6 weeks  High risk pregnancy complicated by: A2GDM Delivery mode:  rCS  Anticipated Birth Control: PP BTL  done   Msg sent on 9/15   06/30/2024 Claris CHRISTELLA Cedar, CNM

## 2024-07-01 ENCOUNTER — Other Ambulatory Visit: Payer: Self-pay

## 2024-07-01 DIAGNOSIS — O24419 Gestational diabetes mellitus in pregnancy, unspecified control: Secondary | ICD-10-CM

## 2024-07-02 LAB — SURGICAL PATHOLOGY

## 2024-07-03 ENCOUNTER — Ambulatory Visit: Payer: Self-pay | Admitting: Obstetrics and Gynecology

## 2024-07-03 ENCOUNTER — Other Ambulatory Visit

## 2024-07-03 ENCOUNTER — Encounter: Admitting: Obstetrics and Gynecology

## 2024-07-04 ENCOUNTER — Encounter: Admitting: Obstetrics & Gynecology

## 2024-07-07 ENCOUNTER — Ambulatory Visit (INDEPENDENT_AMBULATORY_CARE_PROVIDER_SITE_OTHER): Admitting: *Deleted

## 2024-07-07 ENCOUNTER — Other Ambulatory Visit

## 2024-07-07 ENCOUNTER — Other Ambulatory Visit: Payer: Self-pay

## 2024-07-07 VITALS — BP 117/76 | HR 88 | Ht 64.0 in | Wt 160.3 lb

## 2024-07-07 DIAGNOSIS — Z4889 Encounter for other specified surgical aftercare: Secondary | ICD-10-CM

## 2024-07-07 DIAGNOSIS — O24419 Gestational diabetes mellitus in pregnancy, unspecified control: Secondary | ICD-10-CM

## 2024-07-07 NOTE — Progress Notes (Addendum)
 Here for incision check. Incision CDI with wound vac. Wound vac removed . Incision CDI with 1/4 inch pink area on left of incision that appears to be slightly open and 1/4 inch on right appears to be slightly open.  Dr. Zina in to view wound and advises no new orders except may apply neosporin if desired.Also advised to place sterstrips which were placed.  I reviewed wound care with patient. I also reviewed postpartum appointment. She voices understanding. Rock Skip PEAK

## 2024-07-08 LAB — GLUCOSE TOLERANCE, 2 HOURS
Glucose, 2 hour: 105 mg/dL (ref 70–139)
Glucose, GTT - Fasting: 70 mg/dL (ref 70–99)

## 2024-07-10 ENCOUNTER — Other Ambulatory Visit

## 2024-07-10 ENCOUNTER — Encounter: Admitting: Obstetrics and Gynecology

## 2024-07-11 ENCOUNTER — Encounter: Admitting: Family Medicine

## 2024-07-11 ENCOUNTER — Encounter: Admitting: Obstetrics & Gynecology

## 2024-07-17 ENCOUNTER — Ambulatory Visit: Payer: Self-pay

## 2024-07-17 ENCOUNTER — Other Ambulatory Visit

## 2024-07-18 ENCOUNTER — Encounter: Admitting: Family Medicine

## 2024-07-30 ENCOUNTER — Ambulatory Visit: Admitting: Certified Nurse Midwife

## 2024-07-30 ENCOUNTER — Encounter: Payer: Self-pay | Admitting: Certified Nurse Midwife

## 2024-07-30 ENCOUNTER — Other Ambulatory Visit: Payer: Self-pay

## 2024-07-30 NOTE — Progress Notes (Deleted)
    Post Partum Visit Note  Maureen Cameron is a 33 y.o. [phone] female who presents for a postpartum visit. She is 4.4 weeks postpartum following a repeat cesarean section.  I have fully reviewed the prenatal and intrapartum course. The delivery was at 37.2 gestational weeks.  Anesthesia: spinal. Postpartum course has been good. Baby is doing well. Baby is feeding by both breast and bottle - Similac Advance. Bleeding staining only. Bowel function is normal. Bladder function is normal. Patient is not sexually active. Contraception method is tubal ligation. Postpartum depression screening: negative.   The pregnancy intention screening data noted above was reviewed. Potential methods of contraception were discussed. The patient elected to proceed with No data recorded.    Health Maintenance Due  Topic Date Due   FOOT EXAM  Never done   OPHTHALMOLOGY EXAM  Never done   Diabetic kidney evaluation - Urine ACR  Never done   Pneumococcal Vaccine (1 of 2 - PCV) Never done   Hepatitis B Vaccines 19-59 Average Risk (1 of 3 - 19+ 3-dose series) Never done   HPV VACCINES (1 - 3-dose SCDM series) Never done   COVID-19 Vaccine (1 - 2025-26 season) Never done   HEMOGLOBIN A1C  07/05/2024    {Common ambulatory SmartLinks:19316}  Review of Systems {ros; complete:30496}  Objective:  LMP 10/11/2023 (Exact Date)    General:  {gen appearance:16600}   Breasts:  {desc; normal/abnormal/not indicated:14647}  Lungs: {lung exam:16931}  Heart:  {heart exam:5510}  Abdomen: {abdomen exam:16834}   Wound {Wound assessment:11097}  GU exam:  {desc; normal/abnormal/not indicated:14647}       Assessment:    There are no diagnoses linked to this encounter.  *** postpartum exam.   Plan:   Essential components of care per ACOG recommendations:  1.  Mood and well being: Patient with {gen negative/positive:315881} depression screening today. Reviewed local resources for support.  - Patient tobacco use?  {tobacco use:25506}  - hx of drug use? {yes/no:25505}    2. Infant care and feeding:  -Patient currently breastmilk feeding? {yes/no:25502}  -Social determinants of health (SDOH) reviewed in EPIC. No concerns***The following needs were identified***  3. Sexuality, contraception and birth spacing - Patient {DOES_DOES WNU:81435} want a pregnancy in the next year.  Desired family size is {NUMBER 1-10:22536} children.  - Reviewed reproductive life planning. Reviewed contraceptive methods based on pt preferences and effectiveness.  Patient desired {Upstream End Methods:24109} today.   - Discussed birth spacing of 18 months  4. Sleep and fatigue -Encouraged family/partner/community support of 4 hrs of uninterrupted sleep to help with mood and fatigue  5. Physical Recovery  - Discussed patients delivery and complications. She describes her labor as {description:25511} - Patient had a {CHL AMB DELIVERY:4702371920}. Patient had a {laceration:25518} laceration. Perineal healing reviewed. Patient expressed understanding - Patient has urinary incontinence? {yes/no:25515} - Patient {ACTION; IS/IS WNU:78978602} safe to resume physical and sexual activity  6.  Health Maintenance - HM due items addressed {Yes or If no, why not?:20788} - Last pap smear  Diagnosis  Date Value Ref Range Status  04/14/2020   Final   - Negative for intraepithelial lesion or malignancy (NILM)   Pap smear {done:10129} at today's visit.  -Breast Cancer screening indicated? {indicated:25516}  7. Chronic Disease/Pregnancy Condition follow up: {Follow up:25499}  - PCP follow up  B'Aisha ONEIDA Lee, CMA Center for [employer], [facility]

## 2024-07-30 NOTE — Progress Notes (Signed)
    Post Partum Visit Note  Maureen Cameron is a 33 y.o. [phone] female who presents for a postpartum visit. She is 4.4 weeks postpartum following a repeat cesarean section.  I have fully reviewed the prenatal and intrapartum course. The delivery was at 37.2 gestational weeks.  Anesthesia: spinal. Postpartum course has been good. Baby is doing well. Baby is feeding by both breast and bottle - Similac Advance. Bleeding staining only. Bowel function is normal. Bladder function is normal. Patient is not sexually active. Contraception method is tubal ligation. Postpartum depression screening: negative.   The pregnancy intention screening data noted above was reviewed. Potential methods of contraception were discussed.    Health Maintenance Due  Topic Date Due   FOOT EXAM  Never done   OPHTHALMOLOGY EXAM  Never done   Diabetic kidney evaluation - Urine ACR  Never done   Pneumococcal Vaccine (1 of 2 - PCV) Never done   Hepatitis B Vaccines 19-59 Average Risk (1 of 3 - 19+ 3-dose series) Never done   HPV VACCINES (1 - 3-dose SCDM series) Never done   COVID-19 Vaccine (1 - 2025-26 season) Never done   HEMOGLOBIN A1C  07/05/2024    The following portions of the patient's history were reviewed and updated as appropriate: allergies, current medications, past family history, past medical history, past social history, past surgical history, and problem list.  Review of Systems Pertinent items are noted in HPI.  Objective:  BP 117/83   Pulse 84   Wt 155 lb 3.2 oz (70.4 kg)   LMP 10/11/2023 (Exact Date)   Breastfeeding Yes   BMI 26.64 kg/m    General:  alert and cooperative   Breasts:  not indicated  Lungs: Normal effort  Abdomen: normal findings: soft, non-tender   Wound well approximated incision  GU exam:  not indicated       Assessment:  1. Encounter for routine postpartum follow-up (Primary)   Plan:   Essential components of care per ACOG recommendations:  1.  Mood and well  being: Patient with negative depression screening today. Reviewed local resources for support.  - Patient tobacco use? No.   - hx of drug use? No.    2. Infant care and feeding:  -Patient currently breast and bottle feeding. -Social determinants of health (SDOH) reviewed in EPIC. No concerns  3. Sexuality, contraception and birth spacing - Patient received tubal ligation - Patient does not want a pregnancy in the next year. - Reviewed reproductive life planning. Reviewed contraceptive methods based on pt preferences and effectiveness.   -  4. Sleep and fatigue -Encouraged family/partner/community support of 4 hrs of uninterrupted sleep to help with mood and fatigue  5. Physical Recovery  - Discussed patients delivery and complications.  - Patient had a C-section repeat; no problems after deliver.  - Patient has urinary incontinence? No. - Patient is safe to resume physical and sexual activity  6.  Health Maintenance - HM due items addressed Yes - Last pap smear  Diagnosis  Date Value Ref Range Status  04/14/2020   Final   - Negative for intraepithelial lesion or malignancy (NILM)   Pap smear not done at today's visit. Plan to reschedule for a later date. -Breast Cancer screening indicated? No.   7. Chronic Disease/Pregnancy Condition follow up: None  - PCP follow up  Tommy Daring, NP-S

## 2024-08-01 ENCOUNTER — Ambulatory Visit

## 2024-08-18 DIAGNOSIS — Z0289 Encounter for other administrative examinations: Secondary | ICD-10-CM

## 2024-10-12 ENCOUNTER — Ambulatory Visit
Admission: EM | Admit: 2024-10-12 | Discharge: 2024-10-12 | Disposition: A | Discharge status: Home / Self Care | Attending: Family Medicine | Admitting: Family Medicine

## 2024-10-12 DIAGNOSIS — J019 Acute sinusitis, unspecified: Secondary | ICD-10-CM | POA: Diagnosis not present

## 2024-10-12 DIAGNOSIS — B9789 Other viral agents as the cause of diseases classified elsewhere: Secondary | ICD-10-CM

## 2024-10-12 MED ORDER — PREDNISONE 20 MG PO TABS: 40.0000 mg | ORAL_TABLET | Freq: Every day | ORAL | 0 refills | Status: AC

## 2024-10-12 MED ORDER — FLUTICASONE PROPIONATE 50 MCG/ACT NA SUSP: 1.0000 | Freq: Every day | NASAL | 0 refills | Status: AC

## 2024-10-12 NOTE — Discharge Instructions (Signed)
 Start Flonase  nasal spray daily.  Also start prednisone  daily for 5 days.  Nasal rinses such as Merrilyn Sieve as you tolerate.  You may take over-the-counter Tylenol  or ibuprofen  as needed.  Lots of rest and fluids and follow-up with your PCP in 2 to 3 days for recheck.  Please go to the emergency room for any worsening symptoms.  I hope you feel better soon!

## 2024-10-12 NOTE — ED Triage Notes (Signed)
 Pt states congestion and sinus pressure in her head for the past 4 days. States she has been taking OTC sinus medication at home.

## 2024-10-12 NOTE — ED Provider Notes (Signed)
" [facility]    CSN: [id number] Arrival date & time: 10/12/24  0803      History   Chief Complaint Chief Complaint  Patient presents with   Nasal Congestion    HPI Maureen Cameron is a 33 y.o. female  presents for evaluation of URI symptoms for 4 days. Patient reports associated symptoms of nasal congestion/sinus pressure with subjective fever. Denies N/V/D, cough, sore throat, ear pain, body aches, shortness of breath. Patient does not have a hx of asthma. Patient is not an active smoker.   Reports  sick contacts via daughter.  Pt has taken sinus medication OTC for symptoms.  Denies pregnancy or breast-feeding.  Pt has no other concerns at this time.   HPI  Past Medical History:  Diagnosis Date   Gestational diabetes     Patient Active Problem List   Diagnosis Date Noted   History of 3 cesarean sections 06/28/2024   Hx of tubal ligation 06/28/2024   Alpha thalassemia silent carrier 06/04/2020   Maternal varicella, non-immune 10/04/2015    Past Surgical History:  Procedure Laterality Date   BILATERAL SALPINGECTOMY Bilateral 06/28/2024   Procedure: SALPINGECTOMY, BILATERAL, OPEN;  Surgeon: Nicholaus Burnard HERO, MD;  Location: [facility];  Service: Gynecology;  Laterality: Bilateral;   CESAREAN SECTION N/A 11/19/2015   Procedure: CESAREAN SECTION;  Surgeon: Glenys GORMAN Birk, MD;  Location: [facility];  Service: Obstetrics;  Laterality: N/A;   CESAREAN SECTION N/A 09/11/2020   Procedure: REPEAT CESAREAN SECTION;  Surgeon: Herchel Gloris LABOR, MD;  Location: [facility];  Service: Obstetrics;  Laterality: N/A;   CESAREAN SECTION N/A 06/28/2024   Procedure: CESAREAN DELIVERY;  Surgeon: Nicholaus Burnard HERO, MD;  Location: [facility];  Service: Obstetrics;  Laterality: N/A;    OB History     Gravida  4   Para  3   Term  3   Preterm  0   AB  1   Living  3      SAB  1   IAB  0   Ectopic  0   Multiple  0   Live Births  3            Home Medications    Prior to  Admission medications  Medication Sig Start Date End Date Taking? Authorizing Provider  fluticasone  (FLONASE ) 50 MCG/ACT nasal spray Place 1 spray into both nostrils daily. 10/12/24  Yes Tiffani Kadow, Jodi R, NP  predniSONE  (DELTASONE ) 20 MG tablet Take 2 tablets (40 mg total) by mouth daily with breakfast for 5 days. 10/12/24 10/17/24 Yes Loreda Myla SAUNDERS, NP  Accu-Chek Softclix Lancets lancets Use as instructed Patient not taking: Reported on 07/07/2024 05/06/24   Forlan, Nicole, NP  Acetaminophen  Extra Strength 500 MG TABS Take 2 tablets by mouth once. Patient not taking: Reported on 07/30/2024 06/30/24   [provider]  Blood Glucose Monitoring Suppl (ACCU-CHEK GUIDE) w/Device KIT 1 Device by Does not apply route as needed. Patient not taking: Reported on 07/07/2024 05/06/24   Forlan, Nicole, NP  docusate sodium  (COLACE) 100 MG capsule Take 1 capsule (100 mg total) by mouth 2 (two) times daily. Patient not taking: Reported on 07/07/2024 06/30/24   Emilio Delilah HERO, CNM  gabapentin  (NEURONTIN ) 100 MG capsule Take 1 capsule (100 mg total) by mouth every 8 (eight) hours. Patient not taking: Reported on 07/30/2024 06/30/24   Emilio Delilah HERO, CNM  glucose blood (ACCU-CHEK GUIDE TEST) test strip Use as instructed Patient not taking:  Reported on 07/07/2024 05/06/24   Forlan, Nicole, NP  ibuprofen  (ADVIL ) 600 MG tablet Take 1 tablet (600 mg total) by mouth every 6 (six) hours. Patient not taking: Reported on 07/30/2024 06/30/24   Emilio Delilah HERO, CNM  insulin  aspart (FIASP ) 100 UNIT/ML FlexTouch Pen Inject 4 Units into the skin daily with supper. Patient not taking: Reported on 07/07/2024 06/13/24   Eveline Lynwood MATSU, MD  Insulin  Pen Needle (PEN NEEDLES) 33G X 4 MM MISC 1 Needle by Does not apply route in the morning, at noon, in the evening, and at bedtime. Patient not taking: Reported on 07/07/2024 06/18/24   Eveline Lynwood MATSU, MD  metFORMIN  (GLUCOPHAGE ) 500 MG tablet Take 2 tablets (1,000 mg total) by  mouth 2 (two) times daily with a meal. Patient not taking: Reported on 07/07/2024 05/30/24   Lola Donnice HERO, MD  Prenatal Vit-Fe Fumarate-FA (MULTIVITAMIN-PRENATAL) 27-0.8 MG TABS tablet Take 1 tablet by mouth at bedtime.    [provider]    Family History Family History  Problem Relation Age of Onset   Heart disease Mother    Hypertension Mother    Diabetes Mother    Hypertension Father     Social History Social History[1]   Allergies   Patient has no known allergies.   Review of Systems Review of Systems  Constitutional:  Positive for fever.  HENT:  Positive for congestion, sinus pressure and sinus pain.      Physical Exam Triage Vital Signs ED Triage Vitals  Encounter Vitals Group     BP 10/12/24 0839 112/73     Girls Systolic BP Percentile --      Girls Diastolic BP Percentile --      Boys Systolic BP Percentile --      Boys Diastolic BP Percentile --      Pulse Rate 10/12/24 0839 75     Resp 10/12/24 0839 16     Temp 10/12/24 0839 97.8 F (36.6 C)     Temp Source 10/12/24 0839 Oral     SpO2 10/12/24 0839 97 %     Weight --      Height --      Head Circumference --      Peak Flow --      Pain Score 10/12/24 0848 5     Pain Loc --      Pain Education --      Exclude from Growth Chart --    No data found.  Updated Vital Signs BP 112/73 (BP Location: Left Arm)   Pulse 75   Temp 97.8 F (36.6 C) (Oral)   Resp 16   LMP 09/30/2024 (Approximate)   SpO2 97%   Breastfeeding No   Visual Acuity Right Eye Distance:   Left Eye Distance:   Bilateral Distance:    Right Eye Near:   Left Eye Near:    Bilateral Near:     Physical Exam Vitals and nursing note reviewed.  Constitutional:      General: She is not in acute distress.    Appearance: She is well-developed. She is not ill-appearing.  HENT:     Head: Normocephalic and atraumatic.     Right Ear: Tympanic membrane and ear canal normal.     Left Ear: Tympanic membrane and ear canal  normal.     Nose: Congestion present.     Right Turbinates: Not swollen or pale.     Left Turbinates: Not swollen or pale.     Right  Sinus: Frontal sinus tenderness present. No maxillary sinus tenderness.     Left Sinus: Maxillary sinus tenderness and frontal sinus tenderness present.     Mouth/Throat:     Mouth: Mucous membranes are moist.     Pharynx: Oropharynx is clear. Uvula midline. No posterior oropharyngeal erythema.     Tonsils: No tonsillar exudate or tonsillar abscesses.  Eyes:     Conjunctiva/sclera: Conjunctivae normal.     Pupils: Pupils are equal, round, and reactive to light.  Cardiovascular:     Rate and Rhythm: Normal rate and regular rhythm.     Heart sounds: Normal heart sounds.  Pulmonary:     Effort: Pulmonary effort is normal.     Breath sounds: Normal breath sounds. No wheezing, rhonchi or rales.  Musculoskeletal:     Cervical back: Normal range of motion and neck supple.  Lymphadenopathy:     Cervical: No cervical adenopathy.  Skin:    General: Skin is warm and dry.  Neurological:     General: No focal deficit present.     Mental Status: She is alert and oriented to person, place, and time.  Psychiatric:        Mood and Affect: Mood normal.        Behavior: Behavior normal.      UC Treatments / Results  Labs (all labs ordered are listed, but only abnormal results are displayed) Labs Reviewed - No data to display  EKG   Radiology No results found.  Procedures Procedures (including critical care time)  Medications Ordered in UC Medications - No data to display  Initial Impression / Assessment and Plan / UC Course  I have reviewed the triage vital signs and the nursing notes.  Pertinent labs & imaging results that were available during my care of the patient were reviewed by me and considered in my medical decision making (see chart for details).     Reviewed exam and symptoms with patient.  She declines COVID testing.  Discussed viral  sinusitis, do trial of prednisone  and Flonase  for symptom management.  Encouraged nasal rinses as tolerated and may continue OTC Tylenol  or ibuprofen  as needed.  PCP follow-up 2 to 3 days for recheck.  ER precautions reviewed Final Clinical Impressions(s) / UC Diagnoses   Final diagnoses:  Acute viral sinusitis     Discharge Instructions      Start Flonase  nasal spray daily.  Also start prednisone  daily for 5 days.  Nasal rinses such as Merrilyn Sieve as you tolerate.  You may take over-the-counter Tylenol  or ibuprofen  as needed.  Lots of rest and fluids and follow-up with your PCP in 2 to 3 days for recheck.  Please go to the emergency room for any worsening symptoms.  I hope you feel better soon!    ED Prescriptions     Medication Sig Dispense Auth. Provider   fluticasone  (FLONASE ) 50 MCG/ACT nasal spray Place 1 spray into both nostrils daily. 15.8 mL Christie Viscomi, Jodi R, NP   predniSONE  (DELTASONE ) 20 MG tablet Take 2 tablets (40 mg total) by mouth daily with breakfast for 5 days. 10 tablet Shravan Salahuddin, Jodi R, NP      PDMP not reviewed this encounter.    [1]  Social History Tobacco Use   Smoking status: Never   Smokeless tobacco: Never  Vaping Use   Vaping status: Never Used  Substance Use Topics   Alcohol use: No   Drug use: No     Loreda Myla SAUNDERS, NP 10/12/24  0909 ° °"
